# Patient Record
Sex: Female | Born: 1990 | Race: Black or African American | Hispanic: No | Marital: Single | Smoking: Former smoker
Health system: Southern US, Community
[De-identification: ages and names within clinical notes are randomized; demographics above are authoritative.]

## PROBLEM LIST (undated history)

## (undated) ENCOUNTER — Inpatient Hospital Stay (HOSPITAL_COMMUNITY): Payer: Self-pay

## (undated) DIAGNOSIS — E669 Obesity, unspecified: Secondary | ICD-10-CM

## (undated) DIAGNOSIS — F32A Depression, unspecified: Secondary | ICD-10-CM

## (undated) DIAGNOSIS — D649 Anemia, unspecified: Secondary | ICD-10-CM

## (undated) DIAGNOSIS — G43909 Migraine, unspecified, not intractable, without status migrainosus: Secondary | ICD-10-CM

## (undated) DIAGNOSIS — K5909 Other constipation: Secondary | ICD-10-CM

## (undated) DIAGNOSIS — F329 Major depressive disorder, single episode, unspecified: Secondary | ICD-10-CM

## (undated) DIAGNOSIS — G8929 Other chronic pain: Secondary | ICD-10-CM

## (undated) DIAGNOSIS — A549 Gonococcal infection, unspecified: Secondary | ICD-10-CM

## (undated) DIAGNOSIS — Z8619 Personal history of other infectious and parasitic diseases: Secondary | ICD-10-CM

## (undated) DIAGNOSIS — Z87440 Personal history of urinary (tract) infections: Secondary | ICD-10-CM

## (undated) DIAGNOSIS — A599 Trichomoniasis, unspecified: Secondary | ICD-10-CM

## (undated) DIAGNOSIS — I10 Essential (primary) hypertension: Secondary | ICD-10-CM

## (undated) DIAGNOSIS — A749 Chlamydial infection, unspecified: Secondary | ICD-10-CM

## (undated) HISTORY — DX: Other constipation: K59.09

## (undated) HISTORY — PX: WISDOM TOOTH EXTRACTION: SHX21

## (undated) HISTORY — DX: Personal history of urinary (tract) infections: Z87.440

## (undated) HISTORY — DX: Obesity, unspecified: E66.9

## (undated) HISTORY — DX: Major depressive disorder, single episode, unspecified: F32.9

## (undated) HISTORY — DX: Other chronic pain: G89.29

## (undated) HISTORY — DX: Depression, unspecified: F32.A

## (undated) HISTORY — DX: Personal history of other infectious and parasitic diseases: Z86.19

## (undated) HISTORY — PX: INDUCED ABORTION: SHX677

## (undated) HISTORY — PX: REMOVAL OF IMPLANON ROD: OBO 1006

## (undated) HISTORY — DX: Trichomoniasis, unspecified: A59.9

## (undated) HISTORY — DX: Essential (primary) hypertension: I10

---
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
## 2011-01-05 ENCOUNTER — Other Ambulatory Visit: Payer: Self-pay | Admitting: Family Medicine

## 2011-01-05 ENCOUNTER — Inpatient Hospital Stay (INDEPENDENT_AMBULATORY_CARE_PROVIDER_SITE_OTHER)
Admission: RE | Admit: 2011-01-05 | Discharge: 2011-01-05 | Disposition: A | Discharge status: Home / Self Care | Payer: Medicaid Other | Source: Ambulatory Visit | Attending: Family Medicine | Admitting: Family Medicine

## 2011-01-05 DIAGNOSIS — N899 Noninflammatory disorder of vagina, unspecified: Secondary | ICD-10-CM

## 2011-01-05 LAB — POCT URINALYSIS DIP (DEVICE)
Ketones, ur: NEGATIVE mg/dL
Protein, ur: 30 mg/dL — AB
Specific Gravity, Urine: 1.01 (ref 1.005–1.030)

## 2011-01-05 LAB — WET PREP, GENITAL
Trich, Wet Prep: NONE SEEN
Yeast Wet Prep HPF POC: NONE SEEN

## 2011-01-06 LAB — GC/CHLAMYDIA PROBE AMP, GENITAL
Chlamydia, DNA Probe: NEGATIVE
GC Probe Amp, Genital: NEGATIVE

## 2011-01-17 ENCOUNTER — Emergency Department (HOSPITAL_COMMUNITY)
Admission: EM | Admit: 2011-01-17 | Discharge: 2011-01-17 | Disposition: A | Discharge status: Home / Self Care | Payer: Medicaid Other | Attending: Emergency Medicine | Admitting: Emergency Medicine

## 2011-01-17 DIAGNOSIS — J029 Acute pharyngitis, unspecified: Secondary | ICD-10-CM | POA: Insufficient documentation

## 2011-01-17 DIAGNOSIS — J45909 Unspecified asthma, uncomplicated: Secondary | ICD-10-CM | POA: Insufficient documentation

## 2011-01-22 ENCOUNTER — Emergency Department (HOSPITAL_COMMUNITY)
Admission: EM | Admit: 2011-01-22 | Discharge: 2011-01-22 | Disposition: A | Discharge status: Home / Self Care | Payer: Medicaid Other | Attending: Emergency Medicine | Admitting: Emergency Medicine

## 2011-01-22 DIAGNOSIS — R059 Cough, unspecified: Secondary | ICD-10-CM | POA: Insufficient documentation

## 2011-01-22 DIAGNOSIS — R0609 Other forms of dyspnea: Secondary | ICD-10-CM | POA: Insufficient documentation

## 2011-01-22 DIAGNOSIS — R05 Cough: Secondary | ICD-10-CM | POA: Insufficient documentation

## 2011-01-22 DIAGNOSIS — B9789 Other viral agents as the cause of diseases classified elsewhere: Secondary | ICD-10-CM | POA: Insufficient documentation

## 2011-01-22 DIAGNOSIS — R5381 Other malaise: Secondary | ICD-10-CM | POA: Insufficient documentation

## 2011-01-22 DIAGNOSIS — N390 Urinary tract infection, site not specified: Secondary | ICD-10-CM | POA: Insufficient documentation

## 2011-01-22 DIAGNOSIS — J45909 Unspecified asthma, uncomplicated: Secondary | ICD-10-CM | POA: Insufficient documentation

## 2011-01-22 DIAGNOSIS — R112 Nausea with vomiting, unspecified: Secondary | ICD-10-CM | POA: Insufficient documentation

## 2011-01-22 DIAGNOSIS — R6883 Chills (without fever): Secondary | ICD-10-CM | POA: Insufficient documentation

## 2011-01-22 DIAGNOSIS — M549 Dorsalgia, unspecified: Secondary | ICD-10-CM | POA: Insufficient documentation

## 2011-01-22 DIAGNOSIS — R0982 Postnasal drip: Secondary | ICD-10-CM | POA: Insufficient documentation

## 2011-01-22 DIAGNOSIS — J3489 Other specified disorders of nose and nasal sinuses: Secondary | ICD-10-CM | POA: Insufficient documentation

## 2011-01-22 DIAGNOSIS — R000 Tachycardia, unspecified: Secondary | ICD-10-CM | POA: Insufficient documentation

## 2011-01-22 DIAGNOSIS — R5383 Other fatigue: Secondary | ICD-10-CM | POA: Insufficient documentation

## 2011-01-22 DIAGNOSIS — R0989 Other specified symptoms and signs involving the circulatory and respiratory systems: Secondary | ICD-10-CM | POA: Insufficient documentation

## 2011-01-22 DIAGNOSIS — R070 Pain in throat: Secondary | ICD-10-CM | POA: Insufficient documentation

## 2011-01-22 LAB — URINALYSIS, ROUTINE W REFLEX MICROSCOPIC
Glucose, UA: NEGATIVE mg/dL
Specific Gravity, Urine: 1.008 (ref 1.005–1.030)

## 2011-01-22 LAB — URINE MICROSCOPIC-ADD ON

## 2011-01-22 LAB — POCT PREGNANCY, URINE: Preg Test, Ur: NEGATIVE

## 2011-02-27 NOTE — H&P (Signed)
Margaret Cline, Margaret Cline                ACCOUNT NO.:  [id number]   MEDICAL RECORD NO.:  [id number]          PATIENT TYPE:  INP   LOCATION:  9165                          FACILITY:  [facility]   PHYSICIAN:  Janine Limbo, M.D.DATE OF BIRTH:  Apr 04, 1991   DATE OF ADMISSION:  05/26/2007  DATE OF DISCHARGE:                              HISTORY & PHYSICAL   Margaret Cline is a 20 year old gravida 1 para 0 at 17 and 3/7 weeks who  presented complaining of spontaneous ruptured membranes at approximately  9:50 p.m. tonight, with questionable light yellow tinge to fluid.  She  denies uterine contractions and reports positive fetal movement.  Pregnancy is remarkable for:  1. Age 69.  2. First trimester chlamydia with a negative test of cure.  3. Group B strep negative.   PRENATAL LABS:  Blood type is O positive, RH antibody negative, VDRL  nonreactive, Rubella titer positive, hepatitis B surface antigen  negative, HIV was nonreactive, varicella titer was negative, cystic  fibrosis testing was declined, sickle cell test was negative.  GC and  chlamydia cultures in the first trimester: GC was negative, chlamydia  was positive.  Followup test of cure at 19 weeks was negative and  negative GC  and chlamydia cultures were noted at 36 weeks.  She also  had a negative group B strep culture.  She was too late for care for  first trimester screening, quadruple screen was not done.  GC and  chlamydia cultures were negative again at 25 weeks.  She had a normal 1-  hour Glucola, hemoglobin upon entering to practice was 11.7.   HISTORY OF PRESENT PREGNANCY:  The patient entered care at approximately  13 weeks.  She had had an early ultrasound in December for dating  purposes and gave an EDC of 05/23/2007.  Varicella titer was done at her  first visit secondary to question of history.  This was noted to be  negative so she is therefore non-immune to varicella.  She had chlamydia  diagnosed at  her new OB visit.  She  was treated but threw it up.  She  then repeated it and was able to take it without vomiting.  Test of cure  was done and was negative.  She had an ultrasound at 19 weeks for normal  growth and development with an anterior placenta.  She was treated for  BV at 19 weeks.  She was having some significant stress in her home life  at 23 weeks.  Evidently her uncle has formal custody but the patient was  not having any access to any funds for herself.  The patient generally  stays with her mother, although her mother does not have formal custody.  She had some itching at 25 weeks.  GC and chlamydia cultures were  negative at that time.  She was having some back spasms at 27 weeks.  She had normal Glucola.  She had some irregular contractions at 33  weeks.  She had negative group B strep and negative cultures at 36 to 37  weeks.  She was seen in  the office this week and cervix was noted to be  a loose 1 cm.   OBSTETRICAL HISTORY:  The patient is a primigravida.   MEDICAL HISTORY:  She is a previous condom user.  She was diagnosed with  an abnormal Pap in December of 2007.  This was evidently attributed to  being treated for chlamydia and Trichomonas.  Both of these were noted  to be cleared.  She reports usual childhood illnesses.  She did not have  a history of chicken pox.  Varicella titer was done which was negative.  She did have cyst under her right arm that had to be removed in the  past.   ALLERGIES:  No known drug allergies.   FAMILY HISTORY:  There is a strong family history of hypertension on her  mother's side.  The patient's mother has had a history of stroke.  Her  sister has a history of depression.  The patient's uncle, mother and  sister are smokers.  Genetic history is unremarkable.   SOCIAL HISTORY:  The patient is single.  The father of the baby has been  sporadically involved.  The patient is African-American and of Christian  faith.  She is a 9th grade student.  She  has been living with her mother  although her uncle has custody.  There have been some issues with  financial support for this patient.  Her partner has an 8th grade  education.  The patient denies any alcohol, drug or tobacco use during  this pregnancy.  She has been followed by the Certified Nurse Midwife  service and [facility].   PHYSICAL EXAMINATION:  Vital signs are stable.  The patient is afebrile.  HEENT:  Within normal limits.  LUNGS:  Breath sounds are clear.  HEART:  Regular rate and rhythm, without murmur.  BREASTS:  Soft and nontender  ABDOMEN:  Fundal height is approximately 38 cm.  Estimated fetal weight  is 7-8 pounds.  Uterine contractions are very occasional and mild.  The  patient is noted to be leaking a small amount of yellowish amniotic  fluid.  CERVICAL EXAM:  1 cm 62% vertex and a -2 station.  Fetal heart rate is  reassuring with 10 beat accelerations noted.  No decelerations are  noted.  There are very occasional uterine contractions noted that are  very mild.  EXTREMITIES:  Deep tendon reflexes are 2+ without clonus.  There is a  trace edema noted.   IMPRESSION:  1. Intrauterine pregnancy at 40 and 3/7 weeks.  2. Spontaneous ruptured membranes prior to the onset of labor.  3. Light meconium stained fluid.  4. Group B strep negative.   PLAN:  1. Admit birthing suite for consult with Dr. Stefano Gaul as attending      physician.  2. Routine Certified Nurse Midwife orders.  3. Plan to observe for labor onset through the night, but if no change      by the morning we will plan Pitocin augmentation.  4. The patient prefers to use IV pain medication versus an epidural as      labor progresses.  5. She does have a Development worker, international aid.  This doula will come with the      onset of more active labor.      Renaldo Reel Emilee Hero, C.N.M.      Janine Limbo, M.D.  Electronically Signed    VLL/MEDQ  D:  05/26/2007  T:  05/27/2007  Job:  [id number]

## 2011-07-18 ENCOUNTER — Emergency Department (HOSPITAL_COMMUNITY)
Admission: EM | Admit: 2011-07-18 | Discharge: 2011-07-19 | Discharge status: Against Medical Advice | Payer: Medicaid Other | Source: Home / Self Care | Attending: Emergency Medicine | Admitting: Emergency Medicine

## 2011-07-18 DIAGNOSIS — IMO0001 Reserved for inherently not codable concepts without codable children: Secondary | ICD-10-CM | POA: Insufficient documentation

## 2011-07-18 DIAGNOSIS — R0989 Other specified symptoms and signs involving the circulatory and respiratory systems: Secondary | ICD-10-CM | POA: Insufficient documentation

## 2011-07-18 DIAGNOSIS — R51 Headache: Secondary | ICD-10-CM | POA: Insufficient documentation

## 2011-07-19 ENCOUNTER — Ambulatory Visit (HOSPITAL_COMMUNITY)
Admission: EM | Admit: 2011-07-19 | Discharge: 2011-07-19 | Discharge status: Against Medical Advice | Payer: Medicaid Other | Attending: Emergency Medicine | Admitting: Emergency Medicine

## 2011-07-19 DIAGNOSIS — IMO0001 Reserved for inherently not codable concepts without codable children: Secondary | ICD-10-CM | POA: Insufficient documentation

## 2011-07-19 DIAGNOSIS — R51 Headache: Secondary | ICD-10-CM | POA: Insufficient documentation

## 2011-07-30 LAB — CBC
HCT: 28.8 — ABNORMAL LOW
Hemoglobin: 10.6 — ABNORMAL LOW
MCHC: 32.9
MCV: 82.9
MCV: 85
Platelets: 227
RBC: 3.87
RDW: 16.3 — ABNORMAL HIGH
RDW: 16.4 — ABNORMAL HIGH

## 2011-08-02 LAB — URINALYSIS, ROUTINE W REFLEX MICROSCOPIC
Bilirubin Urine: NEGATIVE
Ketones, ur: NEGATIVE
Nitrite: NEGATIVE
Protein, ur: NEGATIVE
Urobilinogen, UA: 1

## 2011-08-15 ENCOUNTER — Inpatient Hospital Stay (HOSPITAL_COMMUNITY)
Admission: AD | Admit: 2011-08-15 | Discharge: 2011-08-15 | Disposition: A | Discharge status: Home / Self Care | Payer: Medicaid Other | Source: Ambulatory Visit | Attending: Obstetrics and Gynecology | Admitting: Obstetrics and Gynecology

## 2011-08-15 ENCOUNTER — Encounter (HOSPITAL_COMMUNITY): Payer: Self-pay | Admitting: *Deleted

## 2011-08-15 DIAGNOSIS — N912 Amenorrhea, unspecified: Secondary | ICD-10-CM | POA: Insufficient documentation

## 2011-08-15 NOTE — ED Provider Notes (Signed)
History     CSN: [id number] Arrival date & time: 08/15/2011  9:23 PM   None     Chief Complaint  Patient presents with  . Possible Pregnancy    HPI Margaret Cline is a 20 y.o. female who presents to [facility] for pregnancy test. She took a HPT and it was negative. Her PCP has referred her to a GYN but the appointment is a few weeks away and she wanted to come and have the test repeated. She denies pain or any problems. LMP was 06/21/11.   No past medical history on file.  No past surgical history on file.  No family history on file.  History  Substance Use Topics  . Smoking status: Not on file  . Smokeless tobacco: Not on file  . Alcohol Use: Not on file    OB History    Grav Para Term Preterm Abortions TAB SAB Ect Mult Living   1               Review of Systems  Allergies  Review of patient's allergies indicates not on file.  Home Medications  No current outpatient prescriptions on file.  BP 114/61  Pulse 87  Temp(Src) 98.2 F (36.8 C) (Oral)  Resp 20  Ht 5\' 8"  (1.727 m)  Wt 242 lb (109.77 kg)  BMI 36.80 kg/m2  LMP 06/21/2011  Physical Exam  Nursing note and vitals reviewed. Constitutional: She is oriented to person, place, and time. She appears well-developed and well-nourished. No distress.  HENT:  Head: Normocephalic.  Neck: Neck supple.  Cardiovascular: Normal rate.   Pulmonary/Chest: Effort normal.  Neurological: She is alert and oriented to person, place, and time.  Psychiatric: She has a normal mood and affect. Her behavior is normal.   Assessment: Amenorrhea  Plan:  Follow up with GYN as scheduled   Discharge instructions on amenorrhea ED Course  Procedures    MDM          Kerrie Buffalo, NP 08/15/11 2235

## 2011-08-15 NOTE — Progress Notes (Signed)
Pt refused any other treatment-states she just wanted to know if she was pregnant-and wants to go home

## 2011-08-16 NOTE — ED Provider Notes (Signed)
Attestation of Attending Supervision of Advanced Practitioner: Evaluation and management procedures were performed by the PA/NP/CNM/OB Fellow under my supervision/collaboration. Chart reviewed and agree with management and plan.  Tilda Burrow 08/16/2011 12:38 PM

## 2011-09-05 ENCOUNTER — Encounter (HOSPITAL_COMMUNITY): Payer: Self-pay | Admitting: *Deleted

## 2011-09-05 ENCOUNTER — Emergency Department (HOSPITAL_COMMUNITY): Payer: No Typology Code available for payment source

## 2011-09-05 ENCOUNTER — Emergency Department (HOSPITAL_COMMUNITY)
Admission: EM | Admit: 2011-09-05 | Discharge: 2011-09-05 | Disposition: A | Discharge status: Home / Self Care | Payer: No Typology Code available for payment source | Attending: Emergency Medicine | Admitting: Emergency Medicine

## 2011-09-05 DIAGNOSIS — J45909 Unspecified asthma, uncomplicated: Secondary | ICD-10-CM | POA: Insufficient documentation

## 2011-09-05 DIAGNOSIS — S139XXA Sprain of joints and ligaments of unspecified parts of neck, initial encounter: Secondary | ICD-10-CM | POA: Insufficient documentation

## 2011-09-05 DIAGNOSIS — S161XXA Strain of muscle, fascia and tendon at neck level, initial encounter: Secondary | ICD-10-CM

## 2011-09-05 DIAGNOSIS — S93401A Sprain of unspecified ligament of right ankle, initial encounter: Secondary | ICD-10-CM

## 2011-09-05 DIAGNOSIS — S8000XA Contusion of unspecified knee, initial encounter: Secondary | ICD-10-CM | POA: Insufficient documentation

## 2011-09-05 DIAGNOSIS — R209 Unspecified disturbances of skin sensation: Secondary | ICD-10-CM | POA: Insufficient documentation

## 2011-09-05 DIAGNOSIS — Y92410 Unspecified street and highway as the place of occurrence of the external cause: Secondary | ICD-10-CM | POA: Insufficient documentation

## 2011-09-05 DIAGNOSIS — S20219A Contusion of unspecified front wall of thorax, initial encounter: Secondary | ICD-10-CM | POA: Insufficient documentation

## 2011-09-05 DIAGNOSIS — Y998 Other external cause status: Secondary | ICD-10-CM | POA: Insufficient documentation

## 2011-09-05 DIAGNOSIS — M549 Dorsalgia, unspecified: Secondary | ICD-10-CM | POA: Insufficient documentation

## 2011-09-05 DIAGNOSIS — S93409A Sprain of unspecified ligament of unspecified ankle, initial encounter: Secondary | ICD-10-CM | POA: Insufficient documentation

## 2011-09-05 DIAGNOSIS — S8001XA Contusion of right knee, initial encounter: Secondary | ICD-10-CM

## 2011-09-05 MED ORDER — METOCLOPRAMIDE HCL 10 MG PO TABS: 10.0000 mg | ORAL_TABLET | Freq: Four times a day (QID) | ORAL | Status: DC | PRN

## 2011-09-05 MED ORDER — HYDROMORPHONE HCL PF 2 MG/ML IJ SOLN
2.0000 mg | Freq: Once | INTRAMUSCULAR | Status: AC
  Administered 2011-09-05: 2 mg via INTRAMUSCULAR
  Filled 2011-09-05: qty 1

## 2011-09-05 MED ORDER — OXYCODONE-ACETAMINOPHEN 5-325 MG PO TABS: 2.0000 | ORAL_TABLET | ORAL | Status: AC | PRN

## 2011-09-05 MED ORDER — ONDANSETRON 8 MG PO TBDP
8.0000 mg | ORAL_TABLET | Freq: Once | ORAL | Status: AC
  Administered 2011-09-05: 8 mg via ORAL
  Filled 2011-09-05: qty 1

## 2011-09-05 MED ORDER — ONDANSETRON 8 MG PO TBDP: ORAL_TABLET | ORAL | Status: AC
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
## 2011-09-05 NOTE — ED Notes (Signed)
ASO and crutches applied by ortho tech
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
## 2011-09-05 NOTE — ED Notes (Signed)
Patient transported to X-ray 

## 2011-09-05 NOTE — ED Notes (Signed)
Collar removed by md

## 2011-10-14 ENCOUNTER — Emergency Department (HOSPITAL_COMMUNITY)
Admission: EM | Admit: 2011-10-14 | Discharge: 2011-10-14 | Disposition: A | Discharge status: Home / Self Care | Payer: Medicaid Other | Attending: Emergency Medicine | Admitting: Emergency Medicine

## 2011-10-14 ENCOUNTER — Encounter (HOSPITAL_COMMUNITY): Payer: Self-pay | Admitting: *Deleted

## 2011-10-14 DIAGNOSIS — Z79899 Other long term (current) drug therapy: Secondary | ICD-10-CM | POA: Insufficient documentation

## 2011-10-14 DIAGNOSIS — R51 Headache: Secondary | ICD-10-CM | POA: Insufficient documentation

## 2011-10-14 DIAGNOSIS — R112 Nausea with vomiting, unspecified: Secondary | ICD-10-CM | POA: Insufficient documentation

## 2011-10-14 HISTORY — DX: Migraine, unspecified, not intractable, without status migrainosus: G43.909

## 2011-10-14 LAB — URINALYSIS, ROUTINE W REFLEX MICROSCOPIC
Bilirubin Urine: NEGATIVE
Glucose, UA: NEGATIVE mg/dL
Hgb urine dipstick: NEGATIVE
Ketones, ur: NEGATIVE mg/dL
Protein, ur: NEGATIVE mg/dL

## 2011-10-14 LAB — PREGNANCY, URINE: Preg Test, Ur: NEGATIVE

## 2011-10-14 MED ORDER — PROMETHAZINE HCL 25 MG PO TABS: 25.0000 mg | ORAL_TABLET | Freq: Four times a day (QID) | ORAL | Status: DC | PRN

## 2011-10-14 MED ORDER — ACETAMINOPHEN 500 MG PO TABS
1000.0000 mg | ORAL_TABLET | Freq: Once | ORAL | Status: AC
  Administered 2011-10-14: 1000 mg via ORAL
  Filled 2011-10-14: qty 2

## 2011-10-14 MED ORDER — NAPROXEN 500 MG PO TABS: 500.0000 mg | ORAL_TABLET | Freq: Two times a day (BID) | ORAL | Status: DC

## 2011-10-14 MED ORDER — METOCLOPRAMIDE HCL 5 MG/ML IJ SOLN
10.0000 mg | Freq: Once | INTRAMUSCULAR | Status: AC
  Administered 2011-10-14: 10 mg via INTRAMUSCULAR
  Filled 2011-10-14: qty 2

## 2011-10-14 MED ORDER — ONDANSETRON 8 MG PO TBDP
8.0000 mg | ORAL_TABLET | Freq: Once | ORAL | Status: AC
  Administered 2011-10-14: 8 mg via ORAL
  Filled 2011-10-14: qty 1

## 2011-10-14 MED ORDER — KETOROLAC TROMETHAMINE 60 MG/2ML IM SOLN
60.0000 mg | Freq: Once | INTRAMUSCULAR | Status: AC
  Administered 2011-10-14: 60 mg via INTRAMUSCULAR
  Filled 2011-10-14: qty 2

## 2011-10-14 NOTE — ED Provider Notes (Signed)
History     CSN: [id number]  Arrival date & time 10/14/11  0205   First MD Initiated Contact with Patient 10/14/11 [phone]      Chief Complaint  Patient presents with  . Nausea  . Emesis  . Headache    (Consider location/radiation/quality/duration/timing/severity/associated sxs/prior treatment) HPI Comments: Patient with history of migraine headaches for which she takes Topamax and has been referred to a neurologist.  She states that she has had a headache for several days, associated with nausea and vomiting but no other symptoms. The nausea and vomiting has been persistent, mild, not associated with abdominal pain, vaginal discharge or bleeding. She states that her last menstrual period was 6 weeks ago. She has not been using protection during sexual intercourse. She denies swelling, sore throat, cough, visual changes, back pain, diarrhea, rash  Patient is a 20 y.o. female presenting with vomiting and headaches. The history is provided by the patient.  Emesis  Associated symptoms include headaches.  Headache  Associated symptoms include vomiting.    Past Medical History  Diagnosis Date  . Asthma   . Migraines     No past surgical history on file.  No family history on file.  History  Substance Use Topics  . Smoking status: Never Smoker   . Smokeless tobacco: Not on file  . Alcohol Use: No    OB History    Grav Para Term Preterm Abortions TAB SAB Ect Mult Living   1               Review of Systems  Gastrointestinal: Positive for vomiting.  Neurological: Positive for headaches.  All other systems reviewed and are negative.    Allergies  Review of patient's allergies indicates no known allergies.  Home Medications   Current Outpatient Rx  Name Route Sig Dispense Refill  . ALBUTEROL SULFATE HFA 108 (90 BASE) MCG/ACT IN AERS Inhalation Inhale 2 puffs into the lungs every 6 (six) hours as needed.      . TOPIRAMATE 25 MG PO TABS Oral Take 25 mg by mouth 2  (two) times daily.      Marland Kitchen NAPROXEN 500 MG PO TABS Oral Take 1 tablet (500 mg total) by mouth 2 (two) times daily with a meal. 30 tablet 0  . PROMETHAZINE HCL 25 MG PO TABS Oral Take 1 tablet (25 mg total) by mouth every 6 (six) hours as needed for nausea. 12 tablet 0    BP 124/81  Pulse 78  Temp(Src) 98 F (36.7 C) (Oral)  Resp 18  SpO2 100%  LMP 08/07/2011  Breastfeeding? No  Physical Exam  Nursing note and vitals reviewed. Constitutional: She appears well-developed and well-nourished. No distress.  HENT:  Head: Normocephalic and atraumatic.  Mouth/Throat: Oropharynx is clear and moist. No oropharyngeal exudate.  Eyes: Conjunctivae and EOM are normal. Pupils are equal, round, and reactive to light. Right eye exhibits no discharge. Left eye exhibits no discharge. No scleral icterus.  Neck: Normal range of motion. Neck supple. No JVD present. No thyromegaly present.  Cardiovascular: Normal rate, regular rhythm, normal heart sounds and intact distal pulses.  Exam reveals no gallop and no friction rub.   No murmur heard. Pulmonary/Chest: Effort normal and breath sounds normal. No respiratory distress. She has no wheezes. She has no rales.  Abdominal: Soft. Bowel sounds are normal. She exhibits no distension and no mass. There is no tenderness.  Musculoskeletal: Normal range of motion. She exhibits no edema and no tenderness.  Lymphadenopathy:  She has no cervical adenopathy.  Neurological: She is alert. Coordination normal.  Skin: Skin is warm and dry. No rash noted. No erythema.  Psychiatric: She has a normal mood and affect. Her behavior is normal.    ED Course  Procedures (including critical care time)  Labs Reviewed  URINALYSIS, ROUTINE W REFLEX MICROSCOPIC - Abnormal; Notable for the following:    APPearance CLOUDY (*)    Specific Gravity, Urine 1.031 (*)    All other components within normal limits  PREGNANCY, URINE   No results found.   1. Headache   2. Nausea  and vomiting       MDM  Patient appears well other than mild nausea, has vital signs which are normal, exam which is unremarkable. Will treat with acetaminophen until pregnancy test results are back. Also check urinalysis for UTI.  No fever, stiff neck or meningismus.  Vital signs are normal with no fever, tachycardia or respiratory distress  Urinalysis normal, no signs of pregnancy. Patient has improved significantly after medications including  Zofran Toradol Reglan  Patient requesting by mouth fluids, tolerated without difficulty, will discharge home.  Discharge prescriptions  #1 Phenergan #2 Naprosyn  [place] encouraged to return immediately for severe or worsening symptoms       Vida Roller, MD 10/14/11 [phone]

## 2011-10-14 NOTE — ED Notes (Signed)
Pt has hx of headaches for which she takes Topamax. PT states headache started yesterday morning. Pt vomiting x 4 prior to arrival and x 1 in triage.

## 2011-10-16 NOTE — L&D Delivery Note (Signed)
Delivery Note At 1:50 PM a viable female (Margaret Cline) was delivered via NSVD (Presentation:vertex ;loa  ).  APGAR: 9, 9; weight 8lbs 1oz .   Placenta status:delivered spont intact.  One and a half minute shoulder dystocia was noted with McRoberts, Suprapubic and Wood's Corkscrew and delivery of anterior shoulder by placing index finger beneath armpit.  Pt at this point ws having trouble pushing secondary to being tired.  A pop was heard but unclear where it originated.  The right clavicle (ant shoulder was rt shoulder) felt intact by Melissa, RN and infant was moving both arms vigorously.  Cord pH: n/a.  Cord blood donor and cord bloods to be sent to lab by cord blood bank team.  Anesthesia: Epidural  Episiotomy: none Lacerations: none Suture Repair: n/a Est. Blood Loss (mL): 300cc  Mom to postpartum.  Baby to nursery-stable.  Purcell Nails 07/09/2012, 1:53 PM

## 2011-10-30 ENCOUNTER — Inpatient Hospital Stay (HOSPITAL_COMMUNITY)
Admission: AD | Admit: 2011-10-30 | Discharge: 2011-10-30 | Disposition: A | Discharge status: Home / Self Care | Payer: Medicaid Other | Source: Ambulatory Visit | Attending: Obstetrics and Gynecology | Admitting: Obstetrics and Gynecology

## 2011-10-30 ENCOUNTER — Inpatient Hospital Stay (HOSPITAL_COMMUNITY): Payer: Medicaid Other

## 2011-10-30 ENCOUNTER — Encounter (HOSPITAL_COMMUNITY): Payer: Self-pay | Admitting: *Deleted

## 2011-10-30 DIAGNOSIS — R109 Unspecified abdominal pain: Secondary | ICD-10-CM | POA: Insufficient documentation

## 2011-10-30 DIAGNOSIS — O99891 Other specified diseases and conditions complicating pregnancy: Secondary | ICD-10-CM | POA: Insufficient documentation

## 2011-10-30 HISTORY — DX: Gonococcal infection, unspecified: A54.9

## 2011-10-30 HISTORY — DX: Chlamydial infection, unspecified: A74.9

## 2011-10-30 LAB — DIFFERENTIAL
Eosinophils Absolute: 0.1 10*3/uL (ref 0.0–0.7)
Eosinophils Relative: 1 % (ref 0–5)
Lymphocytes Relative: 27 % (ref 12–46)
Lymphs Abs: 2.7 10*3/uL (ref 0.7–4.0)
Monocytes Relative: 4 % (ref 3–12)

## 2011-10-30 LAB — CBC
HCT: 35.9 % — ABNORMAL LOW (ref 36.0–46.0)
Hemoglobin: 11.6 g/dL — ABNORMAL LOW (ref 12.0–15.0)
MCH: 27.5 pg (ref 26.0–34.0)
MCV: 85.1 fL (ref 78.0–100.0)
Platelets: 326 10*3/uL (ref 150–400)
RBC: 4.22 MIL/uL (ref 3.87–5.11)
WBC: 10.1 10*3/uL (ref 4.0–10.5)

## 2011-10-30 LAB — HCG, QUANTITATIVE, PREGNANCY: hCG, Beta Chain, Quant, S: 1285 m[IU]/mL — ABNORMAL HIGH (ref <5)

## 2011-10-30 LAB — URINALYSIS, ROUTINE W REFLEX MICROSCOPIC
Bilirubin Urine: NEGATIVE
Glucose, UA: NEGATIVE mg/dL
Hgb urine dipstick: NEGATIVE
Specific Gravity, Urine: 1.025 (ref 1.005–1.030)
Urobilinogen, UA: 0.2 mg/dL (ref 0.0–1.0)
pH: 6 (ref 5.0–8.0)

## 2011-10-30 LAB — URINE MICROSCOPIC-ADD ON

## 2011-10-30 LAB — ABO/RH: ABO/RH(D): O POS

## 2011-10-30 LAB — POCT PREGNANCY, URINE: Preg Test, Ur: POSITIVE

## 2011-10-30 MED ORDER — PRENATAL MULTIVITAMIN CH: 1.0000 | ORAL_TABLET | Freq: Every day | ORAL | Status: DC

## 2011-10-30 NOTE — ED Notes (Signed)
Manfred Arch informed that pt. did not sign in under [facility] and was in process of interview that patient admitted that she is a current pt. with [facility].

## 2011-10-30 NOTE — ED Provider Notes (Signed)
History     CSN: [id number]  Arrival date & time 10/30/11  1332   None     Chief Complaint  Patient presents with  . Abdominal Pain   HPI Margaret Cline is a 21 y.o. who presents to MAU for abdominal pain in early pregnancy. The cramping pain started a few days ago.   Past Medical History  Diagnosis Date  . Asthma   . Migraines     No past surgical history on file.  No family history on file.  History  Substance Use Topics  . Smoking status: Never Smoker   . Smokeless tobacco: Not on file  . Alcohol Use: No    OB History    Grav Para Term Preterm Abortions TAB SAB Ect Mult Living   2               Review of Systems  Allergies  Review of patient's allergies indicates no known allergies.  Home Medications  No current outpatient prescriptions on file.  BP 113/61  Pulse 92  Temp(Src) 98.4 F (36.9 C) (Oral)  Resp 16  Ht 5\' 7"  (1.702 m)  Wt 240 lb 12.8 oz (109.226 kg)  BMI 37.71 kg/m2  SpO2 99%  LMP 09/14/2011  Physical Exam  Nursing note and vitals reviewed. Constitutional: She is oriented to person, place, and time. She appears well-developed and well-nourished. No distress.  HENT:  Head: Normocephalic.  Eyes: EOM are normal.  Neck: Neck supple.  Cardiovascular: Normal rate.   Pulmonary/Chest: Effort normal.  Musculoskeletal: Normal range of motion.  Neurological: She is alert and oriented to person, place, and time. No cranial nerve deficit.  Skin: Skin is warm and dry.  Psychiatric: She has a normal mood and affect. Her behavior is normal. Judgment and thought content normal.   Results for orders placed during the hospital encounter of 10/30/11 (from the past 24 hour(s))  URINALYSIS, ROUTINE W REFLEX MICROSCOPIC     Status: Abnormal   Collection Time   10/30/11  2:15 PM      Component Value Range   Color, Urine YELLOW  YELLOW    APPearance HAZY (*) CLEAR    Specific Gravity, Urine 1.025  1.005 - 1.030    pH 6.0  5.0 - 8.0    Glucose, UA  NEGATIVE  NEGATIVE (mg/dL)   Hgb urine dipstick NEGATIVE  NEGATIVE    Bilirubin Urine NEGATIVE  NEGATIVE    Ketones, ur NEGATIVE  NEGATIVE (mg/dL)   Protein, ur NEGATIVE  NEGATIVE (mg/dL)   Urobilinogen, UA 0.2  0.0 - 1.0 (mg/dL)   Nitrite NEGATIVE  NEGATIVE    Leukocytes, UA TRACE (*) NEGATIVE   URINE MICROSCOPIC-ADD ON     Status: Abnormal   Collection Time   10/30/11  2:15 PM      Component Value Range   Squamous Epithelial / LPF MANY (*) RARE    WBC, UA 0-2  <3 (WBC/hpf)   RBC / HPF 0-2  <3 (RBC/hpf)   Bacteria, UA RARE  RARE   POCT PREGNANCY, URINE     Status: Normal   Collection Time   10/30/11  2:29 PM      Component Value Range   Preg Test, Ur POSITIVE     CBC, Bhcg, and ultrasound pending  ED Course: Medical screening exam completed and patient is stable to await private provider.   Procedures  MDM          Kerrie Buffalo, NP 10/30/11 [phone]

## 2011-10-30 NOTE — ED Provider Notes (Signed)
See Mayer Camel, FNP, note for general discussion of patient's complaints and general history. Patient has an appointment for NOB work-up at [facility] in early February, with last gyn appointment in December. See 12/30 at ER for headache--had negative pregnancy test at that time.  Currently in [place] from order of MAU provider  Results for orders placed during the hospital encounter of 10/30/11 (from the past 24 hour(s))  URINALYSIS, ROUTINE W REFLEX MICROSCOPIC     Status: Abnormal   Collection Time   10/30/11  2:15 PM      Component Value Range   Color, Urine YELLOW  YELLOW    APPearance HAZY (*) CLEAR    Specific Gravity, Urine 1.025  1.005 - 1.030    pH 6.0  5.0 - 8.0    Glucose, UA NEGATIVE  NEGATIVE (mg/dL)   Hgb urine dipstick NEGATIVE  NEGATIVE    Bilirubin Urine NEGATIVE  NEGATIVE    Ketones, ur NEGATIVE  NEGATIVE (mg/dL)   Protein, ur NEGATIVE  NEGATIVE (mg/dL)   Urobilinogen, UA 0.2  0.0 - 1.0 (mg/dL)   Nitrite NEGATIVE  NEGATIVE    Leukocytes, UA TRACE (*) NEGATIVE   URINE MICROSCOPIC-ADD ON     Status: Abnormal   Collection Time   10/30/11  2:15 PM      Component Value Range   Squamous Epithelial / LPF MANY (*) RARE    WBC, UA 0-2  <3 (WBC/hpf)   RBC / HPF 0-2  <3 (RBC/hpf)   Bacteria, UA RARE  RARE   POCT PREGNANCY, URINE     Status: Normal   Collection Time   10/30/11  2:29 PM      Component Value Range   Preg Test, Ur POSITIVE    HCG, QUANTITATIVE, PREGNANCY     Status: Abnormal   Collection Time   10/30/11  3:25 PM      Component Value Range   hCG, Beta Chain, Quant, S 1285 (*) <5 (mIU/mL)  CBC     Status: Abnormal   Collection Time   10/30/11  3:26 PM      Component Value Range   WBC 10.1  4.0 - 10.5 (K/uL)   RBC 4.22  3.87 - 5.11 (MIL/uL)   Hemoglobin 11.6 (*) 12.0 - 15.0 (g/dL)   HCT 47.8 (*) 29.5 - 46.0 (%)   MCV 85.1  78.0 - 100.0 (fL)   MCH 27.5  26.0 - 34.0 (pg)   MCHC 32.3  30.0 - 36.0 (g/dL)   RDW 62.1  30.8 - 65.7 (%)   Platelets 326  150 - 400 (K/uL)    DIFFERENTIAL     Status: Normal   Collection Time   10/30/11  3:26 PM      Component Value Range   Neutrophils Relative 68  43 - 77 (%)   Neutro Abs 6.9  1.7 - 7.7 (K/uL)   Lymphocytes Relative 27  12 - 46 (%)   Lymphs Abs 2.7  0.7 - 4.0 (K/uL)   Monocytes Relative 4  3 - 12 (%)   Monocytes Absolute 0.4  0.1 - 1.0 (K/uL)   Eosinophils Relative 1  0 - 5 (%)   Eosinophils Absolute 0.1  0.0 - 0.7 (K/uL)   Basophils Relative 0  0 - 1 (%)   Basophils Absolute 0.0  0.0 - 0.1 (K/uL)  ABO/RH     Status: Normal   Collection Time   10/30/11  3:29 PM      Component Value Range  ABO/RH(D) O POS      [place]:  IUGS at [redacted]w[redacted]d.  Pelvic:  No blood in vault.  Cervix closed, long.  Small amount white discharge.  GC, chlamydia, wet prep done (wet prep negative)  D/C'd home. Plan f/u US with patient's visit already scheduled on 2/7.  Office will call patient to coordinate. Rx PNV.  Nigel Bridgeman, CNM, [place] 10/30/11 4:16pm

## 2011-10-30 NOTE — Progress Notes (Signed)
Patient states she has been having lower abdominal pain for about one week. Has not had a period since 11-30.

## 2011-10-31 LAB — GC/CHLAMYDIA PROBE AMP, GENITAL: GC Probe Amp, Genital: NEGATIVE

## 2011-11-12 ENCOUNTER — Inpatient Hospital Stay (HOSPITAL_COMMUNITY)
Admission: AD | Admit: 2011-11-12 | Discharge: 2011-11-13 | Disposition: A | Discharge status: Home / Self Care | Payer: Medicaid Other | Source: Ambulatory Visit | Attending: Obstetrics and Gynecology | Admitting: Obstetrics and Gynecology

## 2011-11-12 ENCOUNTER — Encounter (HOSPITAL_COMMUNITY): Payer: Self-pay | Admitting: *Deleted

## 2011-11-12 DIAGNOSIS — K5900 Constipation, unspecified: Secondary | ICD-10-CM | POA: Insufficient documentation

## 2011-11-12 DIAGNOSIS — O210 Mild hyperemesis gravidarum: Secondary | ICD-10-CM | POA: Insufficient documentation

## 2011-11-12 DIAGNOSIS — O219 Vomiting of pregnancy, unspecified: Secondary | ICD-10-CM

## 2011-11-12 LAB — URINALYSIS, ROUTINE W REFLEX MICROSCOPIC
Bilirubin Urine: NEGATIVE
Hgb urine dipstick: NEGATIVE
Nitrite: NEGATIVE
Specific Gravity, Urine: >1.03 — ABNORMAL HIGH (ref 1.005–1.030)
Urobilinogen, UA: 1 mg/dL (ref 0.0–1.0)
pH: 6 (ref 5.0–8.0)

## 2011-11-12 MED ORDER — PANTOPRAZOLE SODIUM 40 MG IV SOLR
40.0000 mg | Freq: Once | INTRAVENOUS | Status: AC
Start: 2011-11-12 — End: 2011-11-13
  Administered 2011-11-13: 40 mg via INTRAVENOUS
  Filled 2011-11-12: qty 40

## 2011-11-12 MED ORDER — DEXTROSE 5 % IN LACTATED RINGERS IV BOLUS
1000.0000 mL | Freq: Once | INTRAVENOUS | Status: AC
  Administered 2011-11-12: 1000 mL via INTRAVENOUS

## 2011-11-12 MED ORDER — ONDANSETRON HCL 4 MG/2ML IJ SOLN
4.0000 mg | Freq: Once | INTRAMUSCULAR | Status: AC
  Administered 2011-11-13: 4 mg via INTRAVENOUS
  Filled 2011-11-12: qty 2

## 2011-11-12 NOTE — Progress Notes (Signed)
PT SAYS STARTED VOMITED   X4  DAYS . CALLED OFFICE  TODAY-  THEY   CALLED IN MED- PT  SAYS   THE PHARMACY  SAYS DID NOT HAVE MED.   FEELS CRAMPING.

## 2011-11-13 DIAGNOSIS — K5909 Other constipation: Secondary | ICD-10-CM

## 2011-11-13 HISTORY — DX: Other constipation: K59.09

## 2011-11-13 NOTE — ED Provider Notes (Signed)
History   Margaret Cline is a 21y.o. Single black female who presents unannounced with CC of n/v x4 days and unable to keep anything down.  Called the office and someone was suppose to call her in Phenergan (reports taking Zofran in December and "didn't work"), but when she called and went to the pharmacy, no Rx called in.  Last ate some "chicken and broccoli" around 2130.  Reports trying Brat diet and "clear liquids" and doesn't help.  Several questions r/e her "date of conception," and reports a "break-up" around the time she thinks she conceived, but she has a history of irregular/long cycles.  Report LMP 09/14/11, and prior to that period, LMP 08/07/11, but in past sometimes skips some months.  Pt reports being on birth control missed at time of conception, and reports compliance except missed one pill--she did double up the next day, but still conceived.   NOTE:  (A female visitor is sitting in chair away from bed w/ hoodie over head and head lying on counter beside sink and not interacting w/ conversation or her.) Pt has an almost 5y.o. Son, and has had one other pregnancy which she elected to terminate.  Pt denies any fever, UTI s/s, VB, abnl d/c.  She does report episode of diarrhea around 0300 and also throwing up at same time.  Prior to that BM, she doesn't recall last BM.  Does not have current inhaler, but reports emesis has caused her to feel more SOB, and tonight when she threw up, she noted blood.  She hasn't thrown up in MAU, but has spit/dry heave w/ just saliva as output.  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 She has an appt scheduled 11/21/11 and 11/22/11 per recall at [facility].  She was last seen in MAU 10/30/11 for lower abd cramping; had neg wet prep and gc/ct cx's, also had normal cbc and blood type Opos. Pt [facility]; has  history of migraines and followed in past by neurologist.    Ob u/s 10/30/11 showed: Intrauterine GS=3.68mm (4.5 weeks) giving EDC of 07/03/12, which would be variation from [facility] by LMP 06/20/12.  No embryo or YS seen and did have retroverted uterus w/ Moderate amt FF. Discussed w/ pt EDC cannot be determined until viability u/s done, and this is scheduled for 11/21/11.  Pregnancy r/f: 1.  Teen 2.  Asthma 3.  Migraines 4.  freq use of ED (3 visits in less than 1 month) 5.  Irregular/long cycles 6.  Chronic constipation 7.  Unplanned pregnancy w/ question of paternity 8.  H/o stds  No chief complaint on file.  HPI  OB History    Grav Para Term Preterm Abortions TAB SAB Ect Mult Living   3 1 1  0 1 1 0 0 0 1      Past Medical History  Diagnosis Date  . Asthma   . Migraines   . Chlamydia   . Gonorrhea     Past Surgical History  Procedure Date  . Wisdom tooth extraction     Family History  Problem Relation Age of Onset  . Anesthesia problems Neg Hx   . Hypotension Neg Hx   . Malignant hyperthermia Neg Hx   . Pseudochol deficiency Neg Hx     History  Substance Use Topics  .  Smoking status: Never Smoker   . Smokeless tobacco: Never Used  . Alcohol Use: No    Allergies: No Known Allergies  Prescriptions prior to admission  Medication Sig Dispense Refill  . albuterol (PROVENTIL HFA;VENTOLIN HFA) 108 (90 BASE) MCG/ACT inhaler Inhale 2 puffs into the lungs every 6 (six) hours as needed. For wheezing or shortness of breath      . Prenatal Vit-Fe Fumarate-FA (PRENATAL MULTIVITAMIN) TABS Take 1 tablet by mouth daily.  30 tablet  12    ROS--see history above Physical Exam   Blood pressure 115/73, pulse 92, temperature 98.8 F (37.1 C), temperature source Oral, resp. rate 18, height 5\' 6"  (1.676 m), weight 106.765 kg (235 lb 6 oz), last menstrual period 09/14/2011.  Physical Exam  Constitutional: She is oriented to person, place, and time. She appears well-developed and  well-nourished. No distress.  HENT:       glasses  Cardiovascular: Normal rate.   Respiratory: Effort normal.  GI: Soft. Bowel sounds are normal. She exhibits no distension and no mass. There is no tenderness. There is no rebound and no guarding.  Neurological: She is alert and oriented to person, place, and time.  Skin: Skin is warm and dry.    MAU Course  Procedures 1.  D5LR 2.  40mg  IV Protonix 3.  4mg  IV Zofran  Assessment and Plan  1.  IUP at 6.5 weeks 2.  N/V of pregnancy 3.  Teen/student 4.  Chronic constipation 5.  Recent asthma exacerbation w/ N/V  1.  D/c home after 1 Liter D5LR, Protonix and Zofran 2.  BRAT diet; numerous Rx given:  Zofran, Phenergan x 2 (po & supp), Florajen 3 Probiotic, Albuterol, Ensure clear, Protonix 3.  Sea bands also disc'd; sm freq meals; 4.  F/u 2/6 at [facility] or prn 5.  Note given to remain OOS until Wed 1/30  Lounette Sloan H 11/13/2011, 12:02 AM

## 2011-11-22 LAB — OB RESULTS CONSOLE ANTIBODY SCREEN: Antibody Screen: NEGATIVE

## 2011-12-12 ENCOUNTER — Other Ambulatory Visit (INDEPENDENT_AMBULATORY_CARE_PROVIDER_SITE_OTHER): Payer: Medicaid Other

## 2011-12-12 DIAGNOSIS — E669 Obesity, unspecified: Secondary | ICD-10-CM

## 2011-12-12 DIAGNOSIS — O99210 Obesity complicating pregnancy, unspecified trimester: Secondary | ICD-10-CM

## 2011-12-12 DIAGNOSIS — O208 Other hemorrhage in early pregnancy: Secondary | ICD-10-CM

## 2011-12-21 ENCOUNTER — Other Ambulatory Visit (INDEPENDENT_AMBULATORY_CARE_PROVIDER_SITE_OTHER): Payer: Medicaid Other

## 2011-12-21 DIAGNOSIS — Z36 Encounter for antenatal screening of mother: Secondary | ICD-10-CM

## 2011-12-23 ENCOUNTER — Inpatient Hospital Stay (HOSPITAL_COMMUNITY)
Admission: AD | Admit: 2011-12-23 | Discharge: 2011-12-23 | Disposition: A | Discharge status: Home Health | Payer: Medicaid Other | Source: Ambulatory Visit | Attending: Obstetrics and Gynecology | Admitting: Obstetrics and Gynecology

## 2011-12-23 ENCOUNTER — Encounter (HOSPITAL_COMMUNITY): Payer: Self-pay | Admitting: *Deleted

## 2011-12-23 DIAGNOSIS — O210 Mild hyperemesis gravidarum: Secondary | ICD-10-CM | POA: Insufficient documentation

## 2011-12-23 DIAGNOSIS — R197 Diarrhea, unspecified: Secondary | ICD-10-CM

## 2011-12-23 DIAGNOSIS — O219 Vomiting of pregnancy, unspecified: Secondary | ICD-10-CM

## 2011-12-23 LAB — COMPREHENSIVE METABOLIC PANEL
AST: 14 U/L (ref 0–37)
Albumin: 2.9 g/dL — ABNORMAL LOW (ref 3.5–5.2)
Alkaline Phosphatase: 44 U/L (ref 39–117)
BUN: 8 mg/dL (ref 6–23)
CO2: 26 mEq/L (ref 19–32)
Chloride: 99 mEq/L (ref 96–112)
Creatinine, Ser: 0.65 mg/dL (ref 0.50–1.10)
GFR calc non Af Amer: >90 mL/min (ref >90)
Potassium: 3.9 mEq/L (ref 3.5–5.1)
Total Bilirubin: 0.2 mg/dL — ABNORMAL LOW (ref 0.3–1.2)

## 2011-12-23 LAB — URINALYSIS, ROUTINE W REFLEX MICROSCOPIC
Leukocytes, UA: NEGATIVE
Protein, ur: NEGATIVE mg/dL
Specific Gravity, Urine: 1.01 (ref 1.005–1.030)
Urobilinogen, UA: 0.2 mg/dL (ref 0.0–1.0)

## 2011-12-23 LAB — DIFFERENTIAL
Basophils Relative: 0 % (ref 0–1)
Lymphocytes Relative: 36 % (ref 12–46)
Monocytes Absolute: 0.5 10*3/uL (ref 0.1–1.0)
Monocytes Relative: 6 % (ref 3–12)
Neutro Abs: 5 10*3/uL (ref 1.7–7.7)
Neutrophils Relative %: 57 % (ref 43–77)

## 2011-12-23 LAB — CBC
HCT: 35.9 % — ABNORMAL LOW (ref 36.0–46.0)
Hemoglobin: 11.8 g/dL — ABNORMAL LOW (ref 12.0–15.0)
RBC: 4.29 MIL/uL (ref 3.87–5.11)
WBC: 8.8 10*3/uL (ref 4.0–10.5)

## 2011-12-23 MED ORDER — PANTOPRAZOLE SODIUM 40 MG IV SOLR
40.0000 mg | Freq: Once | INTRAVENOUS | Status: AC
  Administered 2011-12-23: 40 mg via INTRAVENOUS
  Filled 2011-12-23: qty 40

## 2011-12-23 MED ORDER — ONDANSETRON HCL 4 MG/2ML IJ SOLN
4.0000 mg | Freq: Once | INTRAMUSCULAR | Status: AC
  Administered 2011-12-23: 4 mg via INTRAVENOUS
  Filled 2011-12-23: qty 2

## 2011-12-23 MED ORDER — LACTATED RINGERS IV BOLUS (SEPSIS)
1000.0000 mL | Freq: Once | INTRAVENOUS | Status: AC
  Administered 2011-12-23: 1000 mL via INTRAVENOUS

## 2011-12-23 NOTE — ED Provider Notes (Signed)
History   Margaret Cline is a 20y.o. Single black female who presents at 12.2 weeks with CC of n/v and loose stools today.  Reports 7 emesis episodes today.  Last ate and drank around 1900, and tried plain cooked spaghetti noodles.  Reports also trying crackers and yogurt.  Intake only stays down to max of 5 minutes before throws up.  Reports nauseated all day, despite premedicating w/ Zofran and Phenergan.  Reports n/v causing "headaches" to be more frequent.  Reports work-up prior to pregnancy r/e frequent headaches, and that spinal tap cancelled once she found out she was pregnant, but MRI was normal.  She hasn't followed up with her neurologist since.    History of irregular/long cycles. Report LMP 09/14/11, and prior to that period, LMP 08/07/11, but in past sometimes skips some months. Pt reports being on birth control missed at time of conception, and reports compliance except missed one pill--she did double up the next day, but still conceived.   Pt accompanied by her 5y.o. Son and her sister. Reports history of one other pregnancy which she elected to terminate. Pt denies any fever, UTI s/s, VB, abnl d/c. Pt reports voiding "less often," and also reports urinary IC w/ emesis episodes.  Denies any ptyalism.   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   She was last seen in MAU 10/30/11 for lower abd cramping; had neg wet prep and gc/ct cx's, also had normal cbc and blood type Opos. Seen for n/v on 11/12/11, and that was last MAU encounter until tonight. Pt seen [facility]; has history of migraines and followed in past by neurologist.   Pregnancy r/f: 1.  Obese 2.  Varicella nonimmune 3.  H/o CT and trich 4.  Asthma 5.  H/o PPD 6.  Paternity issues 7.  Persisting n/v of pregnancy  Chief Complaint  Patient presents with  . Emesis  During Pregnancy   HPI  OB History    Grav Para Term Preterm Abortions TAB SAB Ect Mult Living   3 1 1  0 1 1 0 0 0 1      Past Medical History  Diagnosis Date  . Asthma   . Migraines   . Chlamydia   . Gonorrhea   . No pertinent past medical history     Past Surgical History  Procedure Date  . Wisdom tooth extraction     Family History  Problem Relation Age of Onset  . Anesthesia problems Neg Hx   . Hypotension Neg Hx   . Malignant hyperthermia Neg Hx   . Pseudochol deficiency Neg Hx   . Hypertension Mother   . Asthma Mother   . Heart disease Mother   . Diabetes Mother     History  Substance Use Topics  . Smoking status: Never Smoker   . Smokeless tobacco: Never Used  . Alcohol Use: No    Allergies: No Known Allergies  Prescriptions prior to admission  Medication Sig Dispense Refill  . acetaminophen (TYLENOL) 500 MG tablet Take 1,000 mg by mouth every 6 (six) hours as needed. For pain      . albuterol (PROVENTIL HFA;VENTOLIN HFA) 108 (90 BASE) MCG/ACT inhaler Inhale 2 puffs into the lungs every 6 (six) hours as needed. For wheezing or shortness of breath      .  Ondansetron (ZOFRAN ODT PO) Take 1 tablet by mouth. For nausea      . Promethazine HCl (PHENERGAN PO) Take 1 tablet by mouth.        ROS--see history above Physical Exam  FHT's per triage RN WNL  Blood pressure 104/70, pulse 87, temperature 97.4 F (36.3 C), temperature source Oral, resp. rate 16, height 5\' 7"  (1.702 m), weight 105.325 kg (232 lb 3.2 oz), last menstrual period 09/14/2011.  Physical Exam  Constitutional: She is oriented to person, place, and time. She appears well-developed and well-nourished. No distress.  Eyes:       Glasses on  Cardiovascular: Normal rate.   Respiratory: Effort normal.  GI: Soft. Bowel sounds are normal. She exhibits no distension and no mass. There is no tenderness. There is no rebound and no guarding.       Gravid w/ FHT's per triage RN and WNL    Genitourinary:       deferred  Neurological: She is alert and oriented to person, place, and time.  Skin: Skin is warm and dry.       mucuous membranes moist  .. Results for orders placed during the hospital encounter of 12/23/11 (from the past 24 hour(s))  URINALYSIS, ROUTINE W REFLEX MICROSCOPIC     Status: Normal   Collection Time   12/23/11  8:54 PM      Component Value Range   Color, Urine YELLOW  YELLOW    APPearance CLEAR  CLEAR    Specific Gravity, Urine 1.010  1.005 - 1.030    pH 8.0  5.0 - 8.0    Glucose, UA NEGATIVE  NEGATIVE (mg/dL)   Hgb urine dipstick NEGATIVE  NEGATIVE    Bilirubin Urine NEGATIVE  NEGATIVE    Ketones, ur NEGATIVE  NEGATIVE (mg/dL)   Protein, ur NEGATIVE  NEGATIVE (mg/dL)   Urobilinogen, UA 0.2  0.0 - 1.0 (mg/dL)   Nitrite NEGATIVE  NEGATIVE    Leukocytes, UA NEGATIVE  NEGATIVE   CBC     Status: Abnormal   Collection Time   12/23/11  9:47 PM      Component Value Range   WBC 8.8  4.0 - 10.5 (K/uL)   RBC 4.29  3.87 - 5.11 (MIL/uL)   Hemoglobin 11.8 (*) 12.0 - 15.0 (g/dL)   HCT 16.1 (*) 09.6 - 46.0 (%)   MCV 83.7  78.0 - 100.0 (fL)   MCH 27.5  26.0 - 34.0 (pg)   MCHC 32.9  30.0 - 36.0 (g/dL)   RDW 04.5  40.9 - 81.1 (%)   Platelets 259  150 - 400 (K/uL)  DIFFERENTIAL     Status: Normal   Collection Time   12/23/11  9:47 PM      Component Value Range   Neutrophils Relative 57  43 - 77 (%)   Neutro Abs 5.0  1.7 - 7.7 (K/uL)   Lymphocytes Relative 36  12 - 46 (%)   Lymphs Abs 3.2  0.7 - 4.0 (K/uL)   Monocytes Relative 6  3 - 12 (%)   Monocytes Absolute 0.5  0.1 - 1.0 (K/uL)   Eosinophils Relative 1  0 - 5 (%)   Eosinophils Absolute 0.1  0.0 - 0.7 (K/uL)   Basophils Relative 0  0 - 1 (%)   Basophils Absolute 0.0  0.0 - 0.1 (K/uL)  COMPREHENSIVE METABOLIC PANEL     Status: Abnormal   Collection Time   12/23/11  9:47 PM  Component Value Range   Sodium 133 (*) 135 - 145 (mEq/L)   Potassium 3.9  3.5 - 5.1 (mEq/L)   Chloride 99  96 - 112  (mEq/L)   CO2 26  19 - 32 (mEq/L)   Glucose, Bld 79  70 - 99 (mg/dL)   BUN 8  6 - 23 (mg/dL)   Creatinine, Ser 1.61  0.50 - 1.10 (mg/dL)   Calcium 9.1  8.4 - 09.6 (mg/dL)   Total Protein 6.6  6.0 - 8.3 (g/dL)   Albumin 2.9 (*) 3.5 - 5.2 (g/dL)   AST 14  0 - 37 (U/L)   ALT 14  0 - 35 (U/L)   Alkaline Phosphatase 44  39 - 117 (U/L)   Total Bilirubin 0.2 (*) 0.3 - 1.2 (mg/dL)   GFR calc non Af Amer >90  >90 (mL/min)   GFR calc Af Amer >90  >90 (mL/min)  TSH=0.256 (L) Prealbumin=18.3 (WNL)  MAU Course  Procedures 1.  U/a 2.  Labs:  Cbc with diff, cmet, TSH, prealbumin 3.  One liter LR 4.  Zofran 4mg  IV x1 5. Protonix 40mg  IV x1  Assessment and Plan  1.  IUP at 12.2 weeks 2.  N/v of pregnancy 3.  Frequent headaches w/ h/o prior to pregnancy 4.  TSH low  1.  D/c'd home after IVF; pt w/o any emesis episodes in MAU 2.  Rev'd diet and disc'd possible ensure clears shakes qd 3.  Will leave VM at office r/e TSH and pt is to f/u w/ neurologist she has seen before r/e cont'd headaches, and if he/she doesn't feel comfortable treating her during the pregnancy, will [facility] to [facility] 4.  F/u as scheduled at [facility] or prn.  Balen Woolum H 12/23/2011, 9:45 PM

## 2011-12-23 NOTE — Discharge Instructions (Signed)
Hyperemesis Gravidarum Diet Hyperemesis gravidarum is a severe form of morning sickness. It is characterized by frequent and severe vomiting. It happens during the first trimester of pregnancy. It may be caused by the rapid hormone changes that happen during pregnancy. It is associated with a 5% weight loss of pre-pregnancy weight. The hyperemesis diet may be used to lessen symptoms of nausea and vomiting. EATING GUIDELINES  Eat 5 to 6 small meals daily instead of 3 large meals.   Avoid foods with strong smells.   Avoid drinking 30 minutes before and after meals.   Avoid fried or high-fat foods, such as butter and cream sauces.   Starchy foods are usually well-tolerated, such as cereal, toast, bread, potatoes, pasta, rice, and pretzels.   Eat crackers before you get out of bed in the morning.   Avoid spicy foods.   Ginger may help with nausea. Add  tsp ginger to hot tea or choose ginger tea.   Continue to take your prenatal vitamins as directed by your caregiver.  SAMPLE MEAL PLAN Breakfast    cup oatmeal   1 slice toast   1 tsp heart-healthy margarine   1 tsp jelly   1 scrambled egg  Midmorning Snack   1 cup low-fat yogurt  Lunch   Plain ham sandwich   Carrot or celery sticks   1 small apple   3 graham crackers  Midafternoon Snack   Cheese and crackers  Dinner  4 oz pork tenderloin   1 small baked potato   1 tsp margarine    cup broccoli    cup grapes  Evening Snack  1 cup pudding  Document Released: 07/29/2007 Document Revised: 09/20/2011 Document Reviewed: 01/26/2011 [employer] Patient Information 2012 [employer]. 

## 2011-12-23 NOTE — Progress Notes (Signed)
Pt G3 P1 at 12.2wks, unable to keep anything down, vomiting x 7 today.  Pt was admitted earlier in pregnancy for dehydration.

## 2011-12-24 LAB — TSH: TSH: 0.256 u[IU]/mL — ABNORMAL LOW (ref 0.350–4.500)

## 2011-12-24 LAB — PREALBUMIN: Prealbumin: 18.3 mg/dL (ref 17.0–34.0)

## 2011-12-29 ENCOUNTER — Telehealth: Payer: Self-pay | Admitting: Obstetrics and Gynecology

## 2012-01-22 ENCOUNTER — Telehealth: Payer: Self-pay | Admitting: Obstetrics and Gynecology

## 2012-01-22 NOTE — Telephone Encounter (Signed)
TC from patient: 16 weeks, G3P1, with sporadic sharp lower abdominal pain. Denies leaking, bleeding, dysuria, nausea, vomiting, etc. Pain has been occurring last 1-2 weeks, no worse, just persistent. Has appointment on Friday at [facility].  Recommendations: Ibuprophen 600 mg po q 6 hours x 24 hours, then prn. Push po fluids, rest. Call with any worsening of symptoms or any other issues. Follow-up at visit or prn.  Nigel Bridgeman, CNM, [place] 01/22/12 5:15pm

## 2012-01-23 ENCOUNTER — Emergency Department (HOSPITAL_COMMUNITY)
Admission: EM | Admit: 2012-01-23 | Discharge: 2012-01-24 | Disposition: A | Discharge status: Home / Self Care | Payer: Medicaid Other | Attending: Emergency Medicine | Admitting: Emergency Medicine

## 2012-01-23 ENCOUNTER — Encounter (HOSPITAL_COMMUNITY): Payer: Self-pay | Admitting: *Deleted

## 2012-01-23 DIAGNOSIS — R1110 Vomiting, unspecified: Secondary | ICD-10-CM

## 2012-01-23 DIAGNOSIS — O9989 Other specified diseases and conditions complicating pregnancy, childbirth and the puerperium: Secondary | ICD-10-CM | POA: Insufficient documentation

## 2012-01-23 DIAGNOSIS — R82998 Other abnormal findings in urine: Secondary | ICD-10-CM | POA: Insufficient documentation

## 2012-01-23 DIAGNOSIS — R8271 Bacteriuria: Secondary | ICD-10-CM

## 2012-01-23 DIAGNOSIS — R109 Unspecified abdominal pain: Secondary | ICD-10-CM | POA: Insufficient documentation

## 2012-01-23 DIAGNOSIS — R112 Nausea with vomiting, unspecified: Secondary | ICD-10-CM | POA: Insufficient documentation

## 2012-01-23 DIAGNOSIS — R197 Diarrhea, unspecified: Secondary | ICD-10-CM | POA: Insufficient documentation

## 2012-01-23 DIAGNOSIS — J45909 Unspecified asthma, uncomplicated: Secondary | ICD-10-CM | POA: Insufficient documentation

## 2012-01-23 DIAGNOSIS — E860 Dehydration: Secondary | ICD-10-CM | POA: Insufficient documentation

## 2012-01-23 LAB — URINALYSIS, ROUTINE W REFLEX MICROSCOPIC
Bilirubin Urine: NEGATIVE
Hgb urine dipstick: NEGATIVE
Ketones, ur: NEGATIVE mg/dL
Specific Gravity, Urine: 1.026 (ref 1.005–1.030)
pH: 8.5 — ABNORMAL HIGH (ref 5.0–8.0)

## 2012-01-23 LAB — URINE MICROSCOPIC-ADD ON

## 2012-01-23 MED ORDER — SODIUM CHLORIDE 0.9 % IV BOLUS (SEPSIS)
1000.0000 mL | INTRAVENOUS | Status: AC
  Administered 2012-01-24: 1000 mL via INTRAVENOUS

## 2012-01-23 MED ORDER — ONDANSETRON HCL 4 MG/2ML IJ SOLN
4.0000 mg | Freq: Once | INTRAMUSCULAR | Status: AC
  Administered 2012-01-24: 4 mg via INTRAVENOUS
  Filled 2012-01-23: qty 2

## 2012-01-23 NOTE — ED Notes (Signed)
Pt is 18 pregnant, states that she has been vomiting x 3 days.  States that it started with diarrhea.  Pt reports (L) flank pain also.

## 2012-01-23 NOTE — ED Provider Notes (Signed)
History     CSN: [id number]  Arrival date & time 01/23/12  2035   First MD Initiated Contact with Patient 01/23/12 2337      Chief Complaint  Patient presents with  . Emesis During Pregnancy    (Consider location/radiation/quality/duration/timing/severity/associated sxs/prior treatment) HPI Comments: 21 year old female with a history of mild asthma who presents as a G3 P 1 at [redacted] weeks gestation who follows with [facility]. She states over the last 3 days she has had persistent watery diarrhea and vomiting. The diarrhea has finally resolve but the vomiting has persisted throughout the day. She admits to having decreased urinary output and mild bilateral abdominal cramping which has been ongoing for several days. She has been in contact with her OB/GYN office who recommended that she use ibuprofen yesterday with minimal relief the patient has been using Tylenol 3 with increased relief.  The history is provided by the patient and a relative.    Past Medical History  Diagnosis Date  . Asthma   . Migraines   . Chlamydia   . Gonorrhea   . No pertinent past medical history     Past Surgical History  Procedure Date  . Wisdom tooth extraction     Family History  Problem Relation Age of Onset  . Anesthesia problems Neg Hx   . Hypotension Neg Hx   . Malignant hyperthermia Neg Hx   . Pseudochol deficiency Neg Hx   . Hypertension Mother   . Asthma Mother   . Heart disease Mother   . Diabetes Mother     History  Substance Use Topics  . Smoking status: Never Smoker   . Smokeless tobacco: Never Used  . Alcohol Use: No    OB History    Grav Para Term Preterm Abortions TAB SAB Ect Mult Living   3 1 1  0 1 1 0 0 0 1      Review of Systems  All other systems reviewed and are negative.    Allergies  Review of patient's allergies indicates no known allergies.  Home Medications   Current Outpatient Rx  Name Route Sig Dispense Refill  . ACETAMINOPHEN-CODEINE  #3 300-30 MG PO TABS Oral Take 1 tablet by mouth every 4 (four) hours as needed. For pain    . ALBUTEROL SULFATE HFA 108 (90 BASE) MCG/ACT IN AERS Inhalation Inhale 2 puffs into the lungs every 6 (six) hours as needed. For wheezing or shortness of breath    . ALBUTEROL SULFATE (2.5 MG/3ML) 0.083% IN NEBU Nebulization Take 2.5 mg by nebulization every 4 (four) hours as needed. For shortness of breath    . IBUPROFEN 800 MG PO TABS Oral Take 800 mg by mouth every 8 (eight) hours as needed. For pain    . NITROFURANTOIN MONOHYD MACRO 100 MG PO CAPS Oral Take 1 capsule (100 mg total) by mouth 2 (two) times daily. 10 capsule 0  . ONDANSETRON 4 MG PO TBDP Oral Take 1 tablet (4 mg total) by mouth every 8 (eight) hours as needed for nausea. 10 tablet 0    BP 109/58  Pulse 92  Temp(Src) 98.7 F (37.1 C) (Oral)  Resp 18  SpO2 100%  LMP 09/14/2011  Physical Exam  Nursing note and vitals reviewed. Constitutional: She appears well-developed and well-nourished. No distress.  HENT:  Head: Normocephalic and atraumatic.  Mouth/Throat: No oropharyngeal exudate.       MM mildly dehydrated  Eyes: Conjunctivae and EOM are normal. Pupils are equal, round,  and reactive to light. Right eye exhibits no discharge. Left eye exhibits no discharge. No scleral icterus.  Neck: Normal range of motion. Neck supple. No JVD present. No thyromegaly present.  Cardiovascular: Normal rate, regular rhythm, normal heart sounds and intact distal pulses.  Exam reveals no gallop and no friction rub.   No murmur heard. Pulmonary/Chest: Effort normal and breath sounds normal. No respiratory distress. She has no wheezes. She has no rales.  Abdominal: Soft. Bowel sounds are normal. She exhibits no distension and no mass. There is tenderness ( Mild bilateral sided tenderness, mild suprapubic tenderness, no pain at McBurney's point, no upper abdominal pain, non-peritoneal).  Musculoskeletal: Normal range of motion. She exhibits no edema  and no tenderness.  Lymphadenopathy:    She has no cervical adenopathy.  Neurological: She is alert. Coordination normal.  Skin: Skin is warm and dry. No rash noted. No erythema.  Psychiatric: She has a normal mood and affect. Her behavior is normal.    ED Course  Procedures (including critical care time)  Labs Reviewed  URINALYSIS, ROUTINE W REFLEX MICROSCOPIC - Abnormal; Notable for the following:    Color, Urine AMBER (*) BIOCHEMICALS MAY BE AFFECTED BY COLOR   APPearance CLOUDY (*)    pH 8.5 (*)    Protein, ur 30 (*)    Leukocytes, UA SMALL (*)    All other components within normal limits  URINE MICROSCOPIC-ADD ON - Abnormal; Notable for the following:    Bacteria, UA FEW (*)    All other components within normal limits  BASIC METABOLIC PANEL - Abnormal; Notable for the following:    Sodium 134 (*)    All other components within normal limits  URINE CULTURE   No results found.   1. Dehydration   2. Bacteriuria   3. Vomiting       MDM  Well appearing though she does appear mildly dehydrated, IV fluids, Zofran, check electrolytes and urinalysis, reevaluate.    Labs show no ketones in the urine, bacteriuria present, normal basic metabolic panel, IV fluids given, improvement with Zofran and morphine, patient amenable to discharge. Doubt other significant abdominal process, no significant pain in the right lower quadrant, more generalized mild pain. Patient states that she is much better. She will return should her symptoms worsen, she has accepted and agreed to this plan.  Discharge Prescriptions include:  #1 Zofran #2 Macrobid  Vida Roller, MD 01/24/12 [id number]

## 2012-01-23 NOTE — ED Notes (Signed)
Pt stated that she is [redacted] weeks pregnant. She has been having diarrhea, n/v x few days. She stated that she was having cramping in her lower abdomen and it now is cramping throughout her stomach. She is also having right lower flank pain that started today. No burning, itching, frequency, or urgency with urination. Pt is sexually active. No vaginal bleeding. Will continue to monitor.

## 2012-01-24 LAB — BASIC METABOLIC PANEL
Calcium: 9.2 mg/dL (ref 8.4–10.5)
GFR calc Af Amer: >90 mL/min (ref >90)
GFR calc non Af Amer: >90 mL/min (ref >90)
Potassium: 3.8 mEq/L (ref 3.5–5.1)
Sodium: 134 mEq/L — ABNORMAL LOW (ref 135–145)

## 2012-01-24 MED ORDER — MORPHINE SULFATE 4 MG/ML IJ SOLN: 2.0000 mg | Freq: Once | INTRAMUSCULAR | Status: DC

## 2012-01-24 MED ORDER — NITROFURANTOIN MONOHYD MACRO 100 MG PO CAPS: 100.0000 mg | ORAL_CAPSULE | Freq: Two times a day (BID) | ORAL | Status: AC

## 2012-01-24 MED ORDER — ONDANSETRON HCL 4 MG/2ML IJ SOLN: 4.0000 mg | Freq: Once | INTRAMUSCULAR | Status: DC

## 2012-01-24 MED ORDER — ONDANSETRON 4 MG PO TBDP: 4.0000 mg | ORAL_TABLET | Freq: Three times a day (TID) | ORAL | Status: AC | PRN

## 2012-01-24 NOTE — ED Notes (Signed)
EDP made aware of fetal heart tones.

## 2012-01-25 ENCOUNTER — Ambulatory Visit (INDEPENDENT_AMBULATORY_CARE_PROVIDER_SITE_OTHER): Payer: Medicaid Other | Admitting: Registered Nurse

## 2012-01-25 ENCOUNTER — Other Ambulatory Visit: Payer: Self-pay | Admitting: Obstetrics and Gynecology

## 2012-01-25 ENCOUNTER — Encounter: Payer: Self-pay | Admitting: Registered Nurse

## 2012-01-25 VITALS — BP 110/62 | Wt 232.0 lb

## 2012-01-25 DIAGNOSIS — N63 Unspecified lump in unspecified breast: Secondary | ICD-10-CM

## 2012-01-25 DIAGNOSIS — Z331 Pregnant state, incidental: Secondary | ICD-10-CM

## 2012-01-25 NOTE — Progress Notes (Signed)
Addended by: Kizzie Fantasia C on: 01/25/2012 11:01 AM   Modules accepted: SmartSet

## 2012-01-25 NOTE — Progress Notes (Signed)
Pt is having severe back pain, and swelling in both feet Pt states she feel a hard painful knot in her Rt breast Pt states she needs PNV's

## 2012-01-25 NOTE — Progress Notes (Signed)
Assessment:  1) Back pain 2) increased lower abdominal pressure 3) breast mass noted 5-6 o'clock right breast approximately 2cm x 3cm. Pain upon palpation, without red streaks, nipple discharge, or skin changes. CX: LTC  Plan: Advised maternity belt if desires re: back pain. Back pain in pregnancy pamphlet given and discuss. Refer to breast center re: right breast mass. Rev'd signs and symptoms to report. RTO 2-3 weeks for anatomy scan.

## 2012-01-28 NOTE — Telephone Encounter (Signed)
See prev note

## 2012-01-31 ENCOUNTER — Other Ambulatory Visit: Payer: No Typology Code available for payment source

## 2012-01-31 ENCOUNTER — Ambulatory Visit
Admission: RE | Admit: 2012-01-31 | Discharge: 2012-01-31 | Disposition: A | Discharge status: Home / Self Care | Payer: Medicaid Other | Source: Ambulatory Visit | Attending: Obstetrics and Gynecology | Admitting: Obstetrics and Gynecology

## 2012-01-31 DIAGNOSIS — N63 Unspecified lump in unspecified breast: Secondary | ICD-10-CM

## 2012-02-12 ENCOUNTER — Ambulatory Visit (INDEPENDENT_AMBULATORY_CARE_PROVIDER_SITE_OTHER): Payer: Medicaid Other | Admitting: Obstetrics and Gynecology

## 2012-02-12 ENCOUNTER — Ambulatory Visit (INDEPENDENT_AMBULATORY_CARE_PROVIDER_SITE_OTHER): Payer: Medicaid Other

## 2012-02-12 DIAGNOSIS — Z331 Pregnant state, incidental: Secondary | ICD-10-CM

## 2012-02-12 DIAGNOSIS — Z1389 Encounter for screening for other disorder: Secondary | ICD-10-CM

## 2012-02-12 LAB — US OB COMP + 14 WK

## 2012-02-13 ENCOUNTER — Ambulatory Visit (INDEPENDENT_AMBULATORY_CARE_PROVIDER_SITE_OTHER): Payer: Medicaid Other | Admitting: Obstetrics and Gynecology

## 2012-02-13 VITALS — BP 106/62 | Wt 232.0 lb

## 2012-02-13 DIAGNOSIS — R1115 Cyclical vomiting syndrome unrelated to migraine: Secondary | ICD-10-CM

## 2012-02-13 DIAGNOSIS — R1110 Vomiting, unspecified: Secondary | ICD-10-CM | POA: Insufficient documentation

## 2012-02-13 DIAGNOSIS — Z331 Pregnant state, incidental: Secondary | ICD-10-CM | POA: Insufficient documentation

## 2012-02-13 MED ORDER — PRENATAL VIT-DOCUSATE-IRON-FA 65-1 MG PO TABS: 1.0000 mg | ORAL_TABLET | Freq: Every day | ORAL | Status: DC

## 2012-02-13 NOTE — Progress Notes (Signed)
Pt c/o: cramps in legs. Per pt she wants to be checked for any bacteria b/c she did not finish an antibiotic Rx that was given to her from the hospital. Pt needs Rx form filled out for [facility],  Pt had U/S done yesterday that needs to be reviewed w/ her. [facility] CMA

## 2012-02-13 NOTE — Progress Notes (Signed)
[place] f/o for cardiac views, pt left without office visit and told by staff to reschedule Margaret Cline, CNM

## 2012-02-13 NOTE — Progress Notes (Signed)
S: the patient reports having leg cramps and tingling in her feet at night.  She did not complete her metronidazole and she wants [place] to repeat her wet prep for vaginosis.  She continues to have some nausea and vomiting.  She tolerates Ensure well.  She needs a refill of her prenatal vitamins.  O: extremity exam is within normal limits.  No masses or cords present.  Wet prep: Negative. Ultrasound from February 12, 2012: Single intrauterine gestation.  Normal fluid.  19-1/[redacted] weeks gestation.  Cervix:5.1 cm.  Cardiac anatomy not completely seen.  All else normal.  P: return to office in 4 weeks.  Repeat ultrasound in 6 weeks. WIC forms completed to continue ensure.  Prenatal vitamins to the pharmacy.  Dr. Stefano Gaul

## 2012-02-15 ENCOUNTER — Encounter: Payer: Medicaid Other | Admitting: Obstetrics and Gynecology

## 2012-02-19 ENCOUNTER — Telehealth: Payer: Self-pay

## 2012-02-19 NOTE — Telephone Encounter (Signed)
Triage received 

## 2012-02-22 NOTE — Telephone Encounter (Signed)
Lm on vm tcb rgd msg 

## 2012-02-26 ENCOUNTER — Telehealth: Payer: Self-pay | Admitting: Obstetrics and Gynecology

## 2012-02-26 NOTE — Telephone Encounter (Signed)
TC TO PT REGARDING MSG, LM ON VM TO CALL BACK. 

## 2012-02-26 NOTE — Telephone Encounter (Signed)
Margaret Cline/ob pt

## 2012-03-07 ENCOUNTER — Other Ambulatory Visit: Payer: Medicaid Other

## 2012-03-07 ENCOUNTER — Ambulatory Visit (INDEPENDENT_AMBULATORY_CARE_PROVIDER_SITE_OTHER): Payer: Medicaid Other | Admitting: Obstetrics and Gynecology

## 2012-03-07 VITALS — BP 110/70 | Wt 236.0 lb

## 2012-03-07 DIAGNOSIS — Z8619 Personal history of other infectious and parasitic diseases: Secondary | ICD-10-CM

## 2012-03-07 DIAGNOSIS — D649 Anemia, unspecified: Secondary | ICD-10-CM

## 2012-03-07 DIAGNOSIS — H538 Other visual disturbances: Secondary | ICD-10-CM

## 2012-03-07 DIAGNOSIS — R42 Dizziness and giddiness: Secondary | ICD-10-CM

## 2012-03-07 DIAGNOSIS — Z331 Pregnant state, incidental: Secondary | ICD-10-CM

## 2012-03-07 DIAGNOSIS — E669 Obesity, unspecified: Secondary | ICD-10-CM

## 2012-03-07 DIAGNOSIS — G43909 Migraine, unspecified, not intractable, without status migrainosus: Secondary | ICD-10-CM | POA: Insufficient documentation

## 2012-03-07 HISTORY — DX: Personal history of other infectious and parasitic diseases: Z86.19

## 2012-03-07 LAB — POCT URINALYSIS DIPSTICK
Glucose, UA: NEGATIVE
Ketones, UA: NEGATIVE
Protein, UA: NEGATIVE
Urobilinogen, UA: 1

## 2012-03-07 LAB — CBC
HCT: 31.5 % — ABNORMAL LOW (ref 36.0–46.0)
Hemoglobin: 10.6 g/dL — ABNORMAL LOW (ref 12.0–15.0)
MCH: 27.2 pg (ref 26.0–34.0)
MCHC: 33.7 g/dL (ref 30.0–36.0)
MCV: 80.8 fL (ref 78.0–100.0)

## 2012-03-07 LAB — COMPREHENSIVE METABOLIC PANEL
Albumin: 3.4 g/dL — ABNORMAL LOW (ref 3.5–5.2)
Alkaline Phosphatase: 55 U/L (ref 39–117)
BUN: 8 mg/dL (ref 6–23)
Creat: 0.58 mg/dL (ref 0.50–1.10)
Glucose, Bld: 99 mg/dL (ref 70–99)
Potassium: 4.4 mEq/L (ref 3.5–5.3)

## 2012-03-07 MED ORDER — NIFEREX-150 150-50-50 MG PO CAPS: 1.0000 | ORAL_CAPSULE | Freq: Every day | ORAL | Status: DC

## 2012-03-07 NOTE — Progress Notes (Signed)
C/o of syncope and blurred vision, also states that when sitting she still feels dizzy.

## 2012-03-07 NOTE — Patient Instructions (Signed)
Preventing Preterm Labor Preterm labor is when a pregnant woman has contractions that cause the cervix to open, shorten, and thin before 37 weeks of pregnancy. You will have regular contractions (tightening) 2 to 3 minutes apart. This usually causes discomfort or pain. HOME CARE  Eat a healthy diet.   Take your vitamins as told by your doctor.   Drink enough fluids to keep your pee (urine) clear or pale yellow every day.   Get rest and sleep.   Do not have sex if you are at high risk for preterm labor.   Follow your doctor's advice about activity, medicines, and tests.   Avoid stress.   Avoid hard labor or exercise that lasts for a long time.   Do not smoke.  GET HELP RIGHT AWAY IF:   You are having contractions.   You have belly (abdominal) pain.   You have bleeding from your vagina.   You have pain when you pee (urinate).   You have abnormal discharge from your vagina.   You have a temperature by mouth above 102 F (38.9 C).  MAKE SURE YOU:  Understand these instructions.   Will watch your condition.   Will get help if you are not doing well or get worse.  Document Released: 12/28/2008 Document Revised: 09/20/2011 Document Reviewed: 12/28/2008 Exit[employer] Patient Information 2012 [employer]. 

## 2012-03-07 NOTE — Progress Notes (Signed)
bp 104/52

## 2012-03-07 NOTE — Progress Notes (Signed)
[redacted]w[redacted]d C/o dizziness and blurry vision x1 wk, no HA States is eating regularly, tho still vomiting 3x/day  C/o ctx? and pelvic pressure, no VB, no d/c, no LOF C/o r foot swelling  A: nl pelvic, wet prep neg, cx cl/th/high - GC/CT sent Labs - pending - cbc, cmet tsh  CBG nl UA sent for cx Ext: WNL, no edema   Enc push fluids, eating sm snacks every 2hours Change positions slowly rv'd PTL sx's [place] f/u and ROB in 2wks  D/W Dr Normand Sloop  hgb low - RX Fe supplement

## 2012-03-08 LAB — GC/CHLAMYDIA PROBE AMP, GENITAL
Chlamydia, DNA Probe: NEGATIVE
GC Probe Amp, Genital: NEGATIVE

## 2012-03-09 LAB — URINE CULTURE

## 2012-03-12 ENCOUNTER — Encounter: Payer: Medicaid Other | Admitting: Obstetrics and Gynecology

## 2012-03-20 ENCOUNTER — Ambulatory Visit (INDEPENDENT_AMBULATORY_CARE_PROVIDER_SITE_OTHER): Payer: Medicaid Other

## 2012-03-20 ENCOUNTER — Encounter: Payer: Self-pay | Admitting: Obstetrics and Gynecology

## 2012-03-20 ENCOUNTER — Other Ambulatory Visit: Payer: Self-pay | Admitting: Obstetrics and Gynecology

## 2012-03-20 ENCOUNTER — Ambulatory Visit (INDEPENDENT_AMBULATORY_CARE_PROVIDER_SITE_OTHER): Payer: Medicaid Other | Admitting: Obstetrics and Gynecology

## 2012-03-20 ENCOUNTER — Telehealth: Payer: Self-pay

## 2012-03-20 VITALS — BP 110/62 | Wt 237.0 lb

## 2012-03-20 DIAGNOSIS — Z331 Pregnant state, incidental: Secondary | ICD-10-CM

## 2012-03-20 DIAGNOSIS — O4700 False labor before 37 completed weeks of gestation, unspecified trimester: Secondary | ICD-10-CM

## 2012-03-20 DIAGNOSIS — J45909 Unspecified asthma, uncomplicated: Secondary | ICD-10-CM | POA: Insufficient documentation

## 2012-03-20 DIAGNOSIS — J309 Allergic rhinitis, unspecified: Secondary | ICD-10-CM

## 2012-03-20 LAB — US OB FOLLOW UP

## 2012-03-20 LAB — POCT URINALYSIS DIPSTICK
Spec Grav, UA: 1.015
Urobilinogen, UA: NEGATIVE

## 2012-03-20 MED ORDER — FERROUS SULFATE 325 (65 FE) MG PO TABS: 325.0000 mg | ORAL_TABLET | Freq: Every day | ORAL | Status: DC

## 2012-03-20 MED ORDER — CETIRIZINE HCL 10 MG PO CAPS: 1.0000 | ORAL_CAPSULE | Freq: Every day | ORAL | Status: DC

## 2012-03-20 NOTE — Telephone Encounter (Signed)
Lm on pt's vm to cb per allergist referral. Appt sched 03/28/12@12 :30 with Dr. Willa Rough.

## 2012-03-20 NOTE — Telephone Encounter (Signed)
Tc from pt. Pt informed of referral sched with Dr. Willa Rough on 03/28/12@12 :30. Pt agrees.

## 2012-03-20 NOTE — Progress Notes (Signed)
C/o irregular contractions. Not daily, but intermittent Results for orders placed in visit on 03/20/12  POCT URINALYSIS DIPSTICK      Component Value Range   Color, UA       Clarity, UA       Glucose, UA neg     Bilirubin, UA neg     Ketones, UA small     Spec Grav, UA 1.015     Blood, UA neg     pH, UA 6.0     Protein, UA trace     Urobilinogen, UA negative     Nitrite, UA neg     Leukocytes, UA Trace    Had neg urine culture on5/24/13  Ultrasound shows:  SIUP  S=D     [place] EDD:07/07/12 EFW=1LB 8OZ 20TH PERCENTILE           AFI: nl           Cervical length:3.99 cm           Placenta localization: posterior           Fetal presentation: cephalic                   Anatomy survey is normal and completed from last visit           Gender : female  1 hr glucola NV  C/O sinus pressure.  Has untreated asthma and sinusitis without fever.   Rec:  Referral  Dr. Willa Rough

## 2012-03-28 ENCOUNTER — Telehealth: Payer: Self-pay | Admitting: Obstetrics and Gynecology

## 2012-03-28 NOTE — Telephone Encounter (Signed)
Pc to Dr.Hicks office per telephone call. Spoke with Dr.Hicks. Wants [facility] to know this pt was started on Pulmicort and also wants to know if ok to pres Singulair. Consulted with SL, ok to pres Singulair. Dr. Willa Rough agrees.

## 2012-03-28 NOTE — Telephone Encounter (Signed)
Chandra/cht received 

## 2012-03-30 ENCOUNTER — Inpatient Hospital Stay (HOSPITAL_COMMUNITY)
Admission: AD | Admit: 2012-03-30 | Discharge: 2012-03-31 | Disposition: A | Discharge status: Home / Self Care | Payer: Medicaid Other | Source: Ambulatory Visit | Attending: Obstetrics and Gynecology | Admitting: Obstetrics and Gynecology

## 2012-03-30 ENCOUNTER — Telehealth: Payer: Self-pay | Admitting: Obstetrics and Gynecology

## 2012-03-30 ENCOUNTER — Encounter (HOSPITAL_COMMUNITY): Payer: Self-pay | Admitting: *Deleted

## 2012-03-30 DIAGNOSIS — N898 Other specified noninflammatory disorders of vagina: Secondary | ICD-10-CM

## 2012-03-30 DIAGNOSIS — Z8619 Personal history of other infectious and parasitic diseases: Secondary | ICD-10-CM

## 2012-03-30 DIAGNOSIS — E669 Obesity, unspecified: Secondary | ICD-10-CM

## 2012-03-30 DIAGNOSIS — Z331 Pregnant state, incidental: Secondary | ICD-10-CM

## 2012-03-30 DIAGNOSIS — O99891 Other specified diseases and conditions complicating pregnancy: Secondary | ICD-10-CM | POA: Insufficient documentation

## 2012-03-30 LAB — WET PREP, GENITAL: Trich, Wet Prep: NONE SEEN

## 2012-03-30 NOTE — MAU Provider Note (Signed)
History   Margaret Cline is a 20y.o. Obese, SBF at [redacted]w[redacted]d who presents w/ CC of mucousy d/c x2d (reported to RN before I entered room, had been "2 weeks).  No VB, no odor, no pain; doesn't discern any ctxs or tightening.  GFM.  Last SVD 5 yrs ago.  No recent IC.  No recent illness or fever; no GI or resp c/o's.  Next appt 04/18/12 for 1hr gtt.  CSN: [id number]  Arrival date and time: 03/30/12 2225   First Provider Initiated Contact with Patient 03/30/12 2329      Chief Complaint  Patient presents with  . Vaginal Discharge   HPI  OB History    Grav Para Term Preterm Abortions TAB SAB Ect Mult Living   3 1 1  0 1 1 0 0 1 1      Past Medical History  Diagnosis Date  . Asthma   . Chlamydia 2008  . Gonorrhea   . Irregular periods/menstrual cycles   . Constipation, chronic 11/13/11  . Migraines     Migraines  . Depression     postpartum  . Trichomonas 2008  . Hx: UTI (urinary tract infection)     Frequently  . H/O varicella     Past Surgical History  Procedure Date  . Wisdom tooth extraction     Family History  Problem Relation Age of Onset  . Anesthesia problems Neg Hx   . Hypotension Neg Hx   . Malignant hyperthermia Neg Hx   . Pseudochol deficiency Neg Hx   . Hypertension Mother   . Asthma Mother   . Heart disease Mother   . Diabetes Mother   . Stroke Mother   . Depression Sister   . Hypertension Sister   . Kidney disease Maternal Aunt     Kidney failure  . Heart disease Maternal Grandfather     History  Substance Use Topics  . Smoking status: Never Smoker   . Smokeless tobacco: Never Used  . Alcohol Use: No    Allergies: No Known Allergies  Prescriptions prior to admission  Medication Sig Dispense Refill  . acetaminophen-codeine (TYLENOL #3) 300-30 MG per tablet Take 1 tablet by mouth every 4 (four) hours as needed. For pain      . albuterol (PROVENTIL HFA;VENTOLIN HFA) 108 (90 BASE) MCG/ACT inhaler Inhale 2 puffs into the lungs every 6 (six) hours as  needed. For wheezing or shortness of breath      . albuterol (PROVENTIL) (2.5 MG/3ML) 0.083% nebulizer solution Take 2.5 mg by nebulization every 4 (four) hours as needed. For shortness of breath      . Cetirizine HCl 10 MG CAPS Take 1 capsule (10 mg total) by mouth daily.  30 capsule  12  . Fe-Succ Ac-C-Thre Ac (NIFEREX-150) 150-50-50 MG CAPS Take 1 capsule by mouth daily.  30 each  0  . ferrous sulfate 325 (65 FE) MG tablet Take 1 tablet (325 mg total) by mouth daily with breakfast.  30 tablet  11  . ibuprofen (ADVIL,MOTRIN) 800 MG tablet Take 800 mg by mouth every 8 (eight) hours as needed. For pain      . Norgestimate-Eth Estradiol (SPRINTEC 28 PO) Take by mouth.      . Prenatal Vit-Docusate-Iron-FA 65-1 MG TABS Take 1 mg by mouth daily.  100 each  6    ROS--see history above Physical Exam   Blood pressure 110/60, pulse 99, temperature 98.1 F (36.7 C), temperature source Oral, resp. rate 18, height 5'  7" (1.702 m), weight 237 lb 3.2 oz (107.593 kg), last menstrual period 09/14/2011.  Physical Exam  Constitutional: She is oriented to person, place, and time. She appears well-developed and well-nourished. No distress.  HENT:  Head: Normocephalic and atraumatic.  Eyes: Pupils are equal, round, and reactive to light.       glasses  Cardiovascular: Normal rate.   Respiratory: Effort normal.  GI: Soft.       gravid  Genitourinary:       Ext os 1cm/ int os closed/long/high; around 0100 on ant portion of cx, almost appears to be a fissure of sorts; scant white d/c in vault; no pooling.   Neurological: She is alert and oriented to person, place, and time.  Skin: Skin is warm and dry.  .. Results for orders placed during the hospital encounter of 03/30/12 (from the past 24 hour(s))  WET PREP, GENITAL     Status: Abnormal   Collection Time   03/30/12 11:35 PM      Component Value Range   Yeast Wet Prep HPF POC NONE SEEN  NONE SEEN   Trich, Wet Prep NONE SEEN  NONE SEEN   Clue Cells Wet  Prep HPF POC FEW (*) NONE SEEN   WBC, Wet Prep HPF POC FEW (*) NONE SEEN    MAU Course  Procedures 1.  Wet prep 2.  Gc/ct cx 3.  U/a 4.  NST--appropriate for GA; 150 baseline; while RN out of room, and pt in recumbent position, 3-4 mild variables noted, but when changed position last 15 min on monitor, no variables, and 10x10 accels; no discernable ctxs noted or palpated  Assessment and Plan  1.  [redacted]w[redacted]d 2.  Leukorrhea 3.  No s/s of PTL or ROM 4.  Mild variables on back, resolved w/ position change and active fetal movement heard & palpated  1.  D/c home w/ PTL precautions 2.  F/u 04/18/12 or prn 3.  Gc/ct pending at time of d/c  Kenzlie Disch H 03/30/2012, 11:37 PM

## 2012-03-30 NOTE — MAU Note (Signed)
Pt G3 P1 at 26.2wks having clear, mucous discharge x a few weeks and became heavier today.  Pt having right and left side pain that has just started.

## 2012-03-30 NOTE — MAU Note (Signed)
Pt. States that she started having some clear and yellow discharge for the last 2 weeks.  Pt. Stated that she was seeing it more often when she wiped today so she came into get it assessed.

## 2012-03-30 NOTE — Telephone Encounter (Signed)
TC from patient--26 weeks, G3P1011, with heavy mucusy d/c today. Unsure about contractions.  Positive FM No hx PTL.  Recommended eval of cervix in MAU to be sure no unexpected cervical changes. Will see in MAU.

## 2012-03-31 LAB — URINALYSIS, ROUTINE W REFLEX MICROSCOPIC
Bilirubin Urine: NEGATIVE
Glucose, UA: NEGATIVE mg/dL
Hgb urine dipstick: NEGATIVE
Nitrite: NEGATIVE
Specific Gravity, Urine: >1.03 — ABNORMAL HIGH (ref 1.005–1.030)
pH: 6 (ref 5.0–8.0)

## 2012-03-31 NOTE — Discharge Instructions (Signed)
Preventing Preterm Labor Preterm labor is when a pregnant woman has contractions that cause the cervix to open, shorten, and thin before 37 weeks of pregnancy. You will have regular contractions (tightening) 2 to 3 minutes apart. This usually causes discomfort or pain. HOME CARE  Eat a healthy diet.   Take your vitamins as told by your doctor.   Drink enough fluids to keep your pee (urine) clear or pale yellow every day.   Get rest and sleep.   Do not have sex if you are at high risk for preterm labor.   Follow your doctor's advice about activity, medicines, and tests.   Avoid stress.   Avoid hard labor or exercise that lasts for a long time.   Do not smoke.  GET HELP RIGHT AWAY IF:   You are having contractions.   You have belly (abdominal) pain.   You have bleeding from your vagina.   You have pain when you pee (urinate).   You have abnormal discharge from your vagina.   You have a temperature by mouth above 102 F (38.9 C).  MAKE SURE YOU:  Understand these instructions.   Will watch your condition.   Will get help if you are not doing well or get worse.  Document Released: 12/28/2008 Document Revised: 09/20/2011 Document Reviewed: 12/28/2008 Exit[employer] Patient Information 2012 [employer]. 

## 2012-04-01 LAB — GC/CHLAMYDIA PROBE AMP, GENITAL: Chlamydia, DNA Probe: NEGATIVE

## 2012-04-13 ENCOUNTER — Inpatient Hospital Stay (HOSPITAL_COMMUNITY)
Admission: AD | Admit: 2012-04-13 | Discharge: 2012-04-13 | Disposition: A | Discharge status: Home / Self Care | Payer: Medicaid Other | Source: Ambulatory Visit | Attending: Obstetrics and Gynecology | Admitting: Obstetrics and Gynecology

## 2012-04-13 ENCOUNTER — Telehealth: Payer: Self-pay | Admitting: Obstetrics and Gynecology

## 2012-04-13 ENCOUNTER — Inpatient Hospital Stay (HOSPITAL_COMMUNITY): Payer: Medicaid Other

## 2012-04-13 ENCOUNTER — Encounter (HOSPITAL_COMMUNITY): Payer: Self-pay | Admitting: Obstetrics and Gynecology

## 2012-04-13 DIAGNOSIS — O26899 Other specified pregnancy related conditions, unspecified trimester: Secondary | ICD-10-CM | POA: Diagnosis present

## 2012-04-13 DIAGNOSIS — O99891 Other specified diseases and conditions complicating pregnancy: Secondary | ICD-10-CM | POA: Insufficient documentation

## 2012-04-13 DIAGNOSIS — E669 Obesity, unspecified: Secondary | ICD-10-CM

## 2012-04-13 DIAGNOSIS — N949 Unspecified condition associated with female genital organs and menstrual cycle: Secondary | ICD-10-CM | POA: Insufficient documentation

## 2012-04-13 DIAGNOSIS — Z8619 Personal history of other infectious and parasitic diseases: Secondary | ICD-10-CM

## 2012-04-13 DIAGNOSIS — R102 Pelvic and perineal pain: Secondary | ICD-10-CM | POA: Diagnosis present

## 2012-04-13 LAB — URINALYSIS, ROUTINE W REFLEX MICROSCOPIC
Glucose, UA: NEGATIVE mg/dL
Hgb urine dipstick: NEGATIVE
Specific Gravity, Urine: 1.01 (ref 1.005–1.030)
Urobilinogen, UA: 0.2 mg/dL (ref 0.0–1.0)
pH: 7.5 (ref 5.0–8.0)

## 2012-04-13 LAB — URINE MICROSCOPIC-ADD ON

## 2012-04-13 LAB — FETAL FIBRONECTIN: Fetal Fibronectin: NEGATIVE

## 2012-04-13 MED ORDER — CYCLOBENZAPRINE HCL 10 MG PO TABS: 10.0000 mg | ORAL_TABLET | Freq: Three times a day (TID) | ORAL | Status: AC | PRN

## 2012-04-13 NOTE — MAU Provider Note (Signed)
History   21 yo G3P1011 at 28 2/7 weeks presented c/o sporadic contractions and pelvic pressure last few days, didn't sleep last night.  Denies leaking or bleeding, reports +FM.  No IC in last 24 hours.  Patient Active Problem List  Diagnosis  . Pregnant state, incidental  . Hyperemesis  . Obesity  . Migraine  . History of chlamydia  . History of trichomoniasis  . Asthma     Chief Complaint  Patient presents with  . Back Pain  . Contractions  . Vaginal Discharge     OB History    Grav Para Term Preterm Abortions TAB SAB Ect Mult Living   3 1 1  0 1 1 0 0 0 1      Past Medical History  Diagnosis Date  . Asthma   . Chlamydia 2008  . Gonorrhea   . Irregular periods/menstrual cycles   . Constipation, chronic 11/13/11  . Migraines     Migraines  . Depression     postpartum  . Trichomonas 2008  . Hx: UTI (urinary tract infection)     Frequently  . H/O varicella     Past Surgical History  Procedure Date  . Wisdom tooth extraction     Family History  Problem Relation Age of Onset  . Anesthesia problems Neg Hx   . Hypotension Neg Hx   . Malignant hyperthermia Neg Hx   . Pseudochol deficiency Neg Hx   . Hypertension Mother   . Asthma Mother   . Heart disease Mother   . Diabetes Mother   . Stroke Mother   . Depression Sister   . Hypertension Sister   . Kidney disease Maternal Aunt     Kidney failure  . Heart disease Maternal Grandfather     History  Substance Use Topics  . Smoking status: Never Smoker   . Smokeless tobacco: Never Used  . Alcohol Use: No    Allergies: No Known Allergies  Prescriptions prior to admission  Medication Sig Dispense Refill  . albuterol (PROAIR HFA) 108 (90 BASE) MCG/ACT inhaler Inhale 2 puffs into the lungs every 4 (four) hours as needed. For shortness of breath/wheezing      . calcium carbonate (TUMS - DOSED IN MG ELEMENTAL CALCIUM) 500 MG chewable tablet Chew 2 tablets by mouth daily as needed. For heartburn      .  cetirizine (ZYRTEC) 10 MG tablet Take 10 mg by mouth daily.      Marland Kitchen ibuprofen (ADVIL,MOTRIN) 800 MG tablet Take 800 mg by mouth every 6 (six) hours as needed. For pain      . Prenatal Vit-Fe Fumarate-FA (PRENATAL MULTIVITAMIN) TABS Take 1 tablet by mouth daily.         Physical Exam   Blood pressure 116/69, pulse 104, temperature 99.1 F (37.3 C), temperature source Oral, resp. rate 18, last menstrual period 09/14/2011.  Chest clear Heart RRR without murmur Abd gravid, NT Pelvic--cervix external os 1 cm, internal os closed, long, firm, pp oop Ext WNL  FHR reassuring on initial tracing. UC none  ED Course  IUP at 28 2/7 weeks Pelvic pain  Plan: FFN  UA  Nigel Bridgeman, CNM, [place] 04/13/12 1p  Addendum: FFN negative, no contractions. FHR remains non-reactive.  Will get BPP and AFI.  Nigel Bridgeman, CNM, [place] 04/13/12 1:30p  Addendum: Just returned from [place]:  BPP 8/8, AFI 17.03, cx 3.89, vtx Will d/c home with recommendations for fluids, Flexeril 10 mg po TID prn pelvic pain. Keep  scheduled appointment 04/16/12 or call prn.  Nigel Bridgeman, CNM, [place] 04/13/12 3:50p

## 2012-04-13 NOTE — Discharge Instructions (Signed)

## 2012-04-13 NOTE — Telephone Encounter (Signed)
TC from patient--28 weeks, contractions and pelvic pressure this am. No leaking or bleeding, +FM.  Come to MAU.

## 2012-04-18 ENCOUNTER — Other Ambulatory Visit: Payer: Medicaid Other

## 2012-04-18 ENCOUNTER — Ambulatory Visit (INDEPENDENT_AMBULATORY_CARE_PROVIDER_SITE_OTHER): Payer: Medicaid Other

## 2012-04-18 VITALS — BP 100/60 | Wt 242.0 lb

## 2012-04-18 DIAGNOSIS — Z331 Pregnant state, incidental: Secondary | ICD-10-CM

## 2012-04-18 LAB — HEMOGLOBIN: Hemoglobin: 10.3 g/dL — ABNORMAL LOW (ref 12.0–15.0)

## 2012-04-18 NOTE — Progress Notes (Signed)
O pos; 1hr gtt today.  Had pancakes for breakfast. Rev'd PTL s/s.  Disc'd FH WNL.  GFM.  No other c/o's.

## 2012-04-18 NOTE — Progress Notes (Signed)
Pt concerned with the baby's growth

## 2012-04-28 ENCOUNTER — Encounter: Payer: Self-pay | Admitting: Obstetrics and Gynecology

## 2012-04-28 ENCOUNTER — Telehealth: Payer: Self-pay

## 2012-04-28 ENCOUNTER — Telehealth: Payer: Self-pay | Admitting: Obstetrics and Gynecology

## 2012-04-28 ENCOUNTER — Ambulatory Visit (INDEPENDENT_AMBULATORY_CARE_PROVIDER_SITE_OTHER): Payer: Medicaid Other | Admitting: Obstetrics and Gynecology

## 2012-04-28 VITALS — BP 100/78 | Wt 240.0 lb

## 2012-04-28 DIAGNOSIS — Z331 Pregnant state, incidental: Secondary | ICD-10-CM

## 2012-04-28 NOTE — Telephone Encounter (Signed)
Per note below, pt worked in for appt today @ 1200 w/ [facility] for evaluation.

## 2012-04-28 NOTE — Progress Notes (Signed)
Patient ID: Charmane Protzman, female   DOB: 1991-07-13, 21 y.o.   MRN: [id number] [redacted]w[redacted]d C/o of backache off and on since last visit. Denies srom or vag bleeding, +FM. Fedra Marion is here for a return obstetrical visit.   [redacted]w[redacted]d Normal hair distrubition mons pubis,  EGBUS and perineum WNL, vagina pink, moist normal rugae Cervix LTC Wet prep  GC/CHL, FFN pending Discussed if + FFN would consider BMZ, limited activity. Reviewed s/s preterm labor, srom, vag bleeding, kick counts to report, encouraged 8 water daily and frequent voids.daily fetal kick counts to report, Keep f/o office appt. [name], CNM

## 2012-04-28 NOTE — Progress Notes (Signed)
C/o low back pain entire back not just one side since Friday  C/o both leg cramps prevents sleeping since Friday no relief with Epsom salt nor soaking in warm water 1 gtt 84 Hemoglobin 10.3 RPR NR

## 2012-04-28 NOTE — Telephone Encounter (Signed)
Message copied by Delon Sacramento on Mon Apr 28, 2012  9:36 AM ------      Message from: Cornelius Moras      Created: Sun Apr 27, 2012 10:10 PM      Regarding: Needs appointment for Monday       Called me tonight with cramping in both lower legs, pain in both calves.  I instructed her to use Ibuprophen 600 mg po q 6 hours x 2 doses, and that she needed to be seen in the office on Monday.            Please call her at [phone] in the am to arrange an appointment.            Thanks!      VL

## 2012-04-28 NOTE — Telephone Encounter (Signed)
Spoke with pt informing her FFN test neg reviewed by [facility]. No further testing is needed. Pt voices understanding.

## 2012-04-29 LAB — GC/CHLAMYDIA PROBE AMP, GENITAL: GC Probe Amp, Genital: NEGATIVE

## 2012-04-30 LAB — STREP B DNA PROBE: GBSP: NEGATIVE

## 2012-05-02 ENCOUNTER — Encounter: Payer: Medicaid Other | Admitting: Obstetrics and Gynecology

## 2012-05-07 ENCOUNTER — Encounter (HOSPITAL_COMMUNITY): Payer: Self-pay | Admitting: *Deleted

## 2012-05-07 ENCOUNTER — Inpatient Hospital Stay (HOSPITAL_COMMUNITY)
Admission: AD | Admit: 2012-05-07 | Discharge: 2012-05-07 | Disposition: A | Discharge status: Home / Self Care | Payer: Medicaid Other | Source: Ambulatory Visit | Attending: Obstetrics and Gynecology | Admitting: Obstetrics and Gynecology

## 2012-05-07 DIAGNOSIS — O99891 Other specified diseases and conditions complicating pregnancy: Secondary | ICD-10-CM | POA: Insufficient documentation

## 2012-05-07 DIAGNOSIS — R109 Unspecified abdominal pain: Secondary | ICD-10-CM | POA: Insufficient documentation

## 2012-05-07 DIAGNOSIS — B9689 Other specified bacterial agents as the cause of diseases classified elsewhere: Secondary | ICD-10-CM

## 2012-05-07 DIAGNOSIS — N760 Acute vaginitis: Secondary | ICD-10-CM

## 2012-05-07 DIAGNOSIS — O4700 False labor before 37 completed weeks of gestation, unspecified trimester: Secondary | ICD-10-CM

## 2012-05-07 LAB — AMNISURE RUPTURE OF MEMBRANE (ROM) NOT AT ARMC: Amnisure ROM: NEGATIVE

## 2012-05-07 LAB — URINALYSIS, ROUTINE W REFLEX MICROSCOPIC
Bilirubin Urine: NEGATIVE
Ketones, ur: NEGATIVE mg/dL
Nitrite: NEGATIVE
Specific Gravity, Urine: >1.03 — ABNORMAL HIGH (ref 1.005–1.030)
Urobilinogen, UA: 0.2 mg/dL (ref 0.0–1.0)

## 2012-05-07 LAB — WET PREP, GENITAL: Yeast Wet Prep HPF POC: NONE SEEN

## 2012-05-07 MED ORDER — ACETAMINOPHEN 500 MG PO TABS
1000.0000 mg | ORAL_TABLET | Freq: Once | ORAL | Status: AC
  Administered 2012-05-07: 1000 mg via ORAL
  Filled 2012-05-07: qty 2

## 2012-05-07 MED ORDER — METRONIDAZOLE 500 MG PO TABS: 500.0000 mg | ORAL_TABLET | Freq: Two times a day (BID) | ORAL | Status: AC

## 2012-05-07 NOTE — MAU Note (Signed)
Pt states she lost a lot of mucous.Pt states since she has seen the mucous she has been hurting really bad

## 2012-05-07 NOTE — Progress Notes (Signed)
Pt states she has pressure in her hips,and pain all around her lower abdomen

## 2012-05-07 NOTE — MAU Note (Signed)
Pt G3 P1 at 31.5wks, passed mucous plug earlier today and spotting, felt a small amt of fluid leak out and having abd pain and pressure.

## 2012-05-08 DIAGNOSIS — N760 Acute vaginitis: Secondary | ICD-10-CM

## 2012-05-08 LAB — GC/CHLAMYDIA PROBE AMP, GENITAL
Chlamydia, DNA Probe: NEGATIVE
GC Probe Amp, Genital: NEGATIVE

## 2012-05-08 NOTE — MAU Provider Note (Signed)
History     CSN: [id number]  Arrival date and time: 05/07/12 2108   None     Chief Complaint  Patient presents with  . Abdominal Pain  . Vaginal Discharge   HPI Comments: Pt is a G3P1 at [redacted]w[redacted]d that arrives unannounced w c/o pelvic pressure ?ctx, mucous d/c, ?LOF, spotting. She reports +FM. SHe denies recent IC. Pt has had multiple visits for cramping/dc, hx BV tx'd several times. Recent workup on 7-15 GBS/GC/CT/FFN/wetpre/UA all neg/ Pt states she drinks water "all day long"   Abdominal Pain Pertinent negatives include no dysuria, nausea or vomiting.  Vaginal Discharge The patient's primary symptoms include a vaginal discharge. Associated symptoms include abdominal pain and back pain. Pertinent negatives include no dysuria, nausea or vomiting.     Past Medical History  Diagnosis Date  . Asthma   . Chlamydia 2008  . Gonorrhea   . Irregular periods/menstrual cycles   . Constipation, chronic 11/13/11  . Migraines     Migraines  . Depression     postpartum  . Trichomonas 2008  . Hx: UTI (urinary tract infection)     Frequently  . H/O varicella     Past Surgical History  Procedure Date  . Wisdom tooth extraction     Family History  Problem Relation Age of Onset  . Anesthesia problems Neg Hx   . Hypotension Neg Hx   . Malignant hyperthermia Neg Hx   . Pseudochol deficiency Neg Hx   . Hypertension Mother   . Asthma Mother   . Heart disease Mother   . Diabetes Mother   . Stroke Mother   . Depression Sister   . Hypertension Sister   . Kidney disease Maternal Aunt     Kidney failure  . Heart disease Maternal Grandfather     History  Substance Use Topics  . Smoking status: Never Smoker   . Smokeless tobacco: Never Used  . Alcohol Use: No    Allergies: No Known Allergies  No prescriptions prior to admission    Review of Systems  Gastrointestinal: Positive for abdominal pain. Negative for nausea and vomiting.  Genitourinary: Positive for vaginal  discharge. Negative for dysuria.       Mucous d/c, spotting  Musculoskeletal: Positive for back pain.       Pelvic pain   Physical Exam   Blood pressure 107/70, pulse 101, temperature 98.5 F (36.9 C), temperature source Oral, resp. rate 18, height 5\' 7"  (1.702 m), weight 240 lb 6.4 oz (109.045 kg), last menstrual period 09/14/2011.  Physical Exam  Nursing note and vitals reviewed. Constitutional: She is oriented to person, place, and time. She appears well-developed and well-nourished. No distress.  HENT:  Head: Normocephalic.  Eyes: Pupils are equal, round, and reactive to light.  Neck: Normal range of motion.  Cardiovascular: Normal rate, regular rhythm and normal heart sounds.   Respiratory: Effort normal and breath sounds normal.  GI: Soft. Bowel sounds are normal. She exhibits no distension. There is no tenderness.  Genitourinary: Vaginal discharge found.       lg amt th yellowish d/c, +whiff No blood in vault Cervix friable, CTH   Musculoskeletal: Normal range of motion. She exhibits no edema.  Neurological: She is alert and oriented to person, place, and time. She has normal reflexes.  Skin: Skin is warm and dry.  Psychiatric: She has a normal mood and affect. Her behavior is normal.   FHR 140 reactive cat 1 toco - none  MAU Course  Procedures    Assessment and Plan  IUP at [redacted]w[redacted]d R/o PTL FFN neg Wet prep +clue FHR reassuring No signs PTL UA neg, tho spec grav 1.030  D/c home rx flagyl PO Recommend daily probiotic  rv'd PO hydration, PTL sx's, FKC RTO as scheduled next week.    Jarely Juncaj M 05/08/2012, 12:32 AM

## 2012-05-13 ENCOUNTER — Telehealth: Payer: Self-pay | Admitting: Obstetrics and Gynecology

## 2012-05-13 ENCOUNTER — Ambulatory Visit (INDEPENDENT_AMBULATORY_CARE_PROVIDER_SITE_OTHER): Payer: Medicaid Other | Admitting: Obstetrics and Gynecology

## 2012-05-13 ENCOUNTER — Encounter: Payer: Self-pay | Admitting: Obstetrics and Gynecology

## 2012-05-13 VITALS — BP 110/64 | Wt 241.0 lb

## 2012-05-13 DIAGNOSIS — N760 Acute vaginitis: Secondary | ICD-10-CM

## 2012-05-13 DIAGNOSIS — O26849 Uterine size-date discrepancy, unspecified trimester: Secondary | ICD-10-CM

## 2012-05-13 DIAGNOSIS — M549 Dorsalgia, unspecified: Secondary | ICD-10-CM

## 2012-05-13 DIAGNOSIS — A499 Bacterial infection, unspecified: Secondary | ICD-10-CM

## 2012-05-13 LAB — POCT URINALYSIS DIPSTICK
Leukocytes, UA: NEGATIVE
Nitrite, UA: NEGATIVE
Protein, UA: NEGATIVE
Urobilinogen, UA: NEGATIVE

## 2012-05-13 MED ORDER — HYDROCODONE-ACETAMINOPHEN 5-500 MG PO TABS: ORAL_TABLET | ORAL | Status: DC

## 2012-05-13 MED ORDER — SALONPAS PAIN RELIEF PATCH EX PADS: 1.0000 | MEDICATED_PAD | Freq: Two times a day (BID) | CUTANEOUS | Status: DC

## 2012-05-13 NOTE — Telephone Encounter (Signed)
Tc to pt per telephone call. Pt aware rx for Vicodin on file called to pharmacy on file. Spoke with Anthony(pharm). Pt agrees

## 2012-05-13 NOTE — Progress Notes (Signed)
Pt c/o sharp pains in stomach feeling like contractions. Also c/o vomiting.  Continues to c/o back pain like back labor.  Cx long and closed.  Rec: Salon Pas Patches           Vicodin prn           If no relief pt may be willing to see physical therapist.  She will call. [place] next visit.

## 2012-05-25 ENCOUNTER — Inpatient Hospital Stay (HOSPITAL_COMMUNITY)
Admission: AD | Admit: 2012-05-25 | Discharge: 2012-05-25 | Disposition: A | Discharge status: Home / Self Care | Payer: Medicaid Other | Source: Ambulatory Visit | Attending: Obstetrics and Gynecology | Admitting: Obstetrics and Gynecology

## 2012-05-25 ENCOUNTER — Telehealth: Payer: Self-pay | Admitting: Obstetrics and Gynecology

## 2012-05-25 ENCOUNTER — Encounter (HOSPITAL_COMMUNITY): Payer: Self-pay | Admitting: *Deleted

## 2012-05-25 DIAGNOSIS — O99891 Other specified diseases and conditions complicating pregnancy: Secondary | ICD-10-CM | POA: Insufficient documentation

## 2012-05-25 DIAGNOSIS — O4700 False labor before 37 completed weeks of gestation, unspecified trimester: Secondary | ICD-10-CM

## 2012-05-25 LAB — URINALYSIS, ROUTINE W REFLEX MICROSCOPIC
Glucose, UA: NEGATIVE mg/dL
Ketones, ur: NEGATIVE mg/dL
Leukocytes, UA: NEGATIVE
pH: 6 (ref 5.0–8.0)

## 2012-05-25 LAB — WET PREP, GENITAL: Yeast Wet Prep HPF POC: NONE SEEN

## 2012-05-25 NOTE — MAU Note (Signed)
I've been leaking fld off and on since 0030. My panties are staying wet with clear fld. Smells like ammonia

## 2012-05-25 NOTE — MAU Provider Note (Signed)
History   21 yo G3P1011 at 4 2/7 weeks presented c/o leaking since 12:30am.  Reports back pain, but denies definitive contractions.  Reports FM last night, not aware of any yet this am.  Reports measuring S>D at last visit, with [place] scheduled at [facility] visit on Tuesday.  Patient Active Problem List  Diagnosis  . Pregnant state, incidental  . Hyperemesis  . Obesity  . Migraine  . History of chlamydia  . History of trichomoniasis  . Asthma  . Pelvic pain complicating pregnancy  . Bacterial vaginosis     Chief Complaint  Patient presents with  . Rupture of Membranes  . Back Pain     OB History    Grav Para Term Preterm Abortions TAB SAB Ect Mult Living   3 1 1  0 1 1 0 0 0 1      Past Medical History  Diagnosis Date  . Asthma   . Chlamydia 2008  . Gonorrhea   . Irregular periods/menstrual cycles   . Constipation, chronic 11/13/11  . Migraines     Migraines  . Depression     postpartum  . Trichomonas 2008  . Hx: UTI (urinary tract infection)     Frequently  . H/O varicella     Past Surgical History  Procedure Date  . Wisdom tooth extraction     Family History  Problem Relation Age of Onset  . Anesthesia problems Neg Hx   . Hypotension Neg Hx   . Malignant hyperthermia Neg Hx   . Pseudochol deficiency Neg Hx   . Hypertension Mother   . Asthma Mother   . Heart disease Mother   . Diabetes Mother   . Stroke Mother   . Depression Sister   . Hypertension Sister   . Kidney disease Maternal Aunt     Kidney failure  . Heart disease Maternal Grandfather     History  Substance Use Topics  . Smoking status: Never Smoker   . Smokeless tobacco: Never Used  . Alcohol Use: No    Allergies: No Known Allergies  Prescriptions prior to admission  Medication Sig Dispense Refill  . acetaminophen (TYLENOL) 500 MG tablet Take 500 mg by mouth every 6 (six) hours as needed. Legg cramps      . albuterol (PROAIR HFA) 108 (90 BASE) MCG/ACT inhaler Inhale 2 puffs into the  lungs every 4 (four) hours as needed. For shortness of breath/wheezing      . calcium carbonate (TUMS - DOSED IN MG ELEMENTAL CALCIUM) 500 MG chewable tablet Chew 2 tablets by mouth daily as needed. For heartburn      . cetirizine (ZYRTEC) 10 MG tablet Take 10 mg by mouth daily.      Marland Kitchen HYDROcodone-acetaminophen (VICODIN) 5-500 MG per tablet ONE TABLET BY MOUTH EVERY FOUR HOURS AS NEEDED FOR PAIN.  30 tablet  0  . Liniments (SALONPAS PAIN RELIEF PATCH) PADS Apply 1 each topically 2 (two) times daily.  30 each  0  . Prenatal Vit-Fe Fumarate-FA (PRENATAL MULTIVITAMIN) TABS Take 1 tablet by mouth daily.         Physical Exam   Blood pressure 111/72, pulse 108, temperature 97.7 F (36.5 C), temperature source Oral, resp. rate 22, height 5\' 7"  (1.702 m), weight 241 lb 6.4 oz (109.498 kg), last menstrual period 09/14/2011.  Chest clear Heart RRR without murmur Abd gravid, NT Pelvic--no pooling noted.  Vagina damp, with small amount mucusy d/c in vault.  Cervix ext os 1 cm, internal  os closed, long, vtx -2/-3. Ext WNL  FHR reactive, no d/c UCs none.   ED Course  IUP t 34 2/7 weeks ? Leaking   Plan: Amnisure GC, chlamydia, GBS, wet prep. UA.    Nigel Bridgeman CNM, [place] 05/25/2012 6:40 AM    Addendum:  Lab results: Results for orders placed during the hospital encounter of 05/25/12 (from the past 24 hour(s))  URINALYSIS, ROUTINE W REFLEX MICROSCOPIC     Status: Abnormal   Collection Time   05/25/12  6:40 AM      Component Value Range   Color, Urine YELLOW  YELLOW   APPearance CLEAR  CLEAR   Specific Gravity, Urine >1.030 (*) 1.005 - 1.030   pH 6.0  5.0 - 8.0   Glucose, UA NEGATIVE  NEGATIVE mg/dL   Hgb urine dipstick NEGATIVE  NEGATIVE   Bilirubin Urine NEGATIVE  NEGATIVE   Ketones, ur NEGATIVE  NEGATIVE mg/dL   Protein, ur NEGATIVE  NEGATIVE mg/dL   Urobilinogen, UA 0.2  0.0 - 1.0 mg/dL   Nitrite NEGATIVE  NEGATIVE   Leukocytes, UA NEGATIVE  NEGATIVE  AMNISURE RUPTURE OF  MEMBRANE (ROM)     Status: Normal   Collection Time   05/25/12  6:55 AM      Component Value Range   Amnisure ROM NEGATIVE    WET PREP, GENITAL     Status: Abnormal   Collection Time   05/25/12  6:55 AM      Component Value Range   Yeast Wet Prep HPF POC NONE SEEN  NONE SEEN   Trich, Wet Prep NONE SEEN  NONE SEEN   Clue Cells Wet Prep HPF POC NONE SEEN  NONE SEEN   WBC, Wet Prep HPF POC FEW (*) NONE SEEN   Amnisure negative. D/C'd home with review of s/s labor, SROM, to monitor FKC, and increase fluid intake due to concentrated urine. Keep scheduled appointment on Tuesday or call prn.  Nigel Bridgeman, CNM 05/25/12 7:45a

## 2012-05-25 NOTE — Telephone Encounter (Signed)
TC from patient--34 weeks, G3P1, with episode of leaking around midnight, then again just now.  Pajama bottoms wet.  No contractions, other than back pain.  +FM last night, not aware of any yet since just awakening.  Advised patient to come to MAU.

## 2012-05-27 ENCOUNTER — Other Ambulatory Visit: Payer: Self-pay | Admitting: Obstetrics and Gynecology

## 2012-05-27 ENCOUNTER — Ambulatory Visit (INDEPENDENT_AMBULATORY_CARE_PROVIDER_SITE_OTHER): Payer: Medicaid Other

## 2012-05-27 ENCOUNTER — Ambulatory Visit (INDEPENDENT_AMBULATORY_CARE_PROVIDER_SITE_OTHER): Payer: Medicaid Other | Admitting: Obstetrics and Gynecology

## 2012-05-27 VITALS — BP 110/60 | Wt 242.0 lb

## 2012-05-27 DIAGNOSIS — O26849 Uterine size-date discrepancy, unspecified trimester: Secondary | ICD-10-CM

## 2012-05-27 DIAGNOSIS — Z331 Pregnant state, incidental: Secondary | ICD-10-CM

## 2012-05-27 LAB — CULTURE, BETA STREP (GROUP B ONLY)

## 2012-05-27 NOTE — Progress Notes (Signed)
Pt states she went to the hospital 05/25/12 b/c of contractions.

## 2012-05-27 NOTE — Progress Notes (Signed)
[redacted]w[redacted]d Doing well. Ultrasound: Single gestation, vertex, normal fluid, growth 47th percentile, cervix 3.79 cm. Return office in 2 weeks. Dr. Stefano Gaul

## 2012-05-28 ENCOUNTER — Other Ambulatory Visit: Payer: Self-pay | Admitting: Obstetrics and Gynecology

## 2012-05-28 LAB — US OB FOLLOW UP

## 2012-05-28 NOTE — Telephone Encounter (Signed)
Triage pool.

## 2012-05-28 NOTE — Telephone Encounter (Signed)
Spoke with pt rgd msg pt wants refill on rx Vicodin advised pt did she ask for refill at appt yesterday pt states AVS told her to complete rx and call back if refill is needed pt states only had to pills left yesterday and took both pills now wants refill om rx advised pt will consult with provider and call her back pt states [facility] wrote her for original rx wants me to consult with [facility] rgd rx refill pt voice voice understanding

## 2012-05-28 NOTE — Telephone Encounter (Signed)
TRIAGE/RX REQ °

## 2012-05-28 NOTE — Telephone Encounter (Signed)
[facility] pt 

## 2012-05-29 ENCOUNTER — Other Ambulatory Visit: Payer: Self-pay | Admitting: Obstetrics and Gynecology

## 2012-05-29 NOTE — Telephone Encounter (Signed)
Pt called requesting RF of Vicodin.  Pt states continues w/ back pain, is 34 wks.  Pain make it hard for her to sleep @ night, has Flexeril but says it does not work.  Per [facility] pt needs appt to be re-evaluated if more Vicodin is needed.  Pt scheduled appt w/ ND for re-eval.

## 2012-05-30 ENCOUNTER — Ambulatory Visit (INDEPENDENT_AMBULATORY_CARE_PROVIDER_SITE_OTHER): Payer: Medicaid Other | Admitting: Obstetrics and Gynecology

## 2012-05-30 ENCOUNTER — Encounter: Payer: Self-pay | Admitting: Obstetrics and Gynecology

## 2012-05-30 VITALS — BP 100/56 | Temp 98.0°F | Wt 243.5 lb

## 2012-05-30 DIAGNOSIS — O26899 Other specified pregnancy related conditions, unspecified trimester: Secondary | ICD-10-CM

## 2012-05-30 DIAGNOSIS — M549 Dorsalgia, unspecified: Secondary | ICD-10-CM

## 2012-05-30 LAB — POCT URINALYSIS DIPSTICK
Blood, UA: NEGATIVE
Glucose, UA: NEGATIVE
Spec Grav, UA: 1.02

## 2012-05-30 MED ORDER — HYDROCODONE-ACETAMINOPHEN 5-500 MG PO TABS: 1.0000 | ORAL_TABLET | Freq: Four times a day (QID) | ORAL | Status: AC | PRN

## 2012-05-30 NOTE — Patient Instructions (Signed)
Fetal Movement Counts Patient Name: __________________________________________________ Patient Due Date: ____________________ Kick counts is highly recommended in high risk pregnancies, but it is a good idea for every pregnant woman to do. Start counting fetal movements at 28 weeks of the pregnancy. Fetal movements increase after eating a full meal or eating or drinking something sweet (the blood sugar is higher). It is also important to drink plenty of fluids (well hydrated) before doing the count. Lie on your left side because it helps with the circulation or you can sit in a comfortable chair with your arms over your belly (abdomen) with no distractions around you. DOING THE COUNT  Try to do the count the same time of day each time you do it.   Mark the day and time, then see how long it takes for you to feel 10 movements (kicks, flutters, swishes, rolls). You should have at least 10 movements within 2 hours. You will most likely feel 10 movements in much less than 2 hours. If you do not, wait an hour and count again. After a couple of days you will see a pattern.   What you are looking for is a change in the pattern or not enough counts in 2 hours. Is it taking longer in time to reach 10 movements?  SEEK MEDICAL CARE IF:  You feel less than 10 counts in 2 hours. Tried twice.   No movement in one hour.   The pattern is changing or taking longer each day to reach 10 counts in 2 hours.   You feel the baby is not moving as it usually does.  Date: ____________ Movements: ____________ Start time: ____________ Finish time: ____________  Date: ____________ Movements: ____________ Start time: ____________ Finish time: ____________ Date: ____________ Movements: ____________ Start time: ____________ Finish time: ____________ Date: ____________ Movements: ____________ Start time: ____________ Finish time: ____________ Date: ____________ Movements: ____________ Start time: ____________ Finish time:  ____________ Date: ____________ Movements: ____________ Start time: ____________ Finish time: ____________ Date: ____________ Movements: ____________ Start time: ____________ Finish time: ____________ Date: ____________ Movements: ____________ Start time: ____________ Finish time: ____________  Date: ____________ Movements: ____________ Start time: ____________ Finish time: ____________ Date: ____________ Movements: ____________ Start time: ____________ Finish time: ____________ Date: ____________ Movements: ____________ Start time: ____________ Finish time: ____________ Date: ____________ Movements: ____________ Start time: ____________ Finish time: ____________ Date: ____________ Movements: ____________ Start time: ____________ Finish time: ____________ Date: ____________ Movements: ____________ Start time: ____________ Finish time: ____________ Date: ____________ Movements: ____________ Start time: ____________ Finish time: ____________  Date: ____________ Movements: ____________ Start time: ____________ Finish time: ____________ Date: ____________ Movements: ____________ Start time: ____________ Finish time: ____________ Date: ____________ Movements: ____________ Start time: ____________ Finish time: ____________ Date: ____________ Movements: ____________ Start time: ____________ Finish time: ____________ Date: ____________ Movements: ____________ Start time: ____________ Finish time: ____________ Date: ____________ Movements: ____________ Start time: ____________ Finish time: ____________ Date: ____________ Movements: ____________ Start time: ____________ Finish time: ____________  Date: ____________ Movements: ____________ Start time: ____________ Finish time: ____________ Date: ____________ Movements: ____________ Start time: ____________ Finish time: ____________ Date: ____________ Movements: ____________ Start time: ____________ Finish time: ____________ Date: ____________ Movements:  ____________ Start time: ____________ Finish time: ____________ Date: ____________ Movements: ____________ Start time: ____________ Finish time: ____________ Date: ____________ Movements: ____________ Start time: ____________ Finish time: ____________ Date: ____________ Movements: ____________ Start time: ____________ Finish time: ____________  Date: ____________ Movements: ____________ Start time: ____________ Finish time: ____________ Date: ____________ Movements: ____________ Start time: ____________ Finish time: ____________ Date: ____________ Movements: ____________ Start time:   ____________ Finish time: ____________ Date: ____________ Movements: ____________ Start time: ____________ Finish time: ____________ Date: ____________ Movements: ____________ Start time: ____________ Finish time: ____________ Date: ____________ Movements: ____________ Start time: ____________ Finish time: ____________ Date: ____________ Movements: ____________ Start time: ____________ Finish time: ____________  Date: ____________ Movements: ____________ Start time: ____________ Finish time: ____________ Date: ____________ Movements: ____________ Start time: ____________ Finish time: ____________ Date: ____________ Movements: ____________ Start time: ____________ Finish time: ____________ Date: ____________ Movements: ____________ Start time: ____________ Finish time: ____________ Date: ____________ Movements: ____________ Start time: ____________ Finish time: ____________ Date: ____________ Movements: ____________ Start time: ____________ Finish time: ____________ Date: ____________ Movements: ____________ Start time: ____________ Finish time: ____________  Date: ____________ Movements: ____________ Start time: ____________ Finish time: ____________ Date: ____________ Movements: ____________ Start time: ____________ Finish time: ____________ Date: ____________ Movements: ____________ Start time: ____________ Finish  time: ____________ Date: ____________ Movements: ____________ Start time: ____________ Finish time: ____________ Date: ____________ Movements: ____________ Start time: ____________ Finish time: ____________ Date: ____________ Movements: ____________ Start time: ____________ Finish time: ____________ Date: ____________ Movements: ____________ Start time: ____________ Finish time: ____________  Date: ____________ Movements: ____________ Start time: ____________ Finish time: ____________ Date: ____________ Movements: ____________ Start time: ____________ Finish time: ____________ Date: ____________ Movements: ____________ Start time: ____________ Finish time: ____________ Date: ____________ Movements: ____________ Start time: ____________ Finish time: ____________ Date: ____________ Movements: ____________ Start time: ____________ Finish time: ____________ Date: ____________ Movements: ____________ Start time: ____________ Finish time: ____________ Document Released: 10/31/2006 Document Revised: 09/20/2011 Document Reviewed: 05/03/2009 [employer] Patient Information 2012 [employer]. 

## 2012-05-30 NOTE — Progress Notes (Signed)
C/o mid-lower back pain. No UTI sx's. Pt c/o having loose bowels 3x/daily. Decreased appetite. Occ nausea. No vomiting. Pt wants a RF for Vicodin.

## 2012-05-30 NOTE — Addendum Note (Signed)
Addended by: Rolla Plate on: 05/30/2012 02:43 PM   Modules accepted: Orders

## 2012-05-30 NOTE — Progress Notes (Signed)
Pt wants a refill on the vicodin.  She states it helps with the pain.   GBS done today RT in 2 weeks vicodin refilled last rx three weeks ago.  Pt does not have transportation for PT Cervical cultures @[place] 

## 2012-06-02 LAB — CULTURE, BETA STREP (GROUP B ONLY)

## 2012-06-02 NOTE — Telephone Encounter (Signed)
Refills for narcotic medication will need to be given during OB visits.

## 2012-06-03 NOTE — Telephone Encounter (Signed)
Tc to pt per telephone call. Pt states,"already received rx for narcotic last week". Pt agrees.

## 2012-06-08 ENCOUNTER — Telehealth: Payer: Self-pay | Admitting: Obstetrics and Gynecology

## 2012-06-08 NOTE — Telephone Encounter (Signed)
TC from patient--36 weeks, G2P1, "bad back pain" 2-3/hour. Took Vicodin last night, didn't help. Unsure if contractions.  No leaking or bleeding, +FM. Offered eval in MAU or continued observation. Patient will come to MAU.

## 2012-06-10 ENCOUNTER — Ambulatory Visit (INDEPENDENT_AMBULATORY_CARE_PROVIDER_SITE_OTHER): Payer: Medicaid Other | Admitting: Obstetrics and Gynecology

## 2012-06-10 ENCOUNTER — Encounter: Payer: Self-pay | Admitting: Obstetrics and Gynecology

## 2012-06-10 VITALS — BP 104/58 | Wt 241.0 lb

## 2012-06-10 DIAGNOSIS — Z331 Pregnant state, incidental: Secondary | ICD-10-CM

## 2012-06-10 NOTE — Progress Notes (Signed)
[redacted]w[redacted]d Gonorrhea and Chlamydia sent Beta strep negative Return office in one week Dr. Stefano Gaul

## 2012-06-10 NOTE — Progress Notes (Signed)
Pt c/o numbness and tingling in her hands. Pt also states that she feels like her vaginal area is swollen.

## 2012-06-11 LAB — GC/CHLAMYDIA PROBE AMP, GENITAL: Chlamydia, DNA Probe: NEGATIVE

## 2012-06-16 ENCOUNTER — Encounter (HOSPITAL_COMMUNITY): Payer: Self-pay | Admitting: *Deleted

## 2012-06-16 ENCOUNTER — Inpatient Hospital Stay (HOSPITAL_COMMUNITY): Payer: Medicaid Other

## 2012-06-16 ENCOUNTER — Inpatient Hospital Stay (HOSPITAL_COMMUNITY)
Admission: AD | Admit: 2012-06-16 | Discharge: 2012-06-16 | Disposition: A | Discharge status: Home / Self Care | Payer: Medicaid Other | Source: Ambulatory Visit | Attending: Obstetrics and Gynecology | Admitting: Obstetrics and Gynecology

## 2012-06-16 DIAGNOSIS — M259 Joint disorder, unspecified: Secondary | ICD-10-CM

## 2012-06-16 DIAGNOSIS — O4700 False labor before 37 completed weeks of gestation, unspecified trimester: Secondary | ICD-10-CM | POA: Insufficient documentation

## 2012-06-16 DIAGNOSIS — E669 Obesity, unspecified: Secondary | ICD-10-CM

## 2012-06-16 DIAGNOSIS — W19XXXA Unspecified fall, initial encounter: Secondary | ICD-10-CM

## 2012-06-16 DIAGNOSIS — B9689 Other specified bacterial agents as the cause of diseases classified elsewhere: Secondary | ICD-10-CM

## 2012-06-16 DIAGNOSIS — Z8619 Personal history of other infectious and parasitic diseases: Secondary | ICD-10-CM

## 2012-06-16 DIAGNOSIS — O9989 Other specified diseases and conditions complicating pregnancy, childbirth and the puerperium: Secondary | ICD-10-CM

## 2012-06-16 DIAGNOSIS — O26859 Spotting complicating pregnancy, unspecified trimester: Secondary | ICD-10-CM | POA: Insufficient documentation

## 2012-06-16 DIAGNOSIS — O26899 Other specified pregnancy related conditions, unspecified trimester: Secondary | ICD-10-CM

## 2012-06-16 DIAGNOSIS — O368190 Decreased fetal movements, unspecified trimester, not applicable or unspecified: Secondary | ICD-10-CM | POA: Insufficient documentation

## 2012-06-16 DIAGNOSIS — W010XXA Fall on same level from slipping, tripping and stumbling without subsequent striking against object, initial encounter: Secondary | ICD-10-CM | POA: Insufficient documentation

## 2012-06-16 DIAGNOSIS — M899 Disorder of bone, unspecified: Secondary | ICD-10-CM

## 2012-06-16 MED ORDER — ACETAMINOPHEN 325 MG PO TABS
650.0000 mg | ORAL_TABLET | Freq: Once | ORAL | Status: AC
  Administered 2012-06-16: 650 mg via ORAL
  Filled 2012-06-16: qty 2

## 2012-06-16 MED ORDER — ACETAMINOPHEN-CODEINE #3 300-30 MG PO TABS: 1.0000 | ORAL_TABLET | ORAL | Status: AC | PRN

## 2012-06-16 MED ORDER — ACETAMINOPHEN-CODEINE #3 300-30 MG PO TABS
2.0000 | ORAL_TABLET | Freq: Once | ORAL | Status: AC
  Administered 2012-06-16: 2 via ORAL
  Filled 2012-06-16: qty 2

## 2012-06-16 NOTE — Progress Notes (Addendum)
History  Margaret Cline is a 21 y.o. G3P1011 at [redacted]w[redacted]d   Subjective: at 1200 slipped on wet floor while doing dishes, legs went right out and fell on back and slipped to side, with brown to red mucus, no srom, with +FM, with contractions, lower backache.    Chief Complaint  Patient presents with  . Fall   @SFHPI @  Vitals:  Blood pressure 103/66, pulse 103, temperature 98.3 F (36.8 C), temperature source Oral, resp. rate 18, height 5\' 7"  (1.702 m), weight 241 lb (109.317 kg), last menstrual period 09/14/2011, SpO2 100.00%. OB History    Grav Para Term Preterm Abortions TAB SAB Ect Mult Living   3 1 1  0 1 1 0 0 0 1      Past Medical History  Diagnosis Date  . Asthma   . Chlamydia 2008  . Gonorrhea   . Irregular periods/menstrual cycles   . Constipation, chronic 11/13/11  . Migraines     Migraines  . Depression     postpartum  . Trichomonas 2008  . Hx: UTI (urinary tract infection)     Frequently  . H/O varicella     Past Surgical History  Procedure Date  . Wisdom tooth extraction     Family History  Problem Relation Age of Onset  . Anesthesia problems Neg Hx   . Hypotension Neg Hx   . Malignant hyperthermia Neg Hx   . Pseudochol deficiency Neg Hx   . Hypertension Mother   . Asthma Mother   . Heart disease Mother   . Diabetes Mother   . Stroke Mother   . Depression Sister   . Hypertension Sister   . Kidney disease Maternal Aunt     Kidney failure  . Heart disease Maternal Grandfather     History  Substance Use Topics  . Smoking status: Never Smoker   . Smokeless tobacco: Never Used  . Alcohol Use: No    Allergies: No Known Allergies  Prescriptions prior to admission  Medication Sig Dispense Refill  . albuterol (PROAIR HFA) 108 (90 BASE) MCG/ACT inhaler Inhale 2 puffs into the lungs every 4 (four) hours as needed. For shortness of breath/wheezing      . calcium carbonate (TUMS - DOSED IN MG ELEMENTAL CALCIUM) 500 MG chewable tablet Chew 2 tablets by  mouth daily as needed. For heartburn      . cetirizine (ZYRTEC) 10 MG tablet Take 10 mg by mouth daily.      Marland Kitchen HYDROcodone-acetaminophen (VICODIN) 5-500 MG per tablet ONE TABLET BY MOUTH EVERY FOUR HOURS AS NEEDED FOR PAIN.  30 tablet  0  . Prenatal Vit-Fe Fumarate-FA (PRENATAL MULTIVITAMIN) TABS Take 1 tablet by mouth daily.        @ROS @ Physical Exam   Blood pressure 103/66, pulse 103, temperature 98.3 F (36.8 C), temperature source Oral, resp. rate 18, height 5\' 7"  (1.702 m), weight 241 lb (109.317 kg), last menstrual period 09/14/2011, SpO2 100.00%.  @PHYSEXAMBYAGE2 @ Labs:  No results found for this or any previous visit (from the past 24 hour(s)). Medication List  As of 06/16/2012  4:53 PM   ASK your doctor about these medications         calcium carbonate 500 MG chewable tablet   Commonly known as: TUMS - dosed in mg elemental calcium   Chew 2 tablets by mouth daily as needed. For heartburn      cetirizine 10 MG tablet   Commonly known as: ZYRTEC   Take 10 mg by  mouth daily.      HYDROcodone-acetaminophen 5-500 MG per tablet   Commonly known as: VICODIN   ONE TABLET BY MOUTH EVERY FOUR HOURS AS NEEDED FOR PAIN.      prenatal multivitamin Tabs   Take 1 tablet by mouth daily.      PROAIR HFA 108 (90 BASE) MCG/ACT inhaler   Generic drug: albuterol   Inhale 2 puffs into the lungs every 4 (four) hours as needed. For shortness of breath/wheezing          ASSESSMENT: Patient Active Problem List  Diagnosis  . Pregnant state, incidental  . Hyperemesis  . Obesity  . Migraine  . History of chlamydia  . History of trichomoniasis  . Asthma  . Pelvic pain complicating pregnancy  . Bacterial vaginosis   Physical Examination:  General appearance - alert, well appearing, and in no distress Physical exam: Calm, no distress, abd soft, gravid, nt,  FHTS cateory 1 UC few SSE white discharge no blood Vag 1+ thick high intact ED Course  Assessment/Plan [redacted]w[redacted]d Post fall 4  hours observation, tylenol, [place], clear liquid [name], CNM   S:  Pt feels fetus is active.  No VB or LOF.  Rare ctx.  Pelvic pressure, but unchanged.  Rt side/hip hurting more as time has passed since incident.   O:  .Marland Kitchen Filed Vitals:   06/16/12 1542  BP: 103/66  Pulse: 103  Temp: 98.3 F (36.8 C)  TempSrc: Oral  Resp: 18  Height: 5\' 7"  (1.702 m)  Weight: 241 lb (109.317 kg)  SpO2: 100%   EFM:  140, reactive, moderate variability, no decels TOCO:  Rare ctx  U/s: Vtx, post placenta; afi=56%; EFW=6+12 (59%); no BPP done.   A:  1. [redacted]w[redacted]d       2. S/p fall      3.  Normal u/s and reactive and reassuring fht      4.  Rt Hip/side pain s/p fall; no s/s of labor P:  Pt requests to leave after 3 hrs of ext monitoring b/c she has to pick her older child up from her mother      D/c home w/ [facility] and labor precautions      Tylenol #3 2 tabs given po before d/c and Rx given to pick up prn     Keep appt Friday 06/20/12, or f/u prn any questions or concerns  C. Denny Levy, [place] 06/16/12, 2025

## 2012-06-16 NOTE — MAU Note (Signed)
Patient states she slipped at fell at about 1240. Fell on her back then tried to get up and fell on her right side. States she has started having contractions, some spotting and reports less fetal movement.

## 2012-06-16 NOTE — MAU Note (Signed)
Pt states she has a small sink and water gets splashed a lot. Feel came out from under her, hit her bottom, then tried to get back up and fell again landing on her right side. Rates pain 7/10 on r lower side and into back.

## 2012-06-20 ENCOUNTER — Ambulatory Visit (INDEPENDENT_AMBULATORY_CARE_PROVIDER_SITE_OTHER): Payer: Medicaid Other | Admitting: Obstetrics and Gynecology

## 2012-06-20 VITALS — BP 114/72 | Wt 242.0 lb

## 2012-06-20 DIAGNOSIS — Z331 Pregnant state, incidental: Secondary | ICD-10-CM

## 2012-06-20 NOTE — Progress Notes (Signed)
Pt states had some spotting after leaving urine sample

## 2012-06-20 NOTE — Progress Notes (Signed)
[redacted]w[redacted]d  GFM GC/Chlamydia negative No regular contractions. Mild spotting just now.

## 2012-06-24 ENCOUNTER — Ambulatory Visit (INDEPENDENT_AMBULATORY_CARE_PROVIDER_SITE_OTHER): Payer: Medicaid Other | Admitting: Obstetrics and Gynecology

## 2012-06-24 ENCOUNTER — Encounter: Payer: Self-pay | Admitting: Obstetrics and Gynecology

## 2012-06-24 VITALS — BP 118/78 | Wt 248.0 lb

## 2012-06-24 DIAGNOSIS — Z3490 Encounter for supervision of normal pregnancy, unspecified, unspecified trimester: Secondary | ICD-10-CM

## 2012-06-24 DIAGNOSIS — Z331 Pregnant state, incidental: Secondary | ICD-10-CM

## 2012-06-24 NOTE — Progress Notes (Signed)
cx check no concerns per pt

## 2012-06-24 NOTE — Progress Notes (Signed)
Doing well--some diarrhea today, no N/V. Cervix 1-2, 70%, vtx, -1. Labor s/s reviewed.

## 2012-06-28 ENCOUNTER — Telehealth: Payer: Self-pay | Admitting: Obstetrics and Gynecology

## 2012-06-28 NOTE — Telephone Encounter (Signed)
TC from pt at 39wks c/o ?discharge, leaking. States it is yellowish and not a lg amt, not saturating a pad or clothes, denies any ctx, VB. I recommended pt lay down for an hour and stand up and check pad, I also recommend she drink plenty of water, pt then was very defensive and states "I been drinking water all day" "when my water broke w my son and didn't gush out" advised pt she could try the pad test or come to MAU for eval, she then asked to talk to someone else and asked if there was someone else on call, she stated "I'm trying to ask you questions and you're being rude" I apologized and was in the process of explaining that there was a back-up provider on call and pt hung up, unable to inquire about FM.

## 2012-06-30 ENCOUNTER — Inpatient Hospital Stay (HOSPITAL_COMMUNITY)
Admission: AD | Admit: 2012-06-30 | Discharge: 2012-06-30 | Disposition: A | Discharge status: Home / Self Care | Payer: Medicaid Other | Source: Ambulatory Visit | Attending: Obstetrics and Gynecology | Admitting: Obstetrics and Gynecology

## 2012-06-30 ENCOUNTER — Encounter (HOSPITAL_COMMUNITY): Payer: Self-pay | Admitting: *Deleted

## 2012-06-30 DIAGNOSIS — M545 Low back pain, unspecified: Secondary | ICD-10-CM | POA: Insufficient documentation

## 2012-06-30 DIAGNOSIS — O471 False labor at or after 37 completed weeks of gestation: Secondary | ICD-10-CM

## 2012-06-30 DIAGNOSIS — O479 False labor, unspecified: Secondary | ICD-10-CM | POA: Insufficient documentation

## 2012-06-30 LAB — URINALYSIS, ROUTINE W REFLEX MICROSCOPIC
Leukocytes, UA: NEGATIVE
Nitrite: NEGATIVE
Protein, ur: NEGATIVE mg/dL
Specific Gravity, Urine: >1.03 — ABNORMAL HIGH (ref 1.005–1.030)
Urobilinogen, UA: 0.2 mg/dL (ref 0.0–1.0)

## 2012-06-30 LAB — AMNISURE RUPTURE OF MEMBRANE (ROM) NOT AT ARMC: Amnisure ROM: NEGATIVE

## 2012-06-30 MED ORDER — NALBUPHINE HCL 10 MG/ML IJ SOLN
10.0000 mg | Freq: Once | INTRAMUSCULAR | Status: AC
  Administered 2012-06-30: 10 mg via SUBCUTANEOUS
  Filled 2012-06-30: qty 1

## 2012-06-30 NOTE — MAU Note (Signed)
Pt G3 P1 at 39.3wks having lower back pain and contractions.  Denies bleeding or discharge.  No problems with pregnancy.

## 2012-07-01 ENCOUNTER — Encounter (HOSPITAL_COMMUNITY): Payer: Self-pay

## 2012-07-01 ENCOUNTER — Inpatient Hospital Stay (HOSPITAL_COMMUNITY)
Admission: AD | Admit: 2012-07-01 | Discharge: 2012-07-01 | Disposition: A | Discharge status: Home / Self Care | Payer: Medicaid Other | Source: Ambulatory Visit | Attending: Obstetrics and Gynecology | Admitting: Obstetrics and Gynecology

## 2012-07-01 DIAGNOSIS — M549 Dorsalgia, unspecified: Secondary | ICD-10-CM

## 2012-07-01 DIAGNOSIS — N760 Acute vaginitis: Secondary | ICD-10-CM | POA: Insufficient documentation

## 2012-07-01 DIAGNOSIS — O2390 Unspecified genitourinary tract infection in pregnancy, unspecified trimester: Secondary | ICD-10-CM | POA: Insufficient documentation

## 2012-07-01 DIAGNOSIS — O26899 Other specified pregnancy related conditions, unspecified trimester: Secondary | ICD-10-CM

## 2012-07-01 DIAGNOSIS — E669 Obesity, unspecified: Secondary | ICD-10-CM

## 2012-07-01 DIAGNOSIS — B9689 Other specified bacterial agents as the cause of diseases classified elsewhere: Secondary | ICD-10-CM | POA: Insufficient documentation

## 2012-07-01 DIAGNOSIS — M899 Disorder of bone, unspecified: Secondary | ICD-10-CM

## 2012-07-01 DIAGNOSIS — O9989 Other specified diseases and conditions complicating pregnancy, childbirth and the puerperium: Secondary | ICD-10-CM

## 2012-07-01 DIAGNOSIS — M259 Joint disorder, unspecified: Secondary | ICD-10-CM

## 2012-07-01 DIAGNOSIS — Z8619 Personal history of other infectious and parasitic diseases: Secondary | ICD-10-CM

## 2012-07-01 DIAGNOSIS — A499 Bacterial infection, unspecified: Secondary | ICD-10-CM | POA: Insufficient documentation

## 2012-07-01 DIAGNOSIS — O479 False labor, unspecified: Secondary | ICD-10-CM | POA: Insufficient documentation

## 2012-07-01 MED ORDER — HYDROXYZINE PAMOATE 25 MG PO CAPS: 25.0000 mg | ORAL_CAPSULE | ORAL | Status: DC | PRN

## 2012-07-01 MED ORDER — NALBUPHINE HCL 10 MG/ML IJ SOLN
10.0000 mg | Freq: Once | INTRAMUSCULAR | Status: AC
  Administered 2012-07-01: 10 mg via SUBCUTANEOUS
  Filled 2012-07-01: qty 1

## 2012-07-01 NOTE — MAU Note (Signed)
Pt reports she was seen last pm and was able to rest a little but contractions and pressure is worsening now.

## 2012-07-01 NOTE — Progress Notes (Signed)
History  Margaret Cline is a 21 y.o. G3P1011 at [redacted]w[redacted]d   Subjective: c/o of ongoing back pain can not walk and care for my child, dull ache lower back gets sharper at times, drinking lots of water, no srom or vag bleeding, with +FM. Last took tylenol 3 Monday it does not work, not using flexeril it does not work, took zofran does not work. No relief with heat or ice, will not go to chiropracter or physical therapy.  hx of back pain complaints at 30 weeks Hx of fall at 37 weeks  Chief Complaint  Patient presents with  . Labor Eval   @SFHPI @  Vitals:  Blood pressure 110/61, pulse 92, temperature 97.6 F (36.4 C), temperature source Oral, resp. rate 20, height 5\' 7"  (1.702 m), weight 245 lb (111.131 kg), last menstrual period 09/14/2011, SpO2 100.00%. OB History    Grav Para Term Preterm Abortions TAB SAB Ect Mult Living   3 1 1  0 1 1 0 0 0 1      Past Medical History  Diagnosis Date  . Asthma   . Chlamydia 2008  . Gonorrhea   . Irregular periods/menstrual cycles   . Constipation, chronic 11/13/11  . Migraines     Migraines  . Depression     postpartum  . Trichomonas 2008  . Hx: UTI (urinary tract infection)     Frequently  . H/O varicella     Past Surgical History  Procedure Date  . Wisdom tooth extraction     Family History  Problem Relation Age of Onset  . Anesthesia problems Neg Hx   . Hypotension Neg Hx   . Malignant hyperthermia Neg Hx   . Pseudochol deficiency Neg Hx   . Hypertension Mother   . Asthma Mother   . Heart disease Mother   . Diabetes Mother   . Stroke Mother   . Depression Sister   . Hypertension Sister   . Kidney disease Maternal Aunt     Kidney failure  . Heart disease Maternal Grandfather     History  Substance Use Topics  . Smoking status: Never Smoker   . Smokeless tobacco: Never Used  . Alcohol Use: No    Allergies: No Known Allergies  Prescriptions prior to admission  Medication Sig Dispense Refill  . acetaminophen-codeine  (TYLENOL #3) 300-30 MG per tablet Take 1-2 tablets by mouth every 4 (four) hours as needed. For pain      . albuterol (PROAIR HFA) 108 (90 BASE) MCG/ACT inhaler Inhale 2 puffs into the lungs every 4 (four) hours as needed. For shortness of breath/wheezing      . calcium carbonate (TUMS - DOSED IN MG ELEMENTAL CALCIUM) 500 MG chewable tablet Chew 2 tablets by mouth daily as needed. For heartburn      . cetirizine (ZYRTEC) 10 MG tablet Take 10 mg by mouth daily.      . Prenatal Vit-Fe Fumarate-FA (PRENATAL MULTIVITAMIN) TABS Take 1 tablet by mouth daily.        @ROS @ Physical Exam   Blood pressure 110/61, pulse 92, temperature 97.6 F (36.4 C), temperature source Oral, resp. rate 20, height 5\' 7"  (1.702 m), weight 245 lb (111.131 kg), last menstrual period 09/14/2011, SpO2 100.00%.  @PHYSEXAMBYAGE2 @ Labs:    Medications:  Scheduled    I have reviewed the patient's current medications.  ASSESSMENT: Patient Active Problem List  Diagnosis  . Pregnant state, incidental  . Hyperemesis  . Obesity  . Migraine  . History of  chlamydia  . History of trichomoniasis  . Asthma  . Pelvic pain complicating pregnancy  . Bacterial vaginosis   Physical Examination:  Physical exam: Calm, no distress, bilaterally, AP RRR, abd soft, gravid, nt, Normal hair distrubition mons pubis,  Vag 1-2 70 -3 VTX per leopolds intact No edema to lower extremities  FHTS category 1 UC few irregular ED Course  Assessment/Plan [redacted]w[redacted]d Not in active labor Back pain2 Ua pending, reposition for comfort. Margaret Cline, CNM 2115 discussed with Dr. [name] and plan for discharge. Margaret Cline, CNM  States sleepy ready to go home still with backache again reviewed use of tylenol, RX vistaril discussed, heat or ice, back exercises. Margaret Cline, CNM

## 2012-07-01 NOTE — Progress Notes (Signed)
RX vistaril discussed. Discharge home. [name], CNM

## 2012-07-02 ENCOUNTER — Ambulatory Visit (INDEPENDENT_AMBULATORY_CARE_PROVIDER_SITE_OTHER): Payer: Medicaid Other | Admitting: Obstetrics and Gynecology

## 2012-07-02 ENCOUNTER — Telehealth: Payer: Self-pay | Admitting: Obstetrics and Gynecology

## 2012-07-02 ENCOUNTER — Encounter: Payer: Self-pay | Admitting: Obstetrics and Gynecology

## 2012-07-02 VITALS — BP 116/74 | Wt 244.0 lb

## 2012-07-02 DIAGNOSIS — Z331 Pregnant state, incidental: Secondary | ICD-10-CM

## 2012-07-02 NOTE — Patient Instructions (Signed)
Labor Induction  Most women go into labor on their own between 37 and 42 weeks of the pregnancy. When this does not happen or when there is a medical need, medicine or other methods may be used to induce labor. Labor induction causes a pregnant woman's uterus to contract. It also causes the cervix to soften (ripen), open (dilate), and thin out (efface). Usually, labor is not induced before 39 weeks of the pregnancy unless there is a problem with the baby or mother. Whether your labor will be induced depends on a number of factors, including the following:  The medical condition of you and the baby.   How many weeks along you are.   The status of baby's lung maturity.   The condition of the cervix.   The position of the baby.  REASONS FOR LABOR INDUCTION  The health of the baby or mother is at risk.   The pregnancy is overdue by 1 week or more.   The water breaks but labor does not start on its own.   The mother has a health condition or serious illness such as high blood pressure, infection, placental abruption, or diabetes.   The amniotic fluid amounts are low around the baby.   The baby is distressed.  REASONS TO NOT INDUCE LABOR Labor induction may not be a good idea if:  It is shown that your baby does not tolerate labor.   An induction is just more convenient.   You want the baby to be born on a certain date, like a holiday.   You have had previous surgeries on your uterus, such as a myomectomy or the removal of fibroids.   Your placenta lies very low in the uterus and blocks the opening of the cervix (placenta previa).   Your baby is not in a head down position.   The umbilical cord drops down into the birth canal in front of the baby. This could cut off the baby's blood and oxygen supply.   You have had a previous cesarean delivery.   There areunusual circumstances, such as the baby being extremely premature.  RISKS AND COMPLICATIONS Problems may occur in the  process of induction and plans may need to be modified as a situation unfolds. Some of the risks of induction include:  Change in fetal heart rate, such as too high, too low, or erratic.   Risk of fetal distress.   Risk of infection to mother and baby.   Increased chance of having a cesarean delivery.   The rare, but increased chance that the placenta will separate from the uterus (abruption).   Uterine rupture (very rare).  When induction is needed for medical reasons, the benefits of induction may outweigh the risks. BEFORE THE PROCEDURE Your caregiver will check your cervix and the baby's position. This will help your caregiver decide if you are far enough along for an induction to work. PROCEDURE Several methods of labor induction may be used, such as:   Taking prostaglandin medicine to dilate and ripen the cervix. The medicine will also start contractions. It can be taken by mouth or by inserting a suppository into the vagina.   A thin tube (catheter) with a balloon on the end may be inserted into your vagina to dilate the cervix. Once inserted, the balloon expands with water, which causes the cervix to open.   Striping the membranes. Your caregiver inserts a finger between the cervix and membranes, which causes the cervix to be stretched and   may cause the uterus to contract. This is often done during an office visit. You will be sent home to wait for the contractions to begin. You will then come in for an induction.   Breaking the water. Your caregiver will make a hole in the amniotic sac using a small instrument. Once the amniotic sac breaks, contractions should begin. This may still take hours to see an effect.   Taking medicine to trigger or strengthen contractions. This medicine is given intravenously through a tube in your arm.  All of the methods of induction, besides stripping the membranes, will be done in the hospital. Induction is done in the hospital so that you and the  baby can be carefully monitored. AFTER THE PROCEDURE Some inductions can take up to 2 or 3 days. Depending on the cervix, it usually takes less time. It takes longer when you are induced early in the pregnancy or if this is your first pregnancy. If a mother is still pregnant and the induction has been going on for 2 to 3 days, either the mother will be sent home or a cesarean delivery will be needed. Document Released: 02/20/2007 Document Revised: 09/20/2011 Document Reviewed: 08/06/2011 [employer] Patient Information 2012 [employer]. 

## 2012-07-02 NOTE — Progress Notes (Signed)
A/P GBS negative Fetal kick counts reviewed Labor reviewed with pt All patients  questions answered 

## 2012-07-02 NOTE — Telephone Encounter (Signed)
Induction scheduled for 07/11/12 @ 6:30am with VL/VH. -Adrianne Pridgen

## 2012-07-03 ENCOUNTER — Telehealth (HOSPITAL_COMMUNITY): Payer: Self-pay | Admitting: *Deleted

## 2012-07-03 ENCOUNTER — Encounter (HOSPITAL_COMMUNITY): Payer: Self-pay | Admitting: *Deleted

## 2012-07-03 NOTE — Telephone Encounter (Signed)
Preadmission screen  

## 2012-07-06 ENCOUNTER — Inpatient Hospital Stay (HOSPITAL_COMMUNITY)
Admission: AD | Admit: 2012-07-06 | Discharge: 2012-07-07 | Disposition: A | Discharge status: Home / Self Care | Payer: Medicaid Other | Source: Ambulatory Visit | Attending: Obstetrics and Gynecology | Admitting: Obstetrics and Gynecology

## 2012-07-06 ENCOUNTER — Encounter (HOSPITAL_COMMUNITY): Payer: Self-pay

## 2012-07-06 DIAGNOSIS — O479 False labor, unspecified: Secondary | ICD-10-CM | POA: Insufficient documentation

## 2012-07-06 DIAGNOSIS — R109 Unspecified abdominal pain: Secondary | ICD-10-CM | POA: Insufficient documentation

## 2012-07-06 DIAGNOSIS — Z8619 Personal history of other infectious and parasitic diseases: Secondary | ICD-10-CM

## 2012-07-06 DIAGNOSIS — N760 Acute vaginitis: Secondary | ICD-10-CM

## 2012-07-06 DIAGNOSIS — O26899 Other specified pregnancy related conditions, unspecified trimester: Secondary | ICD-10-CM

## 2012-07-06 DIAGNOSIS — E669 Obesity, unspecified: Secondary | ICD-10-CM

## 2012-07-06 NOTE — MAU Note (Signed)
Pt reports contractions and pressure.  

## 2012-07-07 ENCOUNTER — Encounter: Payer: Medicaid Other | Admitting: Obstetrics and Gynecology

## 2012-07-07 ENCOUNTER — Telehealth: Payer: Self-pay | Admitting: Obstetrics and Gynecology

## 2012-07-07 LAB — URINALYSIS, ROUTINE W REFLEX MICROSCOPIC
Ketones, ur: NEGATIVE mg/dL
Leukocytes, UA: NEGATIVE
Nitrite: NEGATIVE
Specific Gravity, Urine: >1.03 — ABNORMAL HIGH (ref 1.005–1.030)
pH: 6.5 (ref 5.0–8.0)

## 2012-07-07 MED ORDER — NALBUPHINE SYRINGE 5 MG/0.5 ML
10.0000 mg | INJECTION | INTRAMUSCULAR | Status: AC
  Administered 2012-07-07: 10 mg via SUBCUTANEOUS
  Filled 2012-07-07: qty 1

## 2012-07-07 MED ORDER — HYDROCODONE-ACETAMINOPHEN 5-500 MG PO TABS: 1.0000 | ORAL_TABLET | Freq: Four times a day (QID) | ORAL | Status: DC | PRN

## 2012-07-07 NOTE — Telephone Encounter (Signed)
TC from patient--requesting same medication she received last week for pelvic pain and contractions in MAU. Reports persistence of pain, cramping--+FM, denies leaking or bleeding.  Scheduled for induction 9/27 at 41 weeks, currently 40 2/7 weeks, G3P1.  Will see in MAU for evaluation.

## 2012-07-07 NOTE — MAU Provider Note (Signed)
History   21 yo G3P1011 at 40 2/7 weeks presented c/o cramping and back pain--seen 9/16 and 9/17 in MAU for prodromal labor, with cervix 1-2 then.  Received Nubain 9/17 with benefit.  Called tonight to see if she could get another dose of that medication, due to general persistent discomfort.  Denies leaking or bleeding, reports +FM, denies dysuria.  Is scheduled for induction 9/27 in am at 41 weeks.  Patient Active Problem List  Diagnosis  . Pregnant state, incidental  . Hyperemesis  . Obesity  . Migraine  . History of chlamydia  . History of trichomoniasis  . Asthma  . Pelvic pain complicating pregnancy  . Bacterial vaginosis     Chief Complaint  Patient presents with  . Labor Eval     OB History    Grav Para Term Preterm Abortions TAB SAB Ect Mult Living   3 1 1  0 1 1 0 0 0 1      Past Medical History  Diagnosis Date  . Asthma   . Chlamydia 2008  . Gonorrhea   . Irregular periods/menstrual cycles   . Constipation, chronic 11/13/11  . Migraines     Migraines  . Depression     postpartum  . Trichomonas 2008  . Hx: UTI (urinary tract infection)     Frequently  . Obese     Past Surgical History  Procedure Date  . Wisdom tooth extraction   . Induced abortion     Family History  Problem Relation Age of Onset  . Anesthesia problems Neg Hx   . Hypotension Neg Hx   . Malignant hyperthermia Neg Hx   . Pseudochol deficiency Neg Hx   . Hypertension Mother   . Heart disease Mother   . Diabetes Mother   . Stroke Mother   . Hyperlipidemia Mother   . Hypertension Sister   . Thrombophlebitis Sister     clotting issue  . Depression Sister   . Heart disease Maternal Grandfather   . Drug abuse Father   . Alcohol abuse Maternal Uncle   . Kidney disease Maternal Uncle     failure  . Rheum arthritis Sister     arthritis vs fibromyalgia  . Heart murmur Sister   . Fibromyalgia Sister   . Depression Sister   . Hypertension Sister     History  Substance Use  Topics  . Smoking status: Never Smoker   . Smokeless tobacco: Never Used  . Alcohol Use: No    Allergies: No Known Allergies  Prescriptions prior to admission  Medication Sig Dispense Refill  . acetaminophen-codeine (TYLENOL #3) 300-30 MG per tablet Take 1-2 tablets by mouth every 4 (four) hours as needed. For pain      . albuterol (PROAIR HFA) 108 (90 BASE) MCG/ACT inhaler Inhale 2 puffs into the lungs every 4 (four) hours as needed. For shortness of breath/wheezing      . calcium carbonate (TUMS - DOSED IN MG ELEMENTAL CALCIUM) 500 MG chewable tablet Chew 2 tablets by mouth daily as needed. For heartburn      . cetirizine (ZYRTEC) 10 MG tablet Take 10 mg by mouth daily.      . Prenatal Vit-Fe Fumarate-FA (PRENATAL MULTIVITAMIN) TABS Take 1 tablet by mouth daily.      . hydrOXYzine (VISTARIL) 25 MG capsule Take 1 capsule (25 mg total) by mouth every 4 (four) hours as needed for itching.  30 capsule  0     Physical Exam  Blood pressure 122/70, pulse 85, temperature 97.7 F (36.5 C), temperature source Oral, resp. rate 20, height 5\' 7"  (1.702 m), weight 246 lb (111.585 kg), last menstrual period 09/14/2011, SpO2 100.00%.  Chest clear  Heart RRR without murmur Abd gravid, NT Pelvic--cervix posterior, loose 1 cm, 70%, vtx, -2. Ext WNL  FHR reactive, no decels UCs irregular, mild.  ED Course  IUP at 40 2/7 weeks ? Prodromal labor vs 3rd trimester discomforts Reactive NST  Plan: Will give Nubain for therapeutic rest--will re-check cervix in 1-2 hours.  If no change, will d/c home.   Nigel Bridgeman CNM, [place] 07/07/2012 12:01 AM   Addendum: Feels some better after Nubain SQ.  Still reports pain "10/10", but appears in NAD. Cervix unchanged--loose 1 cm, 70%, vtx, -2, posterior Reviewed status with patient--need to see more cervical change to warrant admission.   Will d/c home to await increased labor or induction plan for Friday, 9/27.  Labor s/s reviewed. Rx Vicodin for  pain.

## 2012-07-09 ENCOUNTER — Inpatient Hospital Stay (HOSPITAL_COMMUNITY): Payer: Medicaid Other | Admitting: Anesthesiology

## 2012-07-09 ENCOUNTER — Encounter (HOSPITAL_COMMUNITY): Payer: Self-pay | Admitting: *Deleted

## 2012-07-09 ENCOUNTER — Encounter (HOSPITAL_COMMUNITY): Payer: Self-pay | Admitting: Anesthesiology

## 2012-07-09 ENCOUNTER — Inpatient Hospital Stay (HOSPITAL_COMMUNITY)
Admission: AD | Admit: 2012-07-09 | Discharge: 2012-07-11 | DRG: 775 | Disposition: A | Discharge status: Home / Self Care | Payer: Medicaid Other | Source: Ambulatory Visit | Attending: Obstetrics and Gynecology | Admitting: Obstetrics and Gynecology

## 2012-07-09 DIAGNOSIS — R102 Pelvic and perineal pain: Secondary | ICD-10-CM

## 2012-07-09 DIAGNOSIS — E669 Obesity, unspecified: Secondary | ICD-10-CM

## 2012-07-09 DIAGNOSIS — N760 Acute vaginitis: Secondary | ICD-10-CM

## 2012-07-09 DIAGNOSIS — IMO0001 Reserved for inherently not codable concepts without codable children: Secondary | ICD-10-CM

## 2012-07-09 DIAGNOSIS — Z8619 Personal history of other infectious and parasitic diseases: Secondary | ICD-10-CM

## 2012-07-09 LAB — TYPE AND SCREEN
ABO/RH(D): O POS
Antibody Screen: NEGATIVE

## 2012-07-09 LAB — CBC
HCT: 33.9 % — ABNORMAL LOW (ref 36.0–46.0)
Hemoglobin: 10.8 g/dL — ABNORMAL LOW (ref 12.0–15.0)
MCHC: 31.9 g/dL (ref 30.0–36.0)
MCV: 80.1 fL (ref 78.0–100.0)

## 2012-07-09 MED ORDER — EPHEDRINE 5 MG/ML INJ: 10.0000 mg | INTRAVENOUS | Status: DC | PRN

## 2012-07-09 MED ORDER — HYDROXYZINE HCL 50 MG/ML IM SOLN: 50.0000 mg | Freq: Four times a day (QID) | INTRAMUSCULAR | Status: DC | PRN

## 2012-07-09 MED ORDER — LACTATED RINGERS IV SOLN
INTRAVENOUS | Status: DC
  Administered 2012-07-09 (×2): via INTRAVENOUS

## 2012-07-09 MED ORDER — ACETAMINOPHEN 325 MG PO TABS: 650.0000 mg | ORAL_TABLET | ORAL | Status: DC | PRN

## 2012-07-09 MED ORDER — PHENYLEPHRINE 40 MCG/ML (10ML) SYRINGE FOR IV PUSH (FOR BLOOD PRESSURE SUPPORT): 80.0000 ug | PREFILLED_SYRINGE | INTRAVENOUS | Status: DC | PRN

## 2012-07-09 MED ORDER — BENZOCAINE-MENTHOL 20-0.5 % EX AERO: 1.0000 "application " | INHALATION_SPRAY | CUTANEOUS | Status: DC | PRN

## 2012-07-09 MED ORDER — DIPHENHYDRAMINE HCL 25 MG PO CAPS: 25.0000 mg | ORAL_CAPSULE | Freq: Four times a day (QID) | ORAL | Status: DC | PRN

## 2012-07-09 MED ORDER — MEDROXYPROGESTERONE ACETATE 150 MG/ML IM SUSP
150.0000 mg | INTRAMUSCULAR | Status: AC | PRN
  Administered 2012-07-11: 150 mg via INTRAMUSCULAR
  Filled 2012-07-09: qty 1

## 2012-07-09 MED ORDER — INFLUENZA VIRUS VACC SPLIT PF IM SUSP
0.5000 mL | INTRAMUSCULAR | Status: AC
Start: 2012-07-10 — End: 2012-07-10
  Administered 2012-07-10: 0.5 mL via INTRAMUSCULAR
  Filled 2012-07-09: qty 0.5

## 2012-07-09 MED ORDER — LANOLIN HYDROUS EX OINT: TOPICAL_OINTMENT | CUTANEOUS | Status: DC | PRN

## 2012-07-09 MED ORDER — ONDANSETRON HCL 4 MG/2ML IJ SOLN: 4.0000 mg | Freq: Four times a day (QID) | INTRAMUSCULAR | Status: DC | PRN

## 2012-07-09 MED ORDER — OXYTOCIN BOLUS FROM INFUSION
500.0000 mL | Freq: Once | INTRAVENOUS | Status: AC
  Administered 2012-07-09: 500 mL via INTRAVENOUS
  Filled 2012-07-09: qty 500

## 2012-07-09 MED ORDER — OXYCODONE-ACETAMINOPHEN 5-325 MG PO TABS: 1.0000 | ORAL_TABLET | ORAL | Status: DC | PRN

## 2012-07-09 MED ORDER — DIPHENHYDRAMINE HCL 50 MG/ML IJ SOLN: 12.5000 mg | INTRAMUSCULAR | Status: DC | PRN

## 2012-07-09 MED ORDER — FENTANYL CITRATE 0.05 MG/ML IJ SOLN: 100.0000 ug | INTRAMUSCULAR | Status: DC | PRN

## 2012-07-09 MED ORDER — PRENATAL MULTIVITAMIN CH
1.0000 | ORAL_TABLET | Freq: Every day | ORAL | Status: DC
  Administered 2012-07-09 – 2012-07-11 (×3): 1 via ORAL
  Filled 2012-07-09 (×3): qty 1

## 2012-07-09 MED ORDER — ONDANSETRON HCL 4 MG PO TABS: 4.0000 mg | ORAL_TABLET | ORAL | Status: DC | PRN

## 2012-07-09 MED ORDER — PNEUMOCOCCAL VAC POLYVALENT 25 MCG/0.5ML IJ INJ
0.5000 mL | INJECTION | INTRAMUSCULAR | Status: AC
  Administered 2012-07-10: 0.5 mL via INTRAMUSCULAR
  Filled 2012-07-09: qty 0.5

## 2012-07-09 MED ORDER — SODIUM CHLORIDE 0.9 % IV SOLN: 250.0000 mL | INTRAVENOUS | Status: DC | PRN

## 2012-07-09 MED ORDER — CITRIC ACID-SODIUM CITRATE 334-500 MG/5ML PO SOLN: 30.0000 mL | ORAL | Status: DC | PRN

## 2012-07-09 MED ORDER — LORATADINE 10 MG PO TABS
10.0000 mg | ORAL_TABLET | Freq: Every day | ORAL | Status: DC
  Administered 2012-07-09 – 2012-07-11 (×3): 10 mg via ORAL
  Filled 2012-07-09 (×4): qty 1

## 2012-07-09 MED ORDER — SENNOSIDES-DOCUSATE SODIUM 8.6-50 MG PO TABS
2.0000 | ORAL_TABLET | Freq: Every day | ORAL | Status: DC
  Administered 2012-07-09 – 2012-07-10 (×2): 2 via ORAL

## 2012-07-09 MED ORDER — ONDANSETRON HCL 4 MG/2ML IJ SOLN: 4.0000 mg | INTRAMUSCULAR | Status: DC | PRN

## 2012-07-09 MED ORDER — OXYTOCIN 40 UNITS IN LACTATED RINGERS INFUSION - SIMPLE MED
62.5000 mL/h | Freq: Once | INTRAVENOUS | Status: DC
  Filled 2012-07-09: qty 1000

## 2012-07-09 MED ORDER — DIBUCAINE 1 % RE OINT: 1.0000 "application " | TOPICAL_OINTMENT | RECTAL | Status: DC | PRN

## 2012-07-09 MED ORDER — IBUPROFEN 600 MG PO TABS: 600.0000 mg | ORAL_TABLET | Freq: Four times a day (QID) | ORAL | Status: DC | PRN

## 2012-07-09 MED ORDER — EPHEDRINE 5 MG/ML INJ
10.0000 mg | INTRAVENOUS | Status: DC | PRN
  Filled 2012-07-09: qty 4

## 2012-07-09 MED ORDER — FENTANYL 2.5 MCG/ML BUPIVACAINE 1/10 % EPIDURAL INFUSION (WH - ANES)
INTRAMUSCULAR | Status: DC | PRN
  Administered 2012-07-09: 14 mL/h via EPIDURAL

## 2012-07-09 MED ORDER — PRENATAL MULTIVITAMIN CH: 1.0000 | ORAL_TABLET | Freq: Every day | ORAL | Status: DC

## 2012-07-09 MED ORDER — SODIUM BICARBONATE 8.4 % IV SOLN
INTRAVENOUS | Status: DC | PRN
  Administered 2012-07-09: 4 mL via EPIDURAL

## 2012-07-09 MED ORDER — LACTATED RINGERS IV SOLN
500.0000 mL | Freq: Once | INTRAVENOUS | Status: AC
  Administered 2012-07-09: 500 mL via INTRAVENOUS

## 2012-07-09 MED ORDER — WITCH HAZEL-GLYCERIN EX PADS: 1.0000 "application " | MEDICATED_PAD | CUTANEOUS | Status: DC | PRN

## 2012-07-09 MED ORDER — PHENYLEPHRINE 40 MCG/ML (10ML) SYRINGE FOR IV PUSH (FOR BLOOD PRESSURE SUPPORT)
80.0000 ug | PREFILLED_SYRINGE | INTRAVENOUS | Status: DC | PRN
  Filled 2012-07-09: qty 5

## 2012-07-09 MED ORDER — FENTANYL 2.5 MCG/ML BUPIVACAINE 1/10 % EPIDURAL INFUSION (WH - ANES)
14.0000 mL/h | INTRAMUSCULAR | Status: DC
  Administered 2012-07-09: 14 mL/h via EPIDURAL
  Filled 2012-07-09 (×2): qty 60

## 2012-07-09 MED ORDER — OXYCODONE-ACETAMINOPHEN 5-325 MG PO TABS
1.0000 | ORAL_TABLET | ORAL | Status: DC | PRN
  Administered 2012-07-09 – 2012-07-10 (×4): 1 via ORAL
  Administered 2012-07-10: 2 via ORAL
  Administered 2012-07-10: 1 via ORAL
  Administered 2012-07-10 – 2012-07-11 (×3): 2 via ORAL
  Filled 2012-07-09: qty 2
  Filled 2012-07-09: qty 1
  Filled 2012-07-09: qty 2
  Filled 2012-07-09: qty 1
  Filled 2012-07-09: qty 2
  Filled 2012-07-09: qty 1
  Filled 2012-07-09: qty 2
  Filled 2012-07-09 (×2): qty 1

## 2012-07-09 MED ORDER — SODIUM CHLORIDE 0.9 % IJ SOLN: 3.0000 mL | INTRAMUSCULAR | Status: DC | PRN

## 2012-07-09 MED ORDER — HYDROXYZINE HCL 50 MG PO TABS: 50.0000 mg | ORAL_TABLET | Freq: Four times a day (QID) | ORAL | Status: DC | PRN

## 2012-07-09 MED ORDER — SODIUM CHLORIDE 0.9 % IJ SOLN: 3.0000 mL | Freq: Two times a day (BID) | INTRAMUSCULAR | Status: DC

## 2012-07-09 MED ORDER — TETANUS-DIPHTH-ACELL PERTUSSIS 5-2.5-18.5 LF-MCG/0.5 IM SUSP
0.5000 mL | Freq: Once | INTRAMUSCULAR | Status: AC
  Administered 2012-07-10: 0.5 mL via INTRAMUSCULAR
  Filled 2012-07-09: qty 0.5

## 2012-07-09 MED ORDER — ZOLPIDEM TARTRATE 5 MG PO TABS: 5.0000 mg | ORAL_TABLET | Freq: Every evening | ORAL | Status: DC | PRN

## 2012-07-09 MED ORDER — IBUPROFEN 600 MG PO TABS
600.0000 mg | ORAL_TABLET | Freq: Four times a day (QID) | ORAL | Status: DC
  Administered 2012-07-09 – 2012-07-11 (×8): 600 mg via ORAL
  Filled 2012-07-09 (×8): qty 1

## 2012-07-09 MED ORDER — SIMETHICONE 80 MG PO CHEW: 80.0000 mg | CHEWABLE_TABLET | ORAL | Status: DC | PRN

## 2012-07-09 MED ORDER — LIDOCAINE HCL (PF) 1 % IJ SOLN
30.0000 mL | INTRAMUSCULAR | Status: DC | PRN
  Filled 2012-07-09: qty 30

## 2012-07-09 MED ORDER — LACTATED RINGERS IV SOLN: 500.0000 mL | INTRAVENOUS | Status: DC | PRN

## 2012-07-09 NOTE — Progress Notes (Signed)
Patient ID: Margaret Cline, female   DOB: 24-Oct-1990, 21 y.o.   MRN: [id number] Feels pressure  Filed Vitals:   07/09/12 1200  BP: 101/56  Pulse: 81  Temp: 97.3 F (36.3 C)  Resp: 19   9/100/0 vtx FHR cat 1  A/P Cont expectant mgmt Fetal status is reassuring

## 2012-07-09 NOTE — H&P (Addendum)
Margaret Cline is a 21 y.o. female presenting c/o contractions since about 1am.  She was admitted early morning and I received sign out at 7am and informed pt was 4cm within the previous hour.  Denies LOF, +bloody show. Maternal Medical History:  Reason for admission: Reason for admission: contractions.  Fetal activity: Perceived fetal activity is normal.    Prenatal complications: no prenatal complications   OB History    Grav Para Term Preterm Abortions TAB SAB Ect Mult Living   3 1 1  0 1 1 0 0 0 1     Past Medical History  Diagnosis Date  . Asthma   . Chlamydia 2008  . Gonorrhea   . Irregular periods/menstrual cycles   . Constipation, chronic 11/13/11  . Migraines     Migraines  . Depression     postpartum  . Trichomonas 2008  . Hx: UTI (urinary tract infection)     Frequently  . Obese    Past Surgical History  Procedure Date  . Wisdom tooth extraction   . Induced abortion    Family History: family history includes Alcohol abuse in her maternal uncle; Depression in her sisters; Diabetes in her mother; Drug abuse in her father; Fibromyalgia in her sister; Heart disease in her maternal grandfather and mother; Heart murmur in her sister; Hyperlipidemia in her mother; Hypertension in her mother and sisters; Kidney disease in her maternal uncle; Rheum arthritis in her sister; Stroke in her mother; and Thrombophlebitis in her sister.  There is no history of Anesthesia problems, and Hypotension, and Malignant hyperthermia, and Pseudochol deficiency, . Social History:  reports that she has never smoked. She has never used smokeless tobacco. She reports that she does not drink alcohol or use illicit drugs.   Prenatal Transfer Tool  Maternal Diabetes: No Genetic Screening: not done Maternal Ultrasounds/Referrals: n/a Fetal Ultrasounds or other Referrals:  Normal anatomy scan Maternal Substance Abuse:  No Significant Maternal Medications:  None Significant Maternal Lab Results:   None Other Comments:  None  ROS  Dilation: 8 Effacement (%): 0 Station: -1 Exam by:: Dr Su Hilt Blood pressure 107/77, pulse 93, temperature 97.4 F (36.3 C), temperature source Oral, resp. rate 20, height 5\' 7"  (1.702 m), weight 246 lb 6.4 oz (111.766 kg), last menstrual period 09/14/2011, SpO2 100.00%. Exam Physical Exam  Prenatal labs: ABO, Rh: O/Positive/-- (02/07 0000) Antibody: Negative (02/07 0000) Rubella: Immune (02/07 0000) RPR: NON REAC (07/05 1148)  HBsAg: Negative (02/07 0000)  HIV: Non-reactive (02/07 0000)  GBS: NEGATIVE (07/15 1409)   Assessment/Plan: 21 yo P1 admitted in labor.  Progressing well with Cat 1 tracing.  Pt is currently comfortable with epidural.  Fetal status is overall reassuring.  Cont expectant mgmt.   Teffany Blaszczyk Y 07/09/2012, 10:10 AM

## 2012-07-09 NOTE — Anesthesia Preprocedure Evaluation (Signed)
Anesthesia Evaluation    Airway Mallampati: III      Dental   Pulmonary asthma ,          Cardiovascular     Neuro/Psych    GI/Hepatic   Endo/Other  Morbid obesity  Renal/GU      Musculoskeletal   Abdominal   Peds  Hematology   Anesthesia Other Findings   Reproductive/Obstetrics                           Anesthesia Physical Anesthesia Plan  ASA: III  Anesthesia Plan: Epidural   Post-op Pain Management:    Induction:   Airway Management Planned:   Additional Equipment:   Intra-op Plan:   Post-operative Plan:   Informed Consent: I have reviewed the patients History and Physical, chart, labs and discussed the procedure including the risks, benefits and alternatives for the proposed anesthesia with the patient or authorized representative who has indicated his/her understanding and acceptance.   Dental Advisory Given  Plan Discussed with:   Anesthesia Plan Comments: (Labs checked- platelets confirmed with RN in room. Fetal heart tracing, per RN, reported to be stable enough for sitting procedure. Discussed epidural, and patient consents to the procedure:  included risk of possible headache,backache, failed block, allergic reaction, and nerve injury. This patient was asked if she had any questions or concerns before the procedure started. )        Anesthesia Quick Evaluation

## 2012-07-09 NOTE — Progress Notes (Signed)
Dr Su Hilt called for update. Notified G3P1 @ 40.5 weeks here for labor starting ~0100. @ 0645 SVE 4-5 cm, waiting for epidural. Wants to recheck cervix. SVE while holding, notified 6 cm with BBOW.

## 2012-07-09 NOTE — MAU Note (Signed)
Pt G3 P1 at 40.5wks having frequent contractions with bloody show.

## 2012-07-09 NOTE — Anesthesia Procedure Notes (Signed)
Epidural Patient location during procedure: OB  Preanesthetic Checklist Completed: patient identified, site marked, surgical consent, pre-op evaluation, timeout performed, IV checked, risks and benefits discussed and monitors and equipment checked  Epidural Patient position: sitting Prep: site prepped and draped and DuraPrep Patient monitoring: continuous pulse ox and blood pressure Approach: midline Injection technique: LOR air  Needle:  Needle type: Tuohy  Needle gauge: 17 G Needle length: 9 cm and 9 Needle insertion depth: 7 cm Catheter type: closed end flexible Catheter size: 19 Gauge Catheter at skin depth: 14 cm Test dose: negative  Assessment Events: blood not aspirated, injection not painful, no injection resistance, negative IV test and no paresthesia  Additional Notes Dosing of Epidural:  1st dose, through needle ............................................. epi 1:200K + Xylocaine 40 mg  2nd dose, through catheter, after waiting 3 minutes.....epi 1:200K + Xylocaine 40 mg  3rd dose, through catheter after waiting 3 minutes .............................Marcaine   4mg   ( mg Marcaine are expressed as equivilent  cc's medication removed from the 0.1%Bupiv / fentanyl syringe from L&D pump)  ( 2% Xylo charted as a single dose in Epic Meds for ease of charting; actual dosing was fractionated as above, for saftey's sake)  As each dose occurred, patient was free of IV sx; and patient exhibited no evidence of SA injection.  Patient is more comfortable after epidural dosed. Please see RN's note for documentation of vital signs,and FHR which are stable.  Patient reminded not to try to ambulate with numb legs, and that an RN must be present the 1st time she attempts to get up.    

## 2012-07-10 ENCOUNTER — Encounter: Payer: Medicaid Other | Admitting: Obstetrics and Gynecology

## 2012-07-10 LAB — CBC
HCT: 29.7 % — ABNORMAL LOW (ref 36.0–46.0)
Hemoglobin: 9.7 g/dL — ABNORMAL LOW (ref 12.0–15.0)
MCH: 26.3 pg (ref 26.0–34.0)
MCHC: 32.7 g/dL (ref 30.0–36.0)
MCV: 80.5 fL (ref 78.0–100.0)
RBC: 3.69 MIL/uL — ABNORMAL LOW (ref 3.87–5.11)

## 2012-07-10 LAB — CCBB MATERNAL DONOR DRAW

## 2012-07-10 NOTE — Progress Notes (Signed)
UR chart review completed.  

## 2012-07-10 NOTE — Progress Notes (Signed)
Post Partum Day 1 Subjective: no complaints, voiding and tolerating PO  Objective: Blood pressure 107/71, pulse 85, temperature 98 F (36.7 C), temperature source Oral, resp. rate 20, height 5\' 7"  (1.702 m), weight 246 lb 6.4 oz (111.766 kg), last menstrual period 09/14/2011, SpO2 100.00%, unknown if currently breastfeeding.  Physical Exam:  General: no distress Lochia: appropriate Uterine Fundus: firm Incision: NA DVT Evaluation: No evidence of DVT seen on physical exam.   Basename 07/10/12 0520 07/09/12 0705  HGB 9.7* 10.8*  HCT 29.7* 33.9*    Assessment/Plan: Plan for discharge tomorrow and Contraception DepoProvera   LOS: 1 day   Nioma Mccubbins V 07/10/2012, 11:28 AM

## 2012-07-11 ENCOUNTER — Encounter (HOSPITAL_COMMUNITY): Payer: Self-pay | Admitting: Obstetrics and Gynecology

## 2012-07-11 ENCOUNTER — Inpatient Hospital Stay (HOSPITAL_COMMUNITY): Admission: RE | Admit: 2012-07-11 | Payer: Medicaid Other | Source: Ambulatory Visit

## 2012-07-11 MED ORDER — IBUPROFEN 600 MG PO TABS: 600.0000 mg | ORAL_TABLET | Freq: Four times a day (QID) | ORAL | Status: DC | PRN

## 2012-07-11 MED ORDER — MEDROXYPROGESTERONE ACETATE 150 MG/ML IM SUSP: 150.0000 mg | Freq: Once | INTRAMUSCULAR | Status: DC

## 2012-07-11 MED ORDER — OXYCODONE-ACETAMINOPHEN 5-325 MG PO TABS: 1.0000 | ORAL_TABLET | ORAL | Status: DC | PRN

## 2012-07-11 MED ORDER — ALBUTEROL SULFATE HFA 108 (90 BASE) MCG/ACT IN AERS
2.0000 | INHALATION_SPRAY | RESPIRATORY_TRACT | Status: DC | PRN
  Administered 2012-07-11: 2 via RESPIRATORY_TRACT
  Filled 2012-07-11: qty 6.7

## 2012-07-11 NOTE — Clinical Social Work Maternal (Signed)
    Clinical Social Work Department PSYCHOSOCIAL ASSESSMENT - MATERNAL/CHILD 07/11/2012  Patient:  Margaret Cline, Margaret Cline  Account Number:  [id number]  Admit Date:  07/09/2012  Marjo Bicker Name:   Dario Guardian    Clinical Social Worker:  Andy Gauss   Date/Time:  07/11/2012 12:36 PM  Date Referred:  07/11/2012   Referral source  CN     Referred reason  Behavioral Health Issues   Other referral source:    I:  FAMILY / HOME ENVIRONMENT Child's legal guardian:  PARENT  Guardian - Name Guardian - Age Guardian - Address  Margaret Cline 21 [address] [place], [place] [address]  Bard Herbert [address], [place]   Other household support members/support persons Name Relationship DOB   SON 05/2007   Other support:    II  PSYCHOSOCIAL DATA Information Source:  Patient Interview  Event organiser Employment:   Financial resources:  [employer] If Medicaid - County:  [place] Other  [facility]  Food Stamps   School / Grade:   Maternity Care Coordinator / Child Services Coordination / Early Interventions:  Cultural issues impacting care:    III  STRENGTHS Strengths  Adequate Resources  Home prepared for Child (including basic supplies)  Supportive family/friends   Strength comment:    IV  RISK FACTORS AND CURRENT PROBLEMS Current Problem:  YES   Risk Factor & Current Problem Patient Issue Family Issue Risk Factor / Current Problem Comment  Mental Illness Y N Hx of PP depression    V  SOCIAL WORK ASSESSMENT Pt admits that she experienced PP depression symptoms after the birth of her son.  She remembers feeling overwhelmed with little to no support.  Her symptoms were not treated with medication however she did participate in counseling. According to the pt, counseling sessions were helpful.  She denies any depression since then or history of SI. Identified FOB is at the bedside and supportive, as per pt. Paternity issues are resolved.  She has all the necessary  supplies for the infant.  Pt agrees to seek counseling services again, should PP depression symptoms arise.  Sw available to assist further if needed.      VI SOCIAL WORK PLAN Social Work Plan  No Further Intervention Required / No Barriers to Discharge   Type of pt/family education:   If child protective services report - county:   If child protective services report - date:   Information/referral to community resources comment:   Other social work plan:

## 2012-07-11 NOTE — Discharge Summary (Signed)
Obstetric Discharge Summary Reason for Admission: onset of labor Prenatal Procedures: NST and ultrasound Intrapartum Procedures: spontaneous vaginal delivery, shoulder dystocia--relieved with series of maneuvers Postpartum Procedures: none Complications-Operative and Postpartum: none Hemoglobin  Date Value Range Status  07/10/2012 9.7* 12.0 - 15.0 g/dL Final     HCT  Date Value Range Status  07/10/2012 29.7* 36.0 - 46.0 % Final    Physical Exam:  General: alert Lochia: appropriate Uterine Fundus: firm Incision: Perineum clear DVT Evaluation: No evidence of DVT seen on physical exam. Negative Homan's sign.  Discharge Diagnoses: Term Pregnancy-delivered  Discharge Information: Date: 07/11/2012 Activity: Per [facility] handout Diet: routine Medications: Ibuprofen and Percocet Condition: stable Instructions: refer to practice specific booklet Discharge to: home Contraception:  DepoProvera at discharge--will decide if wants to continue, will inform us at 6 week pp visit.  Follow-up Information    Follow up with [facility]. Schedule an appointment as soon as possible for a visit in 6 weeks. (Call with any questions or concerns)    Contact information:   [address] [place] [address] [phone]        Hospital Course:  Admitted in labor on 07/09/12 in active labor. Negative GBS. Progressed rapidly. Utilized epidural for pain management.  Delivery was performed by Dr Osborn Coho per patient request, with shoulder dystocia noted--relieved with Woods screw maneuver. Patient and baby tolerated the procedure without difficulty, with no laceration noted. Infant status was stable and remained in room with mother.  Mother and infant then had an uncomplicated postpartum course, with breast feeding going well. Mom's physical exam was WNL, and she was discharged home in stable condition. Contraception plan was DepoProvera  initially, receiving 1st dose at d/c--she will decide if she wishes to continue and will review her status at the 6 week visit.  She received adequate benefit from po pain medications--was using both Motrin and Percocet due to back and pelvic pain.  May have had mild separation of pubic symphysis, since provider heard a "pop" at the time of delivery, but infant had no clavicular fracture.  Patient will f/u as needed if no improvement.  Able to ambulate and move without limitation.  Has hx chronic pelvic pain with pregnancy.   Newborn Data: Live born female  Birth Weight: 8 lb 1.6 oz (3674 g) APGAR: 9, 9  Home with mother.  Nigel Bridgeman 07/11/2012, 9:28 AM

## 2012-07-16 ENCOUNTER — Telehealth: Payer: Self-pay | Admitting: Obstetrics and Gynecology

## 2012-07-16 NOTE — Telephone Encounter (Signed)
Returned pt's call. Informed of instructions per MK.  Pt verbalizes comprehension.Scheduled for appt 07/17/12 with Dr AVS. States is not at home to check # of Percocet left, but should have sufficient supply until appt.

## 2012-07-16 NOTE — Telephone Encounter (Signed)
TC to pt. States has had back pain since delivery 07/09/12. States was told in hospital that may be due to epidural or manuevering done during delivery. Has relief with Ibuprofen q 6 h, Percocet q 4 h and heat but is almost out of Percocet. Per MK, pt to also try ice pack x 20 min. May have additional pain med if needed. Needs appt 07/17/12. TC to pt. LM to return call.

## 2012-07-17 ENCOUNTER — Encounter: Payer: Self-pay | Admitting: Obstetrics and Gynecology

## 2012-07-17 ENCOUNTER — Ambulatory Visit (INDEPENDENT_AMBULATORY_CARE_PROVIDER_SITE_OTHER): Payer: Medicaid Other | Admitting: Obstetrics and Gynecology

## 2012-07-17 VITALS — BP 102/64 | Temp 98.8°F | Ht 67.0 in | Wt 229.0 lb

## 2012-07-17 DIAGNOSIS — M549 Dorsalgia, unspecified: Secondary | ICD-10-CM

## 2012-07-17 LAB — POCT URINALYSIS DIPSTICK
Bilirubin, UA: NEGATIVE
Ketones, UA: NEGATIVE
Leukocytes, UA: NEGATIVE
Protein, UA: NEGATIVE
Spec Grav, UA: 1.01

## 2012-07-17 NOTE — Progress Notes (Signed)
HISTORY OF PRESENT ILLNESS  [name] is a 21 y.o. year old female,G3P2012, who presents for a problem visit. The patient had a vaginal delivery on June 08, 2012 .  She had an epidural catheter placed for pain management during labor and delivery.  Subjective:  She reports having mid back pain that makes her stop what she is doing.  Normal recovery otherwise.  She has tried heat, ice, and pain medication.  Objective:  BP 102/64  Temp 98.8 F (37.1 C) (Oral)  Ht 5\' 7"  (1.702 m)  Wt 229 lb (103.874 kg)  BMI 35.87 kg/m2  Breastfeeding? Yes   General: no distress GI: soft and nontender back: No CVA tenderness, full range of motion, no masses, point tenderness in mid back.  Exam deferred.  UA: Negative  Assessment:  Mid back pain after a vaginal delivery.  Uncertain etiology.  Plan:  The patient is referred to the Anesthesia Department and placed her epidural.  She was also told she may need to see her family physician  Return to office in 4 week(s).   Leonard Schwartz M.D.  07/17/2012 9:37 AM

## 2012-07-30 ENCOUNTER — Emergency Department (HOSPITAL_COMMUNITY)
Admission: EM | Admit: 2012-07-30 | Discharge: 2012-07-30 | Disposition: A | Discharge status: Home / Self Care | Payer: Medicaid Other

## 2012-07-30 NOTE — ED Notes (Signed)
Patient called to be triage but no answer times 2.

## 2012-08-04 ENCOUNTER — Other Ambulatory Visit: Payer: Self-pay | Admitting: Obstetrics and Gynecology

## 2012-08-04 ENCOUNTER — Telehealth: Payer: Self-pay | Admitting: Obstetrics and Gynecology

## 2012-08-04 DIAGNOSIS — M549 Dorsalgia, unspecified: Secondary | ICD-10-CM

## 2012-08-04 MED ORDER — OXYCODONE-ACETAMINOPHEN 5-325 MG PO TABS: 1.0000 | ORAL_TABLET | ORAL | Status: DC | PRN

## 2012-08-04 NOTE — Telephone Encounter (Signed)
TC from patient to [facility]--"was told to call hospital to schedule appointment with Anesthesia to discuss persistent back pain at epidural site. Saw AVS 10/3, with note indicating referral to Anesthesia, "may need to see family MD". I happened to be in [facility] when the call came in, and spoke with the patient per phone.  Plan: Will give RX for pain medication until Anesthesia consult can be arranged. Rx Percocet 5/325 1-2 po q 4 hour prn pain, #24, no refills.  Patient will pick up at [facility] tonight. Will work with office tomorrow regarding Anesthesia consult.

## 2012-08-06 ENCOUNTER — Telehealth: Payer: Self-pay | Admitting: Obstetrics and Gynecology

## 2012-08-06 ENCOUNTER — Telehealth (HOSPITAL_COMMUNITY): Payer: Self-pay | Admitting: Anesthesiology

## 2012-08-06 NOTE — Telephone Encounter (Signed)
Message copied by Mason Jim on Wed Aug 06, 2012  9:07 AM ------      Message from: Cornelius Moras      Created: Mon Aug 04, 2012  5:39 PM      Regarding: Anesthesia consult/referral       From visit 07/17/12, AVS referred patient to Anesthesia for persistent back pain at the site of her previous epidural.      See my TC note from today--please call Anesthesia and find out how they would proceed.      Does the patient just need to show up at [facility] for Anesthesia consult, or do they have another process they would prefer?      VL

## 2012-08-06 NOTE — Telephone Encounter (Signed)
TC to pt. Informed to be contacted by anesthesia regarding back pain. Pt verbalizes comprehension.

## 2012-08-06 NOTE — Progress Notes (Signed)
Attempted telephone call. Message left K. Jean Rosenthal

## 2012-08-06 NOTE — Telephone Encounter (Signed)
TC to DR Malen Gauze, anesthesia [facility]. Informed pt having back pain since delivery 07/09/12. Had epidural. Requesting anesthesia consult.  Per DR Malen Gauze, pt will be contacted for F/U.

## 2012-08-06 NOTE — Addendum Note (Signed)
Addendum  created 08/06/12 1857 by Jiles Garter, MD   Modules edited:Inpatient Notes

## 2012-08-08 ENCOUNTER — Telehealth: Payer: Self-pay | Admitting: Obstetrics and Gynecology

## 2012-08-08 NOTE — Consults (Signed)
Phone discussion with patient this am. Describes Back Pain; no numbness or weakness reported. I offered her to be seen in [facility]. Suggest return to [facility] office for referral to PT/long term f/u.

## 2012-08-12 ENCOUNTER — Encounter: Payer: Self-pay | Admitting: Obstetrics and Gynecology

## 2012-08-12 ENCOUNTER — Ambulatory Visit (INDEPENDENT_AMBULATORY_CARE_PROVIDER_SITE_OTHER): Payer: Medicaid Other | Admitting: Obstetrics and Gynecology

## 2012-08-12 MED ORDER — OXYCODONE-ACETAMINOPHEN 5-325 MG PO TABS: 1.0000 | ORAL_TABLET | Freq: Four times a day (QID) | ORAL | Status: DC | PRN

## 2012-08-12 NOTE — Progress Notes (Signed)
Date of delivery: 07/09/2012 Female Name: Margaret Cline Vaginal delivery:yes Cesarean section:no Tubal ligation:no GDM:no Breast Feeding:no Bottle Feeding:yes Post-Partum Blues:no Abnormal pap:yes Normal GU function: yes Normal GI function:yes Returning to work:no EPDS: Score = 6  Wants nexplanon.  Has low back pain.  Anesthesia rec pt.  Filed Vitals:   08/12/12 1104  BP: 110/60  Temp: 98.4 F (36.9 C)   ROS: noncontributory  Pelvic exam:  VULVA: normal appearing vulva with no masses, tenderness or lesions,  VAGINA: normal appearing vagina with normal color and discharge, no lesions, CERVIX: normal appearing cervix without discharge or lesions,  UTERUS: uterus is normal size, shape, consistency and nontender,  ADNEXA: normal adnexa in size, nontender and no masses.  A/P Refer for PT Pt requesting refill of percocet for back pain - will give another 30 tabs - counseling done Next week for nexplanon

## 2012-08-15 NOTE — Telephone Encounter (Signed)
Had office visit 08/12/12.

## 2012-08-26 ENCOUNTER — Encounter: Payer: Self-pay | Admitting: Obstetrics and Gynecology

## 2012-08-26 ENCOUNTER — Ambulatory Visit (INDEPENDENT_AMBULATORY_CARE_PROVIDER_SITE_OTHER): Payer: Medicaid Other | Admitting: Obstetrics and Gynecology

## 2012-08-26 VITALS — BP 110/72 | HR 78 | Wt 229.0 lb

## 2012-08-26 DIAGNOSIS — Z30017 Encounter for initial prescription of implantable subdermal contraceptive: Secondary | ICD-10-CM

## 2012-08-26 LAB — POCT URINE PREGNANCY: Preg Test, Ur: NEGATIVE

## 2012-08-26 MED ORDER — ETONOGESTREL 68 MG ~~LOC~~ IMPL
68.0000 mg | DRUG_IMPLANT | Freq: Once | SUBCUTANEOUS | Status: AC
  Administered 2012-08-26: 68 mg via SUBCUTANEOUS

## 2012-08-26 NOTE — Progress Notes (Signed)
21 YO for Nexplanon insertion. Patient S/P 07/09/12 SVB.  Reviewed Nexplanon: effectiveness, MOA, side effects, R & B,  patient had no questions.  Currently on Depo Provera.   O: Nexplanon inserted per protocol in medial left upper arm without difficulty; dressed with sterile band-aid and 4 x 4 pressure bandage with Kling.  UPT-negative  A: Nexplanon Inserted  P: Reviewed wound care and signs and symptoms of infection       RTO-1 week for follow up  Cherrie Franca,PA-C

## 2012-08-26 NOTE — Patient Instructions (Addendum)
Call [facility] [phone]:  -for temperature of 100.4 degrees Fahrenheit or more -pain not improved with over the counter pain medications (Ibuprofen, Advil, Aleve,     Tylenol or acetaminophen) -for excessive bleeding from insertion site -for excessive swelling redness or green drainage from your insertion site -for any other concerns -keep insertion site clean, dry and covered  for 24 hours -you may remove pressure bandage in 1-4 hours  Use a back-up method of birth control for the next 4 weeks  

## 2012-08-28 ENCOUNTER — Telehealth (HOSPITAL_COMMUNITY): Payer: Self-pay | Admitting: Anesthesiology

## 2012-09-02 ENCOUNTER — Encounter: Payer: Medicaid Other | Admitting: Obstetrics and Gynecology

## 2012-09-03 ENCOUNTER — Emergency Department (HOSPITAL_COMMUNITY)
Admission: EM | Admit: 2012-09-03 | Discharge: 2012-09-03 | Disposition: A | Discharge status: Home / Self Care | Payer: Medicaid Other | Attending: Emergency Medicine | Admitting: Emergency Medicine

## 2012-09-03 ENCOUNTER — Emergency Department (HOSPITAL_COMMUNITY): Payer: Medicaid Other

## 2012-09-03 ENCOUNTER — Encounter (HOSPITAL_COMMUNITY): Payer: Self-pay

## 2012-09-03 DIAGNOSIS — G8929 Other chronic pain: Secondary | ICD-10-CM

## 2012-09-03 DIAGNOSIS — R079 Chest pain, unspecified: Secondary | ICD-10-CM | POA: Insufficient documentation

## 2012-09-03 DIAGNOSIS — J45909 Unspecified asthma, uncomplicated: Secondary | ICD-10-CM | POA: Insufficient documentation

## 2012-09-03 DIAGNOSIS — Z87440 Personal history of urinary (tract) infections: Secondary | ICD-10-CM | POA: Insufficient documentation

## 2012-09-03 DIAGNOSIS — F329 Major depressive disorder, single episode, unspecified: Secondary | ICD-10-CM | POA: Insufficient documentation

## 2012-09-03 DIAGNOSIS — E669 Obesity, unspecified: Secondary | ICD-10-CM | POA: Insufficient documentation

## 2012-09-03 DIAGNOSIS — Z79899 Other long term (current) drug therapy: Secondary | ICD-10-CM | POA: Insufficient documentation

## 2012-09-03 DIAGNOSIS — M549 Dorsalgia, unspecified: Secondary | ICD-10-CM | POA: Insufficient documentation

## 2012-09-03 DIAGNOSIS — F3289 Other specified depressive episodes: Secondary | ICD-10-CM | POA: Insufficient documentation

## 2012-09-03 DIAGNOSIS — Z8679 Personal history of other diseases of the circulatory system: Secondary | ICD-10-CM | POA: Insufficient documentation

## 2012-09-03 DIAGNOSIS — Z8742 Personal history of other diseases of the female genital tract: Secondary | ICD-10-CM | POA: Insufficient documentation

## 2012-09-03 LAB — URINALYSIS, ROUTINE W REFLEX MICROSCOPIC
Nitrite: NEGATIVE
Specific Gravity, Urine: 1.038 — ABNORMAL HIGH (ref 1.005–1.030)
Urobilinogen, UA: 0.2 mg/dL (ref 0.0–1.0)
pH: 6 (ref 5.0–8.0)

## 2012-09-03 LAB — CBC WITH DIFFERENTIAL/PLATELET
Basophils Absolute: 0 10*3/uL (ref 0.0–0.1)
Basophils Relative: 0 % (ref 0–1)
HCT: 40.8 % (ref 36.0–46.0)
Hemoglobin: 13.5 g/dL (ref 12.0–15.0)
Lymphocytes Relative: 34 % (ref 12–46)
MCHC: 33.1 g/dL (ref 30.0–36.0)
Monocytes Relative: 4 % (ref 3–12)
Neutro Abs: 5.6 10*3/uL (ref 1.7–7.7)
Neutrophils Relative %: 61 % (ref 43–77)
RBC: 5.06 MIL/uL (ref 3.87–5.11)
WBC: 9.1 10*3/uL (ref 4.0–10.5)

## 2012-09-03 LAB — BASIC METABOLIC PANEL
BUN: 13 mg/dL (ref 6–23)
Chloride: 103 mEq/L (ref 96–112)
GFR calc Af Amer: >90 mL/min (ref >90)
GFR calc non Af Amer: >90 mL/min (ref >90)
Potassium: 3.7 mEq/L (ref 3.5–5.1)
Sodium: 139 mEq/L (ref 135–145)

## 2012-09-03 LAB — URINE MICROSCOPIC-ADD ON

## 2012-09-03 MED ORDER — HYDROCODONE-ACETAMINOPHEN 5-325 MG PO TABS: 1.0000 | ORAL_TABLET | ORAL | Status: DC | PRN

## 2012-09-03 NOTE — ED Notes (Signed)
Patient reports that she began having chest heaviness that occurs when she breathes and began 3 weeks ago. Paitent denies any N/V, diaphoresis, but does c/o dizziness. Patient also c/o low back pain. Patient reports that she is out of her Percocet and was going to refer her to a pain clinic, but she is on medicaid. Patient c/o sharp pain in both legs that radiates to her back

## 2012-09-03 NOTE — ED Notes (Signed)
Pt ambulating to xray

## 2012-09-03 NOTE — ED Provider Notes (Signed)
History     CSN: [id number]  Arrival date & time 09/03/12  0803   First MD Initiated Contact with Patient 09/03/12 [phone]      Chief Complaint  Patient presents with  . Chest Pain  . Back Pain    (Consider location/radiation/quality/duration/timing/severity/associated sxs/prior treatment) HPI Comments: Shanterica Quain is a 21 y.o. Female who is here for evaluation of chest pain, for 3 weeks. Chest pain is worse with deep breathing. It is present daily. She denies cough, shortness of breath, or back pain. She occasionally feels dizzy. She has ongoing, right leg pain. That is worse since she ran out of her Percocet. The leg. Pain is worse with ambulation. It improves with rest. She denies change in bowel or urinary habits. No fever, or chills. Last menstrual period was 07/09/2012.  Patient is a 21 y.o. female presenting with chest pain and back pain. The history is provided by the patient.  Chest Pain    Back Pain  Associated symptoms include chest pain.    Past Medical History  Diagnosis Date  . Asthma   . Chlamydia 2008  . Gonorrhea   . Irregular periods/menstrual cycles   . Constipation, chronic 11/13/11  . Migraines     Migraines  . Depression     postpartum  . Trichomonas 2008  . Hx: UTI (urinary tract infection)     Frequently  . Obese     Past Surgical History  Procedure Date  . Wisdom tooth extraction   . Induced abortion     Family History  Problem Relation Age of Onset  . Anesthesia problems Neg Hx   . Hypotension Neg Hx   . Malignant hyperthermia Neg Hx   . Pseudochol deficiency Neg Hx   . Hypertension Mother   . Heart disease Mother   . Diabetes Mother   . Stroke Mother   . Hyperlipidemia Mother   . Hypertension Sister   . Thrombophlebitis Sister     clotting issue  . Depression Sister   . Heart disease Maternal Grandfather   . Drug abuse Father   . Alcohol abuse Maternal Uncle   . Kidney disease Maternal Uncle     failure  . Rheum arthritis  Sister     arthritis vs fibromyalgia  . Heart murmur Sister   . Fibromyalgia Sister   . Depression Sister   . Hypertension Sister     History  Substance Use Topics  . Smoking status: Never Smoker   . Smokeless tobacco: Never Used  . Alcohol Use: Yes     Comment: occasional    OB History    Grav Para Term Preterm Abortions TAB SAB Ect Mult Living   3 2 2  0 1 1 0 0 0 2      Review of Systems  Cardiovascular: Positive for chest pain.  Musculoskeletal: Positive for back pain.  All other systems reviewed and are negative.    Allergies  Review of patient's allergies indicates no known allergies.  Home Medications   Current Outpatient Rx  Name  Route  Sig  Dispense  Refill  . ALBUTEROL SULFATE HFA 108 (90 BASE) MCG/ACT IN AERS   Inhalation   Inhale 2 puffs into the lungs every 4 (four) hours as needed. For shortness of breath/wheezing         . CETIRIZINE HCL 10 MG PO TABS   Oral   Take 10 mg by mouth daily.         . IBUPROFEN  600 MG PO TABS   Oral   Take 1 tablet (600 mg total) by mouth every 6 (six) hours as needed for pain.   36 tablet   2   . PRENATAL MULTIVITAMIN CH   Oral   Take 1 tablet by mouth daily.         Marland Kitchen HYDROCODONE-ACETAMINOPHEN 5-325 MG PO TABS   Oral   Take 1 tablet by mouth every 4 (four) hours as needed for pain.   10 tablet   0     BP 104/59  Pulse 76  Temp 98 F (36.7 C)  Resp 12  SpO2 100%  LMP 07/09/2012  Breastfeeding? No  Physical Exam  Nursing note and vitals reviewed. Constitutional: She is oriented to person, place, and time. She appears well-developed and well-nourished.       Obese  HENT:  Head: Normocephalic and atraumatic.  Eyes: Conjunctivae normal and EOM are normal. Pupils are equal, round, and reactive to light.  Neck: Normal range of motion and phonation normal. Neck supple.  Cardiovascular: Normal rate, regular rhythm and intact distal pulses.   Pulmonary/Chest: Effort normal and breath sounds  normal. No respiratory distress. She has no wheezes. She exhibits tenderness (Diffuse chest wall, tenderness, mild).  Abdominal: Soft. She exhibits no distension. There is no tenderness. There is no guarding.  Musculoskeletal: Normal range of motion.       Right leg, mildly tender to palpation, diffusely, without deformity or limitation of active range of motion. No cervical, thoracic, or lumbar spine tenderness Mild right peri-lumbar vertebral muscle tenderness  Neurological: She is alert and oriented to person, place, and time. She has normal strength. She exhibits normal muscle tone.  Skin: Skin is warm and dry.  Psychiatric: She has a normal mood and affect. Her behavior is normal. Judgment and thought content normal.    ED Course  Procedures (including critical care time)     Date: 08/01/2012  Rate: 75  Rhythm: normal sinus rhythm  QRS Axis: normal  PR and QT Intervals: normal  ST/T Wave abnormalities: normal  PR and QRS Conduction Disutrbances:none  Narrative Interpretation:   Old EKG Reviewed: none available   Nursing notes, applicable records and vitals reviewed.  Radiologic Images/Reports reviewed.  Records reviewed indicate ongoing neck pain during and after her recent pregnancy. She went back to see the anesthesiologist about her epidural injection site. Several weeks after the procedure. She has a prolonged history of visits to medical providers for painful conditions.   Reevaluation:  11:55- she now admits that she is out of her pain medicine, and does not have a way to see a pain Dr.; because her primary care doctor can't get her in until the first of the year. She would like to see a new doctor. I explained her that we could not refer her to a pain doctor, that does require a primary care service.     Labs Reviewed  URINALYSIS, ROUTINE W REFLEX MICROSCOPIC - Abnormal; Notable for the following:    APPearance CLOUDY (*)     Specific Gravity, Urine 1.038 (*)      Leukocytes, UA SMALL (*)     All other components within normal limits  CBC WITH DIFFERENTIAL - Abnormal; Notable for the following:    RDW 17.4 (*)     All other components within normal limits  URINE MICROSCOPIC-ADD ON - Abnormal; Notable for the following:    Squamous Epithelial / LPF FEW (*)     All  other components within normal limits  BASIC METABOLIC PANEL  POCT PREGNANCY, URINE   Dg Chest 2 View  09/03/2012  *RADIOLOGY REPORT*  Clinical Data: Anterior chest pain radiating to the back. Shortness of breath.  CHEST - 2 VIEW  Comparison: 09/05/2011.  Findings: Trachea is midline.  Heart size normal.  Lungs are clear. No pleural fluid.  IMPRESSION: No acute findings.   Original Report Authenticated By: Leanna Battles, M.D.    Nursing notes, applicable records and vitals reviewed.  Radiologic Images/Reports reviewed.   1. Chronic pain       MDM  Nonspecific musculoskeletal pain. Clinically doubt coronary disease, pneumonia, venous thrombosis, or serious bacterial infection. Screening laboratory evaluation and imaging are negative. Likely exacerbation of chronic symptoms of pain.   Plan: Home Medications- Norco #10; Home Treatments- rest; Recommended follow up- PCP of choice to arrange ongoing care.       Flint Melter, MD 09/03/12 2016

## 2012-09-03 NOTE — ED Notes (Addendum)
Not able to give EKG to any of the doctors because Dr. Lorenso Courier & Dr. Effie Shy are in  Res A with a code.

## 2012-09-22 ENCOUNTER — Ambulatory Visit: Payer: Medicaid Other | Admitting: Obstetrics and Gynecology

## 2012-09-22 ENCOUNTER — Encounter: Payer: Self-pay | Admitting: Obstetrics and Gynecology

## 2012-10-10 ENCOUNTER — Emergency Department (HOSPITAL_COMMUNITY)
Admission: EM | Admit: 2012-10-10 | Discharge: 2012-10-11 | Disposition: A | Discharge status: Home / Self Care | Payer: Medicaid Other | Attending: Emergency Medicine | Admitting: Emergency Medicine

## 2012-10-10 ENCOUNTER — Encounter (HOSPITAL_COMMUNITY): Payer: Self-pay | Admitting: *Deleted

## 2012-10-10 ENCOUNTER — Emergency Department (HOSPITAL_COMMUNITY): Payer: Medicaid Other

## 2012-10-10 DIAGNOSIS — Z8659 Personal history of other mental and behavioral disorders: Secondary | ICD-10-CM | POA: Insufficient documentation

## 2012-10-10 DIAGNOSIS — Z79899 Other long term (current) drug therapy: Secondary | ICD-10-CM | POA: Insufficient documentation

## 2012-10-10 DIAGNOSIS — J45909 Unspecified asthma, uncomplicated: Secondary | ICD-10-CM | POA: Insufficient documentation

## 2012-10-10 DIAGNOSIS — K5900 Constipation, unspecified: Secondary | ICD-10-CM | POA: Insufficient documentation

## 2012-10-10 DIAGNOSIS — J111 Influenza due to unidentified influenza virus with other respiratory manifestations: Secondary | ICD-10-CM | POA: Insufficient documentation

## 2012-10-10 DIAGNOSIS — M545 Low back pain, unspecified: Secondary | ICD-10-CM | POA: Insufficient documentation

## 2012-10-10 DIAGNOSIS — E669 Obesity, unspecified: Secondary | ICD-10-CM | POA: Insufficient documentation

## 2012-10-10 DIAGNOSIS — Z87440 Personal history of urinary (tract) infections: Secondary | ICD-10-CM | POA: Insufficient documentation

## 2012-10-10 DIAGNOSIS — Z8619 Personal history of other infectious and parasitic diseases: Secondary | ICD-10-CM | POA: Insufficient documentation

## 2012-10-10 DIAGNOSIS — M549 Dorsalgia, unspecified: Secondary | ICD-10-CM

## 2012-10-10 DIAGNOSIS — Z8742 Personal history of other diseases of the female genital tract: Secondary | ICD-10-CM | POA: Insufficient documentation

## 2012-10-10 DIAGNOSIS — G43909 Migraine, unspecified, not intractable, without status migrainosus: Secondary | ICD-10-CM | POA: Insufficient documentation

## 2012-10-10 MED ORDER — IPRATROPIUM BROMIDE 0.02 % IN SOLN
0.5000 mg | Freq: Once | RESPIRATORY_TRACT | Status: AC
  Administered 2012-10-10: 0.5 mg via RESPIRATORY_TRACT
  Filled 2012-10-10: qty 2.5

## 2012-10-10 MED ORDER — ALBUTEROL SULFATE (5 MG/ML) 0.5% IN NEBU
5.0000 mg | INHALATION_SOLUTION | Freq: Once | RESPIRATORY_TRACT | Status: AC
  Administered 2012-10-10: 5 mg via RESPIRATORY_TRACT
  Filled 2012-10-10: qty 1

## 2012-10-10 MED ORDER — ALBUTEROL SULFATE (5 MG/ML) 0.5% IN NEBU
5.0000 mg | INHALATION_SOLUTION | Freq: Once | RESPIRATORY_TRACT | Status: AC
  Administered 2012-10-10: 5 mg via RESPIRATORY_TRACT
  Filled 2012-10-10: qty 40

## 2012-10-10 NOTE — ED Notes (Signed)
The pt has multiple complaints.  Headache sore throat  Aching all  Over her body and she has asthma with chest congestion and dizziness.  She has a hhn but she does not have all her equipment with the machine.  lmp injection

## 2012-10-10 NOTE — ED Notes (Signed)
Pt. States "I feel a lot better"

## 2012-10-10 NOTE — ED Provider Notes (Signed)
History     CSN: [id number]  Arrival date & time 10/10/12  1308   First MD Initiated Contact with Patient 10/10/12 2258      Chief Complaint  Patient presents with  . Flu-like symptoms     (Consider location/radiation/quality/duration/timing/severity/associated sxs/prior treatment) HPI This is a 21 year old female with history of asthma. She is here with three-day history of flulike symptoms. Specifically she complains of body aches, headache, cough, sore throat, lightheadedness, wheezing, chest tightness and shortness of breath. She had no mask for her home nebulizer machine. She was given a albuterol treatment on arrival here with partial relief of her symptoms and was provided with a new mask. She feels like she needs another breathing treatment. She does have albuterol at home, both for nebulization and inhaler. She's not sure she's had a fever. She denies vomiting or diarrhea but has had nausea. She is having low back pain, which is chronic but has not been able to get into a pain management physician.   Past Medical History  Diagnosis Date  . Asthma   . Chlamydia 2008  . Gonorrhea   . Irregular periods/menstrual cycles   . Constipation, chronic 11/13/11  . Migraines     Migraines  . Depression     postpartum  . Trichomonas 2008  . Hx: UTI (urinary tract infection)     Frequently  . Obese     Past Surgical History  Procedure Date  . Wisdom tooth extraction   . Induced abortion     Family History  Problem Relation Age of Onset  . Anesthesia problems Neg Hx   . Hypotension Neg Hx   . Malignant hyperthermia Neg Hx   . Pseudochol deficiency Neg Hx   . Hypertension Mother   . Heart disease Mother   . Diabetes Mother   . Stroke Mother   . Hyperlipidemia Mother   . Hypertension Sister   . Thrombophlebitis Sister     clotting issue  . Depression Sister   . Heart disease Maternal Grandfather   . Drug abuse Father   . Alcohol abuse Maternal Uncle   . Kidney  disease Maternal Uncle     failure  . Rheum arthritis Sister     arthritis vs fibromyalgia  . Heart murmur Sister   . Fibromyalgia Sister   . Depression Sister   . Hypertension Sister     History  Substance Use Topics  . Smoking status: Never Smoker   . Smokeless tobacco: Never Used  . Alcohol Use: Yes     Comment: occasional    OB History    Grav Para Term Preterm Abortions TAB SAB Ect Mult Living   3 2 2  0 1 1 0 0 0 2      Review of Systems  All other systems reviewed and are negative.    Allergies  Review of patient's allergies indicates no known allergies.  Home Medications   Current Outpatient Rx  Name  Route  Sig  Dispense  Refill  . ALBUTEROL SULFATE HFA 108 (90 BASE) MCG/ACT IN AERS   Inhalation   Inhale 2 puffs into the lungs every 4 (four) hours as needed. For shortness of breath/wheezing         . ALBUTEROL SULFATE (2.5 MG/3ML) 0.083% IN NEBU   Nebulization   Take 2.5 mg by nebulization every 6 (six) hours as needed. For shortness of breath         . CETIRIZINE HCL 10 MG PO TABS  Oral   Take 10 mg by mouth daily.         Marland Kitchen HYDROCODONE-ACETAMINOPHEN 5-325 MG PO TABS   Oral   Take 1 tablet by mouth every 4 (four) hours as needed for pain.   10 tablet   0   . IBUPROFEN 600 MG PO TABS   Oral   Take 1 tablet (600 mg total) by mouth every 6 (six) hours as needed for pain.   36 tablet   2   . PRENATAL MULTIVITAMIN CH   Oral   Take 1 tablet by mouth daily.         . NYQUIL PO   Oral   Take 30 mLs by mouth daily as needed. For symptoms           BP 103/72  Pulse 88  Temp 98.6 F (37 C) (Oral)  Resp 22  SpO2 98%  Physical Exam General: Well-developed, well-nourished female in no acute distress; appearance consistent with age of record HENT: normocephalic, atraumatic; no pharyngeal erythema or exudate Eyes: pupils equal round and reactive to light; extraocular muscles intact Neck: supple Heart: regular rate and  rhythm Lungs: Mostly clear with faint expiratory wheezing in the right base Abdomen: soft; nondistended; nontender; no masses or hepatosplenomegaly; bowel sounds present Extremities: No deformity; full range of motion Neurologic: Awake, alert and oriented; motor function intact in all extremities and symmetric; no facial droop Skin: Warm and dry Psychiatric: Normal mood and affect    ED Course  Procedures (including critical care time)    MDM  Nursing notes and vitals signs, including pulse oximetry, reviewed.  Imaging Studies: Dg Chest 2 View  10/10/2012  *RADIOLOGY REPORT*  Clinical Data: Chest congestion.  Cough and wheezing.  SOB  CHEST - 2 VIEW  Comparison: 09/03/2012  Findings: Normal heart size.  No pleural effusion or edema.  No airspace consolidation identified.  Visualized osseous structures are unremarkable.  IMPRESSION:  1. No active cardiopulmonary abnormalities.   Original Report Authenticated By: Signa Kell, M.D.    12:16 AM Patient feels back to normal after a third neb treatment.         Hanley Seamen, MD 10/11/12 [phone]

## 2012-10-10 NOTE — ED Notes (Signed)
Pt. C/o multiple symptoms. Reports SOB, back pain, aches, chills, and cough x3 days. Reports coughing up "blood and mucus". No resp distress noted. Inspiratory wheezing bilaterally.

## 2012-10-11 MED ORDER — HYDROCODONE-ACETAMINOPHEN 5-325 MG PO TABS: 1.0000 | ORAL_TABLET | Freq: Four times a day (QID) | ORAL | Status: DC | PRN

## 2012-10-11 MED ORDER — IBUPROFEN 600 MG PO TABS: 600.0000 mg | ORAL_TABLET | Freq: Four times a day (QID) | ORAL | Status: DC | PRN

## 2012-11-04 ENCOUNTER — Telehealth: Payer: Self-pay | Admitting: Obstetrics and Gynecology

## 2012-11-04 NOTE — Telephone Encounter (Signed)
Tc to pt regarding msg.  Pt wanted to know if she had been tested for [facility] trait before, her daughter has the trait and so does FOB.  Pt informed this office performed a [place] screen on her and it was negative.  Pt voices agreement.

## 2012-11-06 ENCOUNTER — Emergency Department (HOSPITAL_COMMUNITY): Payer: Medicaid Other

## 2012-11-06 ENCOUNTER — Emergency Department (HOSPITAL_COMMUNITY)
Admission: EM | Admit: 2012-11-06 | Discharge: 2012-11-06 | Disposition: A | Discharge status: Home / Self Care | Payer: Medicaid Other | Attending: Emergency Medicine | Admitting: Emergency Medicine

## 2012-11-06 ENCOUNTER — Encounter (HOSPITAL_COMMUNITY): Payer: Self-pay | Admitting: *Deleted

## 2012-11-06 DIAGNOSIS — R0602 Shortness of breath: Secondary | ICD-10-CM | POA: Insufficient documentation

## 2012-11-06 DIAGNOSIS — R112 Nausea with vomiting, unspecified: Secondary | ICD-10-CM | POA: Insufficient documentation

## 2012-11-06 DIAGNOSIS — IMO0001 Reserved for inherently not codable concepts without codable children: Secondary | ICD-10-CM | POA: Insufficient documentation

## 2012-11-06 DIAGNOSIS — R5381 Other malaise: Secondary | ICD-10-CM | POA: Insufficient documentation

## 2012-11-06 DIAGNOSIS — Z3202 Encounter for pregnancy test, result negative: Secondary | ICD-10-CM | POA: Insufficient documentation

## 2012-11-06 DIAGNOSIS — R509 Fever, unspecified: Secondary | ICD-10-CM | POA: Insufficient documentation

## 2012-11-06 DIAGNOSIS — Z87440 Personal history of urinary (tract) infections: Secondary | ICD-10-CM | POA: Insufficient documentation

## 2012-11-06 DIAGNOSIS — E669 Obesity, unspecified: Secondary | ICD-10-CM | POA: Insufficient documentation

## 2012-11-06 DIAGNOSIS — J45909 Unspecified asthma, uncomplicated: Secondary | ICD-10-CM | POA: Insufficient documentation

## 2012-11-06 DIAGNOSIS — R42 Dizziness and giddiness: Secondary | ICD-10-CM | POA: Insufficient documentation

## 2012-11-06 DIAGNOSIS — J029 Acute pharyngitis, unspecified: Secondary | ICD-10-CM | POA: Insufficient documentation

## 2012-11-06 DIAGNOSIS — R059 Cough, unspecified: Secondary | ICD-10-CM | POA: Insufficient documentation

## 2012-11-06 DIAGNOSIS — R6889 Other general symptoms and signs: Secondary | ICD-10-CM

## 2012-11-06 DIAGNOSIS — R51 Headache: Secondary | ICD-10-CM | POA: Insufficient documentation

## 2012-11-06 DIAGNOSIS — Z8619 Personal history of other infectious and parasitic diseases: Secondary | ICD-10-CM | POA: Insufficient documentation

## 2012-11-06 DIAGNOSIS — Z8742 Personal history of other diseases of the female genital tract: Secondary | ICD-10-CM | POA: Insufficient documentation

## 2012-11-06 DIAGNOSIS — Z8719 Personal history of other diseases of the digestive system: Secondary | ICD-10-CM | POA: Insufficient documentation

## 2012-11-06 DIAGNOSIS — R05 Cough: Secondary | ICD-10-CM | POA: Insufficient documentation

## 2012-11-06 DIAGNOSIS — Z8659 Personal history of other mental and behavioral disorders: Secondary | ICD-10-CM | POA: Insufficient documentation

## 2012-11-06 DIAGNOSIS — Z79899 Other long term (current) drug therapy: Secondary | ICD-10-CM | POA: Insufficient documentation

## 2012-11-06 MED ORDER — OSELTAMIVIR PHOSPHATE 75 MG PO CAPS: 75.0000 mg | ORAL_CAPSULE | Freq: Two times a day (BID) | ORAL | Status: DC

## 2012-11-06 MED ORDER — ONDANSETRON HCL 4 MG/2ML IJ SOLN: 4.0000 mg | Freq: Once | INTRAMUSCULAR | Status: DC

## 2012-11-06 NOTE — ED Provider Notes (Signed)
Medical screening examination/treatment/procedure(s) were performed by non-physician practitioner and as supervising physician I was immediately available for consultation/collaboration.   Laquinta Hazell H Romon Devereux, MD 11/06/12 2339 

## 2012-11-06 NOTE — ED Provider Notes (Signed)
History   This chart was scribed for non-physician practitioner working with Richardean Canal, MD by Gerlean Ren, ED Scribe. This patient was seen in room TR07C/TR07C and the patient's care was started at 6:09 PM.    CSN: [id number]  Arrival date & time 11/06/12  1347   First MD Initiated Contact with Patient 11/06/12 1806      Chief Complaint  Patient presents with  . Shortness of Breath  . Generalized Body Aches     The history is provided by the patient. No language interpreter was used.   Margaret Cline is a 22 y.o. female with h/o asthma who presents to the Emergency Department complaining of one week of flu-like myalgias with HA, fever, chills, cough, sore throat, dizziness, increased sleeping, nausea, and one episode of non-bloody, non-bilious emesis today.  Pt also c/o dyspnea normal to difficulty with asthma. Patient states her discomfort is moderate.  Pt has used ibuprofen with no relief.  Pt seen here one month ago with similar symptoms and treated with breathing treatment.  Pt denies tobacco use but reports occasional alcohol use.   Past Medical History  Diagnosis Date  . Asthma   . Chlamydia 2008  . Gonorrhea   . Irregular periods/menstrual cycles   . Constipation, chronic 11/13/11  . Migraines     Migraines  . Depression     postpartum  . Trichomonas 2008  . Hx: UTI (urinary tract infection)     Frequently  . Obese     Past Surgical History  Procedure Date  . Wisdom tooth extraction   . Induced abortion     Family History  Problem Relation Age of Onset  . Anesthesia problems Neg Hx   . Hypotension Neg Hx   . Malignant hyperthermia Neg Hx   . Pseudochol deficiency Neg Hx   . Hypertension Mother   . Heart disease Mother   . Diabetes Mother   . Stroke Mother   . Hyperlipidemia Mother   . Hypertension Sister   . Thrombophlebitis Sister     clotting issue  . Depression Sister   . Heart disease Maternal Grandfather   . Drug abuse Father   . Alcohol abuse  Maternal Uncle   . Kidney disease Maternal Uncle     failure  . Rheum arthritis Sister     arthritis vs fibromyalgia  . Heart murmur Sister   . Fibromyalgia Sister   . Depression Sister   . Hypertension Sister     History  Substance Use Topics  . Smoking status: Never Smoker   . Smokeless tobacco: Never Used  . Alcohol Use: Yes     Comment: occasional    OB History    Grav Para Term Preterm Abortions TAB SAB Ect Mult Living   3 2 2  0 1 1 0 0 0 2      Review of Systems  Constitutional: Positive for fever and chills.  HENT: Positive for sore throat.   Respiratory: Positive for cough and shortness of breath.   Gastrointestinal: Positive for nausea and vomiting.  Musculoskeletal: Positive for myalgias.  Neurological: Positive for dizziness and headaches.    Allergies  Review of patient's allergies indicates no known allergies.  Home Medications   Current Outpatient Rx  Name  Route  Sig  Dispense  Refill  . ALBUTEROL SULFATE HFA 108 (90 BASE) MCG/ACT IN AERS   Inhalation   Inhale 2 puffs into the lungs every 4 (four) hours as needed. For shortness  of breath/wheezing         . ALBUTEROL SULFATE (2.5 MG/3ML) 0.083% IN NEBU   Nebulization   Take 2.5 mg by nebulization every 6 (six) hours as needed. For shortness of breath         . CETIRIZINE HCL 10 MG PO TABS   Oral   Take 10 mg by mouth daily.         . IBUPROFEN 600 MG PO TABS   Oral   Take 600 mg by mouth every 6 (six) hours as needed. For pain         . PRENATAL MULTIVITAMIN CH   Oral   Take 1 tablet by mouth daily.           BP 122/80  Pulse 79  Temp 98.5 F (36.9 C) (Oral)  SpO2 100%  Physical Exam  Nursing note and vitals reviewed. Constitutional: She is oriented to person, place, and time. She appears well-developed and well-nourished. No distress.  HENT:  Head: Normocephalic and atraumatic.  Eyes: EOM are normal.  Neck: Neck supple.  Cardiovascular: Normal rate, regular rhythm  and normal heart sounds.  Exam reveals no gallop and no friction rub.   No murmur heard. Pulmonary/Chest: Effort normal and breath sounds normal. No respiratory distress. She has no wheezes.  Musculoskeletal: Normal range of motion.  Neurological: She is alert and oriented to person, place, and time.  Skin: Skin is warm and dry.  Psychiatric: She has a normal mood and affect. Her behavior is normal.    ED Course  Procedures (including critical care time) DIAGNOSTIC STUDIES: Oxygen Saturation is 100% on room air, normal by my interpretation.    COORDINATION OF CARE: 6:15 PM- Patient informed of clinical course including chest XR, understands medical decision-making process, and agrees with plan.   Results for orders placed during the hospital encounter of 09/03/12  URINALYSIS, ROUTINE W REFLEX MICROSCOPIC      Component Value Range   Color, Urine YELLOW  YELLOW   APPearance CLOUDY (*) CLEAR   Specific Gravity, Urine 1.038 (*) 1.005 - 1.030   pH 6.0  5.0 - 8.0   Glucose, UA NEGATIVE  NEGATIVE mg/dL   Hgb urine dipstick NEGATIVE  NEGATIVE   Bilirubin Urine NEGATIVE  NEGATIVE   Ketones, ur NEGATIVE  NEGATIVE mg/dL   Protein, ur NEGATIVE  NEGATIVE mg/dL   Urobilinogen, UA 0.2  0.0 - 1.0 mg/dL   Nitrite NEGATIVE  NEGATIVE   Leukocytes, UA SMALL (*) NEGATIVE  BASIC METABOLIC PANEL      Component Value Range   Sodium 139  135 - 145 mEq/L   Potassium 3.7  3.5 - 5.1 mEq/L   Chloride 103  96 - 112 mEq/L   CO2 25  19 - 32 mEq/L   Glucose, Bld 82  70 - 99 mg/dL   BUN 13  6 - 23 mg/dL   Creatinine, Ser 8.29  0.50 - 1.10 mg/dL   Calcium 9.7  8.4 - 56.2 mg/dL   GFR calc non Af Amer >90  >90 mL/min   GFR calc Af Amer >90  >90 mL/min  CBC WITH DIFFERENTIAL      Component Value Range   WBC 9.1  4.0 - 10.5 K/uL   RBC 5.06  3.87 - 5.11 MIL/uL   Hemoglobin 13.5  12.0 - 15.0 g/dL   HCT 13.0  86.5 - 78.4 %   MCV 80.6  78.0 - 100.0 fL   MCH 26.7  26.0 -  34.0 pg   MCHC 33.1  30.0 - 36.0  g/dL   RDW 19.1 (*) 47.8 - 29.5 %   Platelets 318  150 - 400 K/uL   Neutrophils Relative 61  43 - 77 %   Neutro Abs 5.6  1.7 - 7.7 K/uL   Lymphocytes Relative 34  12 - 46 %   Lymphs Abs 3.1  0.7 - 4.0 K/uL   Monocytes Relative 4  3 - 12 %   Monocytes Absolute 0.3  0.1 - 1.0 K/uL   Eosinophils Relative 1  0 - 5 %   Eosinophils Absolute 0.1  0.0 - 0.7 K/uL   Basophils Relative 0  0 - 1 %   Basophils Absolute 0.0  0.0 - 0.1 K/uL  POCT PREGNANCY, URINE      Component Value Range   Preg Test, Ur NEGATIVE  NEGATIVE  URINE MICROSCOPIC-ADD ON      Component Value Range   Squamous Epithelial / LPF FEW (*) RARE   WBC, UA 3-6  <3 WBC/hpf   RBC / HPF 0-2  <3 RBC/hpf   Dg Chest 2 View  11/06/2012  *RADIOLOGY REPORT*  Clinical Data: Chest pain and congestion.  Myalgias.  CHEST - 2 VIEW  Comparison: 10/10/2012.  Findings: Normal sized heart.  Clear lungs with normal vascularity. Normal appearing bones.  IMPRESSION: Normal examination, unchanged.   Original Report Authenticated By: Beckie Salts, M.D.         1. Flu-like symptoms       MDM  This is a 22 year old female, who presents emergency Department with flulike symptoms. Patient states that she's been feeling sick for the past 3 weeks, however she states that she is improved, and then got sick in a couple days ago. She never received any treatment for her symptoms, and requests treatment now. Doubt pneumonia, as is chest x-ray was clear. Doubt sinusitis, as no tenderness to palpation of sinuses. Will treat the patient for flulike illness with Tamiflu. Patient encouraged to use OTC cough and cold medicine. Patient understands and agrees with the plan. She is stable and ready for discharge.  I personally performed the services described in this documentation, which was scribed in my presence. The recorded information has been reviewed and is accurate.          Roxy Horseman, PA-C 11/06/12 1858

## 2012-11-06 NOTE — ED Notes (Addendum)
To ED for eval of general weakness, aches, asthma attacks for the past cple weeks. States she was seen for same a cple days ago and told she may have the start of the flu but no test done. Appears in nad. Masked at triage due to complaint. resp e/u. Speaks in full clear sentences. Lung sounds clear. States she has had drainage from nose and sinus pain

## 2012-12-17 ENCOUNTER — Emergency Department (HOSPITAL_COMMUNITY)
Admission: EM | Admit: 2012-12-17 | Discharge: 2012-12-17 | Disposition: A | Discharge status: Home / Self Care | Payer: Medicaid Other | Attending: Emergency Medicine | Admitting: Emergency Medicine

## 2012-12-17 ENCOUNTER — Encounter (HOSPITAL_COMMUNITY): Payer: Self-pay | Admitting: Emergency Medicine

## 2012-12-17 ENCOUNTER — Emergency Department (HOSPITAL_COMMUNITY): Payer: Medicaid Other

## 2012-12-17 DIAGNOSIS — Z8659 Personal history of other mental and behavioral disorders: Secondary | ICD-10-CM | POA: Insufficient documentation

## 2012-12-17 DIAGNOSIS — S6390XA Sprain of unspecified part of unspecified wrist and hand, initial encounter: Secondary | ICD-10-CM | POA: Insufficient documentation

## 2012-12-17 DIAGNOSIS — Z8619 Personal history of other infectious and parasitic diseases: Secondary | ICD-10-CM | POA: Insufficient documentation

## 2012-12-17 DIAGNOSIS — Y929 Unspecified place or not applicable: Secondary | ICD-10-CM | POA: Insufficient documentation

## 2012-12-17 DIAGNOSIS — J45909 Unspecified asthma, uncomplicated: Secondary | ICD-10-CM | POA: Insufficient documentation

## 2012-12-17 DIAGNOSIS — W010XXA Fall on same level from slipping, tripping and stumbling without subsequent striking against object, initial encounter: Secondary | ICD-10-CM | POA: Insufficient documentation

## 2012-12-17 DIAGNOSIS — E669 Obesity, unspecified: Secondary | ICD-10-CM | POA: Insufficient documentation

## 2012-12-17 DIAGNOSIS — Y9301 Activity, walking, marching and hiking: Secondary | ICD-10-CM | POA: Insufficient documentation

## 2012-12-17 DIAGNOSIS — K5900 Constipation, unspecified: Secondary | ICD-10-CM | POA: Insufficient documentation

## 2012-12-17 DIAGNOSIS — Z8742 Personal history of other diseases of the female genital tract: Secondary | ICD-10-CM | POA: Insufficient documentation

## 2012-12-17 DIAGNOSIS — Z8679 Personal history of other diseases of the circulatory system: Secondary | ICD-10-CM | POA: Insufficient documentation

## 2012-12-17 DIAGNOSIS — Z79899 Other long term (current) drug therapy: Secondary | ICD-10-CM | POA: Insufficient documentation

## 2012-12-17 DIAGNOSIS — Z87440 Personal history of urinary (tract) infections: Secondary | ICD-10-CM | POA: Insufficient documentation

## 2012-12-17 MED ORDER — HYDROCODONE-ACETAMINOPHEN 5-325 MG PO TABS: 1.0000 | ORAL_TABLET | ORAL | Status: DC | PRN

## 2012-12-17 NOTE — ED Provider Notes (Signed)
History     CSN: [id number]  Arrival date & time 12/17/12  0734   First MD Initiated Contact with Patient 12/17/12 [phone]      Chief Complaint  Patient presents with  . Fall    (Consider location/radiation/quality/duration/timing/severity/associated sxs/prior treatment) HPI Comments: Patient presents for left hand pain x 2 days after slipping on a shoe on Monday when she walked outside. Patient states she slipped and put her hand out behind her to break the fall and has been experiencing an intermittent sharp pain primarily on the dorsal aspect of her L hand proximal to her 2nd MCP joint. Patient has been trying rest, ice, ibuprofen and elevation with mild relief; aggravated with movement and palpation. Patient denies numbness and tingling in her LUE.  Patient is a 22 y.o. female presenting with fall. The history is provided by the patient. No language interpreter was used.  Fall The accident occurred 2 days ago. The fall occurred while walking. Point of impact: left hand/wrist. Pain location: L hand. The pain is mild. Pertinent negatives include no fever, no numbness, no nausea, no vomiting and no loss of consciousness. She has tried ice, NSAIDs, rest and elevation for the symptoms. The treatment provided mild relief.    Past Medical History  Diagnosis Date  . Asthma   . Chlamydia 2008  . Gonorrhea   . Irregular periods/menstrual cycles   . Constipation, chronic 11/13/11  . Migraines     Migraines  . Depression     postpartum  . Trichomonas 2008  . Hx: UTI (urinary tract infection)     Frequently  . Obese     Past Surgical History  Procedure Laterality Date  . Wisdom tooth extraction    . Induced abortion      Family History  Problem Relation Age of Onset  . Anesthesia problems Neg Hx   . Hypotension Neg Hx   . Malignant hyperthermia Neg Hx   . Pseudochol deficiency Neg Hx   . Hypertension Mother   . Heart disease Mother   . Diabetes Mother   . Stroke Mother   .  Hyperlipidemia Mother   . Hypertension Sister   . Thrombophlebitis Sister     clotting issue  . Depression Sister   . Heart disease Maternal Grandfather   . Drug abuse Father   . Alcohol abuse Maternal Uncle   . Kidney disease Maternal Uncle     failure  . Rheum arthritis Sister     arthritis vs fibromyalgia  . Heart murmur Sister   . Fibromyalgia Sister   . Depression Sister   . Hypertension Sister     History  Substance Use Topics  . Smoking status: Never Smoker   . Smokeless tobacco: Never Used  . Alcohol Use: No     Comment: occasional    OB History   Grav Para Term Preterm Abortions TAB SAB Ect Mult Living   3 2 2  0 1 1 0 0 0 2      Review of Systems  Constitutional: Negative for fever.  Gastrointestinal: Negative for nausea and vomiting.  Musculoskeletal: Positive for joint swelling.  Skin: Negative for color change.  Neurological: Negative for loss of consciousness, weakness and numbness.  All other systems reviewed and are negative.    Allergies  Review of patient's allergies indicates no known allergies.  Home Medications   Current Outpatient Rx  Name  Route  Sig  Dispense  Refill  . ibuprofen (ADVIL,MOTRIN) 600 MG tablet  Oral   Take 600 mg by mouth every 6 (six) hours as needed for pain. For pain         . Prenatal Vit-Fe Fumarate-FA (PRENATAL MULTIVITAMIN) TABS   Oral   Take 1 tablet by mouth daily.         Marland Kitchen albuterol (PROAIR HFA) 108 (90 BASE) MCG/ACT inhaler   Inhalation   Inhale 2 puffs into the lungs every 4 (four) hours as needed for wheezing. For shortness of breath/wheezing         . albuterol (PROVENTIL) (2.5 MG/3ML) 0.083% nebulizer solution   Nebulization   Take 2.5 mg by nebulization every 6 (six) hours as needed for wheezing. For shortness of breath         . cetirizine (ZYRTEC) 10 MG tablet   Oral   Take 10 mg by mouth daily.         Marland Kitchen HYDROcodone-acetaminophen (NORCO/VICODIN) 5-325 MG per tablet   Oral   Take  1 tablet by mouth every 4 (four) hours as needed for pain.   4 tablet   0     BP 116/68  Pulse 87  Temp(Src) 98.5 F (36.9 C) (Oral)  Resp 18  Ht 5\' 7"  (1.702 m)  Wt 228 lb (103.42 kg)  BMI 35.7 kg/m2  SpO2 100%  Physical Exam  Nursing note and vitals reviewed. Constitutional: She is oriented to person, place, and time. She appears well-developed and well-nourished. No distress.  HENT:  Head: Normocephalic and atraumatic.  Eyes: Conjunctivae are normal. No scleral icterus.  Neck: Normal range of motion.  Cardiovascular: Intact distal pulses.   Musculoskeletal: She exhibits tenderness. She exhibits no edema.       Left wrist: She exhibits normal range of motion, no bony tenderness, no swelling, no deformity and no laceration.       Left hand: She exhibits tenderness. She exhibits normal range of motion, normal capillary refill, no deformity and no laceration. Normal sensation noted. Normal strength noted.       Hands: Tenderness on palpation where indicated. Minimal swelling. No erythema, warmth, ecchymosis, deformity, or abrasion. 5/5 wrist strength against resistance. No tenderness on palpation of MCP, PIP, and DIP joints.  Neurological: She is alert and oriented to person, place, and time. She has normal strength. No sensory deficit.  Skin: Skin is warm and dry. No rash noted. She is not diaphoretic. No erythema. No pallor.  Psychiatric: She has a normal mood and affect. Her behavior is normal.    ED Course  Procedures (including critical care time)  Labs Reviewed - No data to display Dg Hand Complete Left  12/17/2012  *RADIOLOGY REPORT*  Clinical Data: Hand pain after fall.  LEFT HAND - COMPLETE 3+ VIEW  Comparison: None.  Findings:  No fracture or dislocation is noted.  Joint spaces are intact. No soft tissue abnormality is noted.  IMPRESSION: Normal left hand.   Original Report Authenticated By: Lupita Raider.,  M.D.      1. Hand strain, left, initial encounter       MDM  Patient neurovascularly intact with no evidence of fx or dislocation on xray; she is well appearing and nontoxic with stable vital signs. Patient with uncomplicated L hand strain stable for d/c with RICE instruction and Vicodin to take as needed for pain. Patient states comfort and understanding with this plan. Instructed to follow up with her PCP.        Antony Madura, PA-C 12/17/12 1539

## 2012-12-17 NOTE — ED Notes (Signed)
Patient claims she fell on Monday and injured her L hand and wrist.  Patient advised she slipped on a shoe on Monday and braced herself to break the fall with the L hand.

## 2012-12-17 NOTE — Discharge Instructions (Signed)
Do not drive or drink alcohol when taking Vicodin. Continue ibuprofen to help inflammation.   RICE: Routine Care for Injuries The routine care of many injuries includes Rest, Ice, Compression, and Elevation (RICE). HOME CARE INSTRUCTIONS  Rest is needed to allow your body to heal. Routine activities can usually be resumed when comfortable. Injured tendons and bones can take up to 6 weeks to heal. Tendons are the cord-like structures that attach muscle to bone.  Ice following an injury helps keep the swelling down and reduces pain.  Put ice in a plastic bag.  Place a towel between your skin and the bag.  Leave the ice on for 15 to 20 minutes, 3 to 4 times a day. Do this while awake, for the first 24 to 48 hours. After that, continue as directed by your caregiver.  Compression helps keep swelling down. It also gives support and helps with discomfort. If an elastic bandage has been applied, it should be removed and reapplied every 3 to 4 hours. It should not be applied tightly, but firmly enough to keep swelling down. Watch fingers or toes for swelling, bluish discoloration, coldness, numbness, or excessive pain. If any of these problems occur, remove the bandage and reapply loosely. Contact your caregiver if these problems continue.  Elevation helps reduce swelling and decreases pain. With extremities, such as the arms, hands, legs, and feet, the injured area should be placed near or above the level of the heart, if possible. SEEK IMMEDIATE MEDICAL CARE IF:  You have persistent pain and swelling.  You develop redness, numbness, or unexpected weakness.  Your symptoms are getting worse rather than improving after several days. These symptoms may indicate that further evaluation or further X-rays are needed. Sometimes, X-rays may not show a small broken bone (fracture) until 1 week or 10 days later. Make a follow-up appointment with your caregiver. Ask when your X-ray results will be ready. Make  sure you get your X-ray results. Document Released: 01/13/2001 Document Revised: 12/24/2011 Document Reviewed: 03/02/2011 [facility] Patient Information 2013 [place], [place].

## 2012-12-22 NOTE — ED Provider Notes (Signed)
Medical screening examination/treatment/procedure(s) were performed by non-physician practitioner and as supervising physician I was immediately available for consultation/collaboration.   Jayin Derousse E Khristin Keleher, MD 12/22/12 0739 

## 2013-02-18 ENCOUNTER — Other Ambulatory Visit: Payer: Self-pay | Admitting: Family Medicine

## 2013-02-18 ENCOUNTER — Ambulatory Visit
Admission: RE | Admit: 2013-02-18 | Discharge: 2013-02-18 | Disposition: A | Discharge status: Home / Self Care | Payer: Medicaid Other | Source: Ambulatory Visit | Attending: Family Medicine | Admitting: Family Medicine

## 2013-02-18 DIAGNOSIS — M545 Low back pain: Secondary | ICD-10-CM

## 2013-03-29 ENCOUNTER — Emergency Department (HOSPITAL_COMMUNITY): Payer: No Typology Code available for payment source

## 2013-03-29 ENCOUNTER — Emergency Department (HOSPITAL_COMMUNITY)
Admission: EM | Admit: 2013-03-29 | Discharge: 2013-03-29 | Disposition: A | Discharge status: Home / Self Care | Payer: No Typology Code available for payment source | Attending: Emergency Medicine | Admitting: Emergency Medicine

## 2013-03-29 ENCOUNTER — Encounter (HOSPITAL_COMMUNITY): Payer: Self-pay

## 2013-03-29 DIAGNOSIS — Y92410 Unspecified street and highway as the place of occurrence of the external cause: Secondary | ICD-10-CM | POA: Insufficient documentation

## 2013-03-29 DIAGNOSIS — H539 Unspecified visual disturbance: Secondary | ICD-10-CM | POA: Insufficient documentation

## 2013-03-29 DIAGNOSIS — Z8619 Personal history of other infectious and parasitic diseases: Secondary | ICD-10-CM | POA: Insufficient documentation

## 2013-03-29 DIAGNOSIS — Z79899 Other long term (current) drug therapy: Secondary | ICD-10-CM | POA: Insufficient documentation

## 2013-03-29 DIAGNOSIS — Z791 Long term (current) use of non-steroidal anti-inflammatories (NSAID): Secondary | ICD-10-CM | POA: Insufficient documentation

## 2013-03-29 DIAGNOSIS — J329 Chronic sinusitis, unspecified: Secondary | ICD-10-CM

## 2013-03-29 DIAGNOSIS — S0990XA Unspecified injury of head, initial encounter: Secondary | ICD-10-CM | POA: Insufficient documentation

## 2013-03-29 DIAGNOSIS — F329 Major depressive disorder, single episode, unspecified: Secondary | ICD-10-CM | POA: Insufficient documentation

## 2013-03-29 DIAGNOSIS — E669 Obesity, unspecified: Secondary | ICD-10-CM | POA: Insufficient documentation

## 2013-03-29 DIAGNOSIS — J45909 Unspecified asthma, uncomplicated: Secondary | ICD-10-CM | POA: Insufficient documentation

## 2013-03-29 DIAGNOSIS — IMO0002 Reserved for concepts with insufficient information to code with codable children: Secondary | ICD-10-CM | POA: Insufficient documentation

## 2013-03-29 DIAGNOSIS — Z8679 Personal history of other diseases of the circulatory system: Secondary | ICD-10-CM | POA: Insufficient documentation

## 2013-03-29 DIAGNOSIS — Z3202 Encounter for pregnancy test, result negative: Secondary | ICD-10-CM | POA: Insufficient documentation

## 2013-03-29 DIAGNOSIS — Z8742 Personal history of other diseases of the female genital tract: Secondary | ICD-10-CM | POA: Insufficient documentation

## 2013-03-29 DIAGNOSIS — F3289 Other specified depressive episodes: Secondary | ICD-10-CM | POA: Insufficient documentation

## 2013-03-29 DIAGNOSIS — Y9389 Activity, other specified: Secondary | ICD-10-CM | POA: Insufficient documentation

## 2013-03-29 DIAGNOSIS — K5900 Constipation, unspecified: Secondary | ICD-10-CM | POA: Insufficient documentation

## 2013-03-29 DIAGNOSIS — Z87440 Personal history of urinary (tract) infections: Secondary | ICD-10-CM | POA: Insufficient documentation

## 2013-03-29 MED ORDER — HYDROCODONE-ACETAMINOPHEN 5-325 MG PO TABS
1.0000 | ORAL_TABLET | ORAL | Status: DC | PRN
Start: 2013-03-29 — End: 2013-06-26

## 2013-03-29 MED ORDER — DOXYCYCLINE HYCLATE 100 MG PO CAPS: 100.0000 mg | ORAL_CAPSULE | Freq: Two times a day (BID) | ORAL | Status: DC

## 2013-03-29 MED ORDER — IBUPROFEN 600 MG PO TABS: 600.0000 mg | ORAL_TABLET | Freq: Four times a day (QID) | ORAL | Status: DC | PRN

## 2013-03-29 NOTE — ED Provider Notes (Signed)
Medical screening examination/treatment/procedure(s) were performed by non-physician practitioner and as supervising physician I was immediately available for consultation/collaboration.   Gwyneth Sprout, MD 03/29/13 [phone]
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
## 2013-06-26 ENCOUNTER — Emergency Department (HOSPITAL_COMMUNITY)
Admission: EM | Admit: 2013-06-26 | Discharge: 2013-06-26 | Disposition: A | Discharge status: Home / Self Care | Payer: Medicaid Other | Attending: Emergency Medicine | Admitting: Emergency Medicine

## 2013-06-26 ENCOUNTER — Emergency Department (HOSPITAL_COMMUNITY): Payer: Medicaid Other

## 2013-06-26 ENCOUNTER — Encounter (HOSPITAL_COMMUNITY): Payer: Self-pay | Admitting: Cardiology

## 2013-06-26 DIAGNOSIS — J45909 Unspecified asthma, uncomplicated: Secondary | ICD-10-CM | POA: Insufficient documentation

## 2013-06-26 DIAGNOSIS — Y9239 Other specified sports and athletic area as the place of occurrence of the external cause: Secondary | ICD-10-CM | POA: Insufficient documentation

## 2013-06-26 DIAGNOSIS — E669 Obesity, unspecified: Secondary | ICD-10-CM | POA: Insufficient documentation

## 2013-06-26 DIAGNOSIS — Z8659 Personal history of other mental and behavioral disorders: Secondary | ICD-10-CM | POA: Insufficient documentation

## 2013-06-26 DIAGNOSIS — X500XXA Overexertion from strenuous movement or load, initial encounter: Secondary | ICD-10-CM | POA: Insufficient documentation

## 2013-06-26 DIAGNOSIS — Z87440 Personal history of urinary (tract) infections: Secondary | ICD-10-CM | POA: Insufficient documentation

## 2013-06-26 DIAGNOSIS — S93401A Sprain of unspecified ligament of right ankle, initial encounter: Secondary | ICD-10-CM

## 2013-06-26 DIAGNOSIS — Y939 Activity, unspecified: Secondary | ICD-10-CM | POA: Insufficient documentation

## 2013-06-26 DIAGNOSIS — Z8719 Personal history of other diseases of the digestive system: Secondary | ICD-10-CM | POA: Insufficient documentation

## 2013-06-26 DIAGNOSIS — Z8619 Personal history of other infectious and parasitic diseases: Secondary | ICD-10-CM | POA: Insufficient documentation

## 2013-06-26 DIAGNOSIS — S93409A Sprain of unspecified ligament of unspecified ankle, initial encounter: Secondary | ICD-10-CM | POA: Insufficient documentation

## 2013-06-26 DIAGNOSIS — Z8742 Personal history of other diseases of the female genital tract: Secondary | ICD-10-CM | POA: Insufficient documentation

## 2013-06-26 DIAGNOSIS — Z79899 Other long term (current) drug therapy: Secondary | ICD-10-CM | POA: Insufficient documentation

## 2013-06-26 DIAGNOSIS — Z8669 Personal history of other diseases of the nervous system and sense organs: Secondary | ICD-10-CM | POA: Insufficient documentation

## 2013-06-26 MED ORDER — NAPROXEN 500 MG PO TABS: 500.0000 mg | ORAL_TABLET | Freq: Two times a day (BID) | ORAL | Status: DC

## 2013-06-26 MED ORDER — HYDROCODONE-ACETAMINOPHEN 5-325 MG PO TABS: 1.0000 | ORAL_TABLET | Freq: Four times a day (QID) | ORAL | Status: DC | PRN

## 2013-06-26 NOTE — ED Provider Notes (Signed)
CSN: [id number]     Arrival date & time 06/26/13  1730 History  This chart was scribed for non-physician practitioner, Felicie Morn, NP, working with Dagmar Hait, MD by Shari Heritage, ED Scribe. This patient was seen in room TR09C/TR09C and the patient's care was started at 7:13 PM.     Chief Complaint  Patient presents with  . Ankle Pain    Patient is a 22 y.o. female presenting with ankle pain. The history is provided by the patient. No language interpreter was used.  Ankle Pain Location:  Ankle Time since incident:  2 days Injury: yes   Mechanism of injury comment:  Eversion Ankle location:  R ankle Pain details:    Radiates to:  Does not radiate   Severity:  Moderate   Timing:  Constant Chronicity:  New Dislocation: no   Foreign body present:  No foreign bodies Ineffective treatments:  NSAIDs Associated symptoms: swelling   Associated symptoms: no back pain, no decreased ROM, no fever, no muscle weakness and no tingling      HPI Comments: Margaret Cline is a 22 y.o. female who presents to the Emergency Department complaining of moderate, constant right ankle pain onset yesterday. There is associated swelling. She states that she [place] the park. She reports that she has applied ice and elevated the ankle, but she did not apply compression. She is ambulatory, but pain is worse with walking. She has taken ibuprofen with no relief. She has no history of fracture or bony injury. She denies any other symptoms at this time. She hs a medical history of migraines. She does not smoke.    Past Medical History  Diagnosis Date  . Asthma   . Chlamydia 2008  . Gonorrhea   . Irregular periods/menstrual cycles   . Constipation, chronic 11/13/11  . Migraines     Migraines  . Depression     postpartum  . Trichomonas 2008  . Hx: UTI (urinary tract infection)     Frequently  . Obese    Past Surgical History  Procedure Laterality Date  . Wisdom tooth  extraction    . Induced abortion     Family History  Problem Relation Age of Onset  . Anesthesia problems Neg Hx   . Hypotension Neg Hx   . Malignant hyperthermia Neg Hx   . Pseudochol deficiency Neg Hx   . Hypertension Mother   . Heart disease Mother   . Diabetes Mother   . Stroke Mother   . Hyperlipidemia Mother   . Hypertension Sister   . Thrombophlebitis Sister     clotting issue  . Depression Sister   . Heart disease Maternal Grandfather   . Drug abuse Father   . Alcohol abuse Maternal Uncle   . Kidney disease Maternal Uncle     failure  . Rheum arthritis Sister     arthritis vs fibromyalgia  . Heart murmur Sister   . Fibromyalgia Sister   . Depression Sister   . Hypertension Sister    History  Substance Use Topics  . Smoking status: Never Smoker   . Smokeless tobacco: Never Used  . Alcohol Use: No     Comment: occasional   OB History   Grav Para Term Preterm Abortions TAB SAB Ect Mult Living   3 2 2  0 1 1 0 0 0 2     Review of Systems  Constitutional: Negative for fever.  HENT: Negative for sore throat.  Gastrointestinal: Negative for abdominal pain.  Musculoskeletal: Positive for myalgias. Negative for back pain.  Skin: Negative for rash.  Neurological: Negative for headaches.  All other systems reviewed and are negative.    Allergies  Review of patient's allergies indicates no known allergies.  Home Medications   Current Outpatient Rx  Name  Route  Sig  Dispense  Refill  . albuterol (PROAIR HFA) 108 (90 BASE) MCG/ACT inhaler   Inhalation   Inhale 2 puffs into the lungs every 4 (four) hours as needed for wheezing. For shortness of breath/wheezing         . albuterol (PROVENTIL) (2.5 MG/3ML) 0.083% nebulizer solution   Nebulization   Take 2.5 mg by nebulization every 6 (six) hours as needed for wheezing. For shortness of breath         . cetirizine (ZYRTEC) 10 MG tablet   Oral   Take 10 mg by mouth daily.         Marland Kitchen ibuprofen  (ADVIL,MOTRIN) 200 MG tablet   Oral   Take 800 mg by mouth every 6 (six) hours as needed for pain.         Marland Kitchen doxycycline (VIBRAMYCIN) 100 MG capsule   Oral   Take 1 capsule (100 mg total) by mouth 2 (two) times daily.   10 capsule   0    Triage Vitals: BP 128/74  Pulse 83  Temp(Src) 97.8 F (36.6 C) (Oral)  Resp 18  SpO2 99%  Breastfeeding? No Physical Exam  Nursing note and vitals reviewed. Constitutional: She is oriented to person, place, and time. She appears well-developed and well-nourished. No distress.  HENT:  Head: Normocephalic and atraumatic.  Eyes: EOM are normal.  Neck: Neck supple. No tracheal deviation present.  Cardiovascular: Normal rate, regular rhythm and normal heart sounds.   Pulmonary/Chest: Effort normal and breath sounds normal. No respiratory distress.  Musculoskeletal: Normal range of motion.       Right ankle: Tenderness. Lateral malleolus tenderness found.  Tenderness to right lateral malleolus with mild swelling.  Neurological: She is alert and oriented to person, place, and time.  Skin: Skin is warm and dry.  Psychiatric: She has a normal mood and affect. Her behavior is normal.    ED Course  Procedures (including critical care time) DIAGNOSTIC STUDIES: Oxygen Saturation is 99% on room air, normal by my interpretation.    COORDINATION OF CARE: 7:24 PM- Will order air splint and crutches. Have advised patient to continue to ice and elevate the ankle. Will provide referral to on call ortho for future use if needed. Patient informed of current plan for treatment and evaluation and agrees with plan at this time.     Labs Review Labs Reviewed - No data to display   Imaging Review Dg Ankle Complete Right  06/26/2013   CLINICAL DATA:  Twisted right ankle yesterday, swelling  EXAM: RIGHT ANKLE - COMPLETE 3+ VIEW  COMPARISON:  09/05/2011  FINDINGS: There is no evidence of fracture, dislocation, or joint effusion. There is no evidence of  arthropathy or other focal bone abnormality. Soft tissues are unremarkable. Ankle mortise is preserved.  IMPRESSION: Negative.   Electronically Signed   By: Natasha Mead   On: 06/26/2013 19:04   Dg Foot Complete Right  06/26/2013   CLINICAL DATA:  Twisted ankle yesterday, swelling  EXAM: RIGHT FOOT COMPLETE - 3+ VIEW  COMPARISON:  None.  FINDINGS: There is no evidence of fracture or dislocation. There is no evidence of arthropathy or  other focal bone abnormality. Soft tissues are unremarkable.  IMPRESSION: Negative.   Electronically Signed   By: Natasha Mead   On: 06/26/2013 19:04   Radiology results reviewed, shared with patient.  Air cast, crutches, ortho follow-up if no improvement. MDM  Right ankle sprain.  I personally performed the services described in this documentation, which was scribed in my presence. The recorded information has been reviewed and is accurate.      Jimmye Norman, NP 06/27/13 [phone]

## 2013-06-26 NOTE — ED Notes (Signed)
Ortho paged. 

## 2013-06-26 NOTE — ED Notes (Signed)
Pt reports she [place] the park. Reports increased swelling to the area today.

## 2013-06-27 NOTE — ED Provider Notes (Signed)
Medical screening examination/treatment/procedure(s) were performed by non-physician practitioner and as supervising physician I was immediately available for consultation/collaboration.   Dagmar Hait, MD 06/27/13 [phone]

## 2013-08-20 ENCOUNTER — Other Ambulatory Visit: Payer: Self-pay

## 2013-08-23 ENCOUNTER — Emergency Department (EMERGENCY_DEPARTMENT_HOSPITAL)
Admission: EM | Admit: 2013-08-23 | Discharge: 2013-08-24 | Disposition: A | Discharge status: Home / Self Care | Payer: Medicaid Other | Source: Home / Self Care | Attending: Emergency Medicine | Admitting: Emergency Medicine

## 2013-08-23 ENCOUNTER — Encounter (HOSPITAL_COMMUNITY): Payer: Self-pay | Admitting: Emergency Medicine

## 2013-08-23 DIAGNOSIS — Z79899 Other long term (current) drug therapy: Secondary | ICD-10-CM | POA: Insufficient documentation

## 2013-08-23 DIAGNOSIS — T50902A Poisoning by unspecified drugs, medicaments and biological substances, intentional self-harm, initial encounter: Secondary | ICD-10-CM | POA: Insufficient documentation

## 2013-08-23 DIAGNOSIS — Z3202 Encounter for pregnancy test, result negative: Secondary | ICD-10-CM | POA: Insufficient documentation

## 2013-08-23 DIAGNOSIS — T50912A Poisoning by multiple unspecified drugs, medicaments and biological substances, intentional self-harm, initial encounter: Secondary | ICD-10-CM

## 2013-08-23 DIAGNOSIS — IMO0002 Reserved for concepts with insufficient information to code with codable children: Secondary | ICD-10-CM | POA: Insufficient documentation

## 2013-08-23 DIAGNOSIS — Z87440 Personal history of urinary (tract) infections: Secondary | ICD-10-CM | POA: Insufficient documentation

## 2013-08-23 DIAGNOSIS — R45851 Suicidal ideations: Secondary | ICD-10-CM | POA: Insufficient documentation

## 2013-08-23 DIAGNOSIS — Z8742 Personal history of other diseases of the female genital tract: Secondary | ICD-10-CM | POA: Insufficient documentation

## 2013-08-23 DIAGNOSIS — J45909 Unspecified asthma, uncomplicated: Secondary | ICD-10-CM | POA: Insufficient documentation

## 2013-08-23 DIAGNOSIS — E669 Obesity, unspecified: Secondary | ICD-10-CM | POA: Insufficient documentation

## 2013-08-23 DIAGNOSIS — F339 Major depressive disorder, recurrent, unspecified: Secondary | ICD-10-CM | POA: Diagnosis present

## 2013-08-23 DIAGNOSIS — F1994 Other psychoactive substance use, unspecified with psychoactive substance-induced mood disorder: Secondary | ICD-10-CM

## 2013-08-23 DIAGNOSIS — F332 Major depressive disorder, recurrent severe without psychotic features: Secondary | ICD-10-CM

## 2013-08-23 DIAGNOSIS — Z8619 Personal history of other infectious and parasitic diseases: Secondary | ICD-10-CM | POA: Insufficient documentation

## 2013-08-23 DIAGNOSIS — Z8719 Personal history of other diseases of the digestive system: Secondary | ICD-10-CM | POA: Insufficient documentation

## 2013-08-23 DIAGNOSIS — F1210 Cannabis abuse, uncomplicated: Secondary | ICD-10-CM

## 2013-08-23 DIAGNOSIS — T50901A Poisoning by unspecified drugs, medicaments and biological substances, accidental (unintentional), initial encounter: Secondary | ICD-10-CM

## 2013-08-23 DIAGNOSIS — Z8659 Personal history of other mental and behavioral disorders: Secondary | ICD-10-CM | POA: Insufficient documentation

## 2013-08-23 LAB — POCT I-STAT, CHEM 8
BUN: 14 mg/dL (ref 6–23)
Calcium, Ion: 1.18 mmol/L (ref 1.12–1.23)
Chloride: 106 mEq/L (ref 96–112)
HCT: 45 % (ref 36.0–46.0)
Hemoglobin: 15.3 g/dL — ABNORMAL HIGH (ref 12.0–15.0)
Potassium: 4.8 mEq/L (ref 3.5–5.1)

## 2013-08-23 LAB — CBC WITH DIFFERENTIAL/PLATELET
Eosinophils Absolute: 0.1 10*3/uL (ref 0.0–0.7)
Eosinophils Relative: 1 % (ref 0–5)
Hemoglobin: 13.7 g/dL (ref 12.0–15.0)
Lymphs Abs: 3.4 10*3/uL (ref 0.7–4.0)
MCH: 27.7 pg (ref 26.0–34.0)
MCV: 84.4 fL (ref 78.0–100.0)
Monocytes Relative: 5 % (ref 3–12)
RBC: 4.95 MIL/uL (ref 3.87–5.11)

## 2013-08-23 LAB — RAPID URINE DRUG SCREEN, HOSP PERFORMED
Barbiturates: NOT DETECTED
Tetrahydrocannabinol: POSITIVE — AB

## 2013-08-23 LAB — ETHANOL: Alcohol, Ethyl (B): <11 mg/dL (ref 0–11)

## 2013-08-23 LAB — SALICYLATE LEVEL: Salicylate Lvl: <2 mg/dL — ABNORMAL LOW (ref 2.8–20.0)

## 2013-08-23 LAB — POCT PREGNANCY, URINE: Preg Test, Ur: NEGATIVE

## 2013-08-23 MED ORDER — LORAZEPAM 2 MG/ML IJ SOLN
1.0000 mg | Freq: Once | INTRAMUSCULAR | Status: AC
  Administered 2013-08-23: 1 mg via INTRAVENOUS
  Filled 2013-08-23: qty 1

## 2013-08-23 MED ORDER — ONDANSETRON HCL 4 MG PO TABS: 4.0000 mg | ORAL_TABLET | Freq: Three times a day (TID) | ORAL | Status: DC | PRN

## 2013-08-23 MED ORDER — IBUPROFEN 200 MG PO TABS: 600.0000 mg | ORAL_TABLET | Freq: Three times a day (TID) | ORAL | Status: DC | PRN

## 2013-08-23 MED ORDER — ACETAMINOPHEN 325 MG PO TABS: 650.0000 mg | ORAL_TABLET | ORAL | Status: DC | PRN

## 2013-08-23 MED ORDER — SODIUM CHLORIDE 0.9 % IV BOLUS (SEPSIS)
1000.0000 mL | Freq: Once | INTRAVENOUS | Status: AC
  Administered 2013-08-23: 1000 mL via INTRAVENOUS

## 2013-08-23 NOTE — Progress Notes (Signed)
Assisted with IVC paper work for Dr. Gilmore Laroche.

## 2013-08-23 NOTE — Consult Note (Signed)
  Pt reveiwed with ED Nursing-she is ambulatory now.AC to be notified but per nursing pt was not catatonic and is not psychotic now.Her behavior it is now felt was related to her hx of drug overdose. Under these circumstances it is not felt to be in pt's best interest to place her on [address].There are currently no beds on the [address].Continue to seek placement for treatment of this young lady's attempted suicide

## 2013-08-23 NOTE — ED Notes (Signed)
Patient presents pleasant,attentive and cooperative; denies any current thoughts of self harm or any thoughts of harming others; denies any auditory or visual hallucinations; denies any current complaints of pain.

## 2013-08-23 NOTE — Progress Notes (Signed)
Underwriter initiated inpatient placement for pt.  Referrals were faxed to the following facilities with bed availability: 1)[facility] 4)[facility] 5)[facility]  Blain Pais, MHT/NS

## 2013-08-23 NOTE — ED Notes (Signed)
Pt has been IVCd. Mother at bedside stating pt needs to come home with her. Psychiatric NP and [employer] at bedside speaking with pt and mother
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
## 2013-08-23 NOTE — ED Provider Notes (Signed)
Medical screening examination/treatment/procedure(s) were performed by non-physician practitioner and as supervising physician I was immediately available for consultation/collaboration.  EKG Interpretation     Ventricular Rate:  72 PR Interval:  139 QRS Duration: 87 QT Interval:  376 QTC Calculation: 411 R Axis:   70 Text Interpretation:  Sinus rhythm             Orella Cushman K Hawthorne Day-Rasch, MD 08/23/13 [phone]

## 2013-08-23 NOTE — ED Notes (Signed)
Pt's mom has pt's children and would like to be updated if possible. [phone] [facility].

## 2013-08-23 NOTE — ED Notes (Signed)
Per Onalee Hua from poison control pt does not need a repeat of labs and states for ED to monitor pt for another hour.

## 2013-08-23 NOTE — ED Notes (Signed)
Psychiatrist at bedside

## 2013-08-23 NOTE — ED Notes (Addendum)
Pt black bonet in locker 29. Other belongings sent home with pt mother. Pt TTS consult complete waiting for psychiatrist to arrive.

## 2013-08-23 NOTE — Progress Notes (Signed)
MHT spoke with [facility] about possible acceptance when Charla called into TTS.  Per Patsy Lager, RN pt to wait on a bed at [facility] pending.   Blain Pais, MHT/NS

## 2013-08-23 NOTE — ED Notes (Signed)
Per Dr. Gilmore Laroche, IVC paperwork will not be resent at this time

## 2013-08-23 NOTE — ED Notes (Signed)
Bed: WA10 Expected date:  Expected time:  Means of arrival:  Comments: 

## 2013-08-23 NOTE — ED Notes (Signed)
Pt's children and mother visiting. Pt calm at this time
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
## 2013-08-23 NOTE — ED Notes (Signed)
Pt became very upset after psychiatrist spoke with her. Pt worried about her children and school. HR 150, EDP notified
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
## 2013-08-23 NOTE — ED Provider Notes (Signed)
CSN: [id number]     Arrival date & time 08/23/13  0154 History   First MD Initiated Contact with Patient 08/23/13 [phone]     Chief Complaint  Patient presents with  . Drug Overdose  . Medical Clearance   (Consider location/radiation/quality/duration/timing/severity/associated sxs/prior Treatment) HPI Comments: Patient arrives today by [employer].  After patient's mother called police due to concerns for suicidality.  She states she saw a post on that the patient was threatening to take some pills.  Patient's boyfriend caught her with a handful pills, and not come from her hand she states she took some cold medicine, but is unsure what it was.  She, says she feels hopeless.  Unwanted, and taken for oriented she states her children who are one in 6 don't even appreciate.  Her.  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  She, states she's felt unwanted and in adequate heard higher life, but has not sought any psychiatric care prior to today  Patient is a 22 y.o. female presenting with Overdose. The history is provided by the patient.  Drug Overdose This is a new problem. The current episode started today. The problem has been unchanged. Pertinent negatives include no headaches. Nothing aggravates the symptoms. She has tried nothing for the symptoms. The treatment provided no relief.    Past Medical History  Diagnosis Date  . Asthma   . Chlamydia 2008  . Gonorrhea   . Irregular periods/menstrual cycles   . Constipation, chronic 11/13/11  . Migraines     Migraines  . Depression     postpartum  . Trichomonas 2008  . Hx: UTI (urinary tract infection)     Frequently  . Obese    Past Surgical History  Procedure Laterality Date  . Wisdom tooth extraction    . Induced abortion     Family History  Problem Relation Age of Onset  . Anesthesia problems Neg Hx   . Hypotension Neg Hx   . Malignant hyperthermia Neg Hx   . Pseudochol deficiency Neg Hx   . Hypertension Mother   .  Heart disease Mother   . Diabetes Mother   . Stroke Mother   . Hyperlipidemia Mother   . Hypertension Sister   . Thrombophlebitis Sister     clotting issue  . Depression Sister   . Heart disease Maternal Grandfather   . Drug abuse Father   . Alcohol abuse Maternal Uncle   . Kidney disease Maternal Uncle     failure  . Rheum arthritis Sister     arthritis vs fibromyalgia  . Heart murmur Sister   . Fibromyalgia Sister   . Depression Sister   . Hypertension Sister    History  Substance Use Topics  . Smoking status: Never Smoker   . Smokeless tobacco: Never Used  . Alcohol Use: No     Comment: occasional   OB History   Grav Para Term Preterm Abortions TAB SAB Ect Mult Living   3 2 2  0 1 1 0 0 0 2     Review of Systems  Constitutional: Negative for activity change and appetite change.  Neurological: Negative for headaches.  Psychiatric/Behavioral: Positive for suicidal ideas and self-injury.  All other systems reviewed and are negative.    Allergies  Review of patient's allergies indicates no known allergies.  Home Medications   Current Outpatient Rx  Name  Route  Sig  Dispense  Refill  . albuterol (PROAIR HFA) 108 (90 BASE) MCG/ACT  inhaler   Inhalation   Inhale 2 puffs into the lungs every 4 (four) hours as needed for wheezing or shortness of breath. For shortness of breath/wheezing         . albuterol (PROVENTIL) (2.5 MG/3ML) 0.083% nebulizer solution   Nebulization   Take 2.5 mg by nebulization every 6 (six) hours as needed for wheezing or shortness of breath. For shortness of breath          BP 128/84  Pulse 83  Temp(Src) 98.4 F (36.9 C) (Oral)  Resp 18  Wt 243 lb 1.6 oz (110.269 kg)  SpO2 99% Physical Exam  Nursing note and vitals reviewed. Constitutional: She is oriented to person, place, and time. She appears well-developed and well-nourished.  Obese  HENT:  Head: Normocephalic and atraumatic.  Eyes: Pupils are equal, round, and reactive to  light.  Neck: Normal range of motion.  Cardiovascular: Normal rate and regular rhythm.   Pulmonary/Chest: Effort normal and breath sounds normal.  Musculoskeletal: Normal range of motion.  Neurological: She is alert and oriented to person, place, and time.  Skin: Skin is warm.  Psychiatric: Her speech is delayed. Cognition and memory are normal. She expresses inappropriate judgment. She exhibits a depressed mood. She expresses suicidal ideation. She expresses suicidal plans.  Patient makes very poor to no eye contact during conversation She is inattentive.    ED Course  Procedures (including critical care time) Labs Review Labs Reviewed  CBC WITH DIFFERENTIAL - Abnormal; Notable for the following:    WBC 10.6 (*)    All other components within normal limits  URINE RAPID DRUG SCREEN (HOSP PERFORMED) - Abnormal; Notable for the following:    Tetrahydrocannabinol POSITIVE (*)    All other components within normal limits  SALICYLATE LEVEL - Abnormal; Notable for the following:    Salicylate Lvl <2.0 (*)    All other components within normal limits  POCT I-STAT, CHEM 8 - Abnormal; Notable for the following:    Hemoglobin 15.3 (*)    All other components within normal limits  ACETAMINOPHEN LEVEL  ETHANOL  POCT PREGNANCY, URINE   Imaging Review No results found.  EKG Interpretation     Ventricular Rate:  72 PR Interval:  139 QRS Duration: 87 QT Interval:  376 QTC Calculation: 411 R Axis:   70 Text Interpretation:  Sinus rhythm            MDM  No diagnosis found.  Patient's lab tox screen reviewed.  It shows only a urine, positive for THC salicylate and acetaminophen levels are undetectable.  Poison control has been contacted.  They do not feel there is any need for a repeat level as there were no findings.  One hour after supposedly initial ingestion I requested that TTS evaluate patient    Arman Filter, NP 08/23/13 [phone]

## 2013-08-24 ENCOUNTER — Inpatient Hospital Stay (HOSPITAL_COMMUNITY)
Admission: AD | Admit: 2013-08-24 | Discharge: 2013-08-27 | DRG: 885 | Disposition: A | Discharge status: Home / Self Care | Payer: Medicaid Other | Source: Intra-hospital | Attending: Psychiatry | Admitting: Psychiatry

## 2013-08-24 ENCOUNTER — Encounter (HOSPITAL_COMMUNITY): Payer: Self-pay | Admitting: *Deleted

## 2013-08-24 DIAGNOSIS — Z8619 Personal history of other infectious and parasitic diseases: Secondary | ICD-10-CM

## 2013-08-24 DIAGNOSIS — J45909 Unspecified asthma, uncomplicated: Secondary | ICD-10-CM

## 2013-08-24 DIAGNOSIS — F339 Major depressive disorder, recurrent, unspecified: Secondary | ICD-10-CM

## 2013-08-24 DIAGNOSIS — E669 Obesity, unspecified: Secondary | ICD-10-CM

## 2013-08-24 DIAGNOSIS — F064 Anxiety disorder due to known physiological condition: Secondary | ICD-10-CM

## 2013-08-24 MED ORDER — MAGNESIUM HYDROXIDE 400 MG/5ML PO SUSP: 30.0000 mL | Freq: Every day | ORAL | Status: DC | PRN

## 2013-08-24 MED ORDER — ONDANSETRON HCL 4 MG PO TABS
4.0000 mg | ORAL_TABLET | Freq: Three times a day (TID) | ORAL | Status: DC | PRN
Start: 2013-08-24 — End: 2013-08-27

## 2013-08-24 MED ORDER — IBUPROFEN 600 MG PO TABS
600.0000 mg | ORAL_TABLET | Freq: Three times a day (TID) | ORAL | Status: DC | PRN
  Administered 2013-08-24 – 2013-08-26 (×2): 600 mg via ORAL
  Filled 2013-08-24 (×2): qty 1

## 2013-08-24 MED ORDER — MOMETASONE FURO-FORMOTEROL FUM 200-5 MCG/ACT IN AERO
2.0000 | INHALATION_SPRAY | Freq: Two times a day (BID) | RESPIRATORY_TRACT | Status: DC
  Administered 2013-08-25 – 2013-08-27 (×5): 2 via RESPIRATORY_TRACT
  Filled 2013-08-24: qty 8.8

## 2013-08-24 MED ORDER — ACETAMINOPHEN 325 MG PO TABS
650.0000 mg | ORAL_TABLET | ORAL | Status: DC | PRN
  Administered 2013-08-26: 650 mg via ORAL
  Filled 2013-08-24: qty 2

## 2013-08-24 MED ORDER — ALUM & MAG HYDROXIDE-SIMETH 200-200-20 MG/5ML PO SUSP: 30.0000 mL | ORAL | Status: DC | PRN

## 2013-08-24 MED ORDER — TRAZODONE HCL 50 MG PO TABS
50.0000 mg | ORAL_TABLET | Freq: Every evening | ORAL | Status: DC | PRN
  Filled 2013-08-24: qty 1

## 2013-08-24 MED ORDER — ALBUTEROL SULFATE (5 MG/ML) 0.5% IN NEBU
2.5000 mg | INHALATION_SOLUTION | Freq: Four times a day (QID) | RESPIRATORY_TRACT | Status: DC | PRN
  Administered 2013-08-26: 2.5 mg via RESPIRATORY_TRACT
  Filled 2013-08-24: qty 0.5
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
## 2013-08-24 NOTE — Tx Team (Signed)
Initial Interdisciplinary Treatment Plan  PATIENT STRENGTHS: (choose at least two) Ability for insight Average or above average intelligence Capable of independent living Communication skills General fund of knowledge Motivation for treatment/growth Physical Health Supportive family/friends Work skills  PATIENT STRESSORS: Financial difficulties   PROBLEM LIST: Problem List/Patient Goals Date to be addressed Date deferred Reason deferred Estimated date of resolution  depression 110/10     Time management 11/10                                                DISCHARGE CRITERIA:  Motivation to continue treatment in a less acute level of care  PRELIMINARY DISCHARGE PLAN: Outpatient therapy Participate in family therapy Return to previous living arrangement Return to previous work or school arrangements  PATIENT/FAMIILY INVOLVEMENT: This treatment plan has been presented to and reviewed with the patient, Margaret Cline.  The patient and family have been given the opportunity to ask questions and make suggestions.  Hoover Browns 08/24/2013, 8:31 PM
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
## 2013-08-24 NOTE — Progress Notes (Signed)
Patient ID: Margaret Cline, female   DOB: [birth year], 22 y.o.   MRN: [id number] Patient Identification:  Margaret Cline Date of Evaluation:  08/24/2013   History of Present Illness: Patient was brought in yesterday by [employer] under IVC for drug overdose.  Patient denies the intent was to kill self.  Patient is tearful, crying and wanting to go home to her children.  We have accepted patient for admission at our 500 hall inpatient unit for safety and stabilization.  Patient denies SI/HI/AVH but reports feeling stressed out and needed to talk to somebody.  Past Psychiatric History: Major depressive d/o, recurrent, severe   Past Medical History:     Past Medical History  Diagnosis Date  . Asthma   . Chlamydia 2008  . Gonorrhea   . Irregular periods/menstrual cycles   . Constipation, chronic 11/13/11  . Migraines     Migraines  . Depression     postpartum  . Trichomonas 2008  . Hx: UTI (urinary tract infection)     Frequently  . Obese        Past Surgical History  Procedure Laterality Date  . Wisdom tooth extraction    . Induced abortion      Allergies: No Known Allergies  Current Medications:  Prior to Admission medications   Medication Sig Start Date End Date Taking? Authorizing Provider  albuterol (PROAIR HFA) 108 (90 BASE) MCG/ACT inhaler Inhale 2 puffs into the lungs every 4 (four) hours as needed for wheezing or shortness of breath. For shortness of breath/wheezing   Yes Historical Provider, MD  albuterol (PROVENTIL) (2.5 MG/3ML) 0.083% nebulizer solution Take 2.5 mg by nebulization every 6 (six) hours as needed for wheezing or shortness of breath. For shortness of breath   Yes Historical Provider, MD    Social History:    reports that she has never smoked. She has never used smokeless tobacco. She reports that she does not drink alcohol or use illicit drugs.   Family History:    Family History  Problem Relation Age of Onset  . Anesthesia problems Neg Hx   . Hypotension Neg  Hx   . Malignant hyperthermia Neg Hx   . Pseudochol deficiency Neg Hx   . Hypertension Mother   . Heart disease Mother   . Diabetes Mother   . Stroke Mother   . Hyperlipidemia Mother   . Hypertension Sister   . Thrombophlebitis Sister     clotting issue  . Depression Sister   . Heart disease Maternal Grandfather   . Drug abuse Father   . Alcohol abuse Maternal Uncle   . Kidney disease Maternal Uncle     failure  . Rheum arthritis Sister     arthritis vs fibromyalgia  . Heart murmur Sister   . Fibromyalgia Sister   . Depression Sister   . Hypertension Sister     Mental Status Examination/Evaluation:Psychiatric Specialty Exam: Physical Exam  ROS  Blood pressure 111/74, pulse 87, temperature 98.1 F (36.7 C), temperature source Oral, resp. rate 18, weight 110.269 kg (243 lb 1.6 oz), SpO2 97.00%.Body mass index is 38.07 kg/(m^2).  General Appearance: Casual  Eye Contact::  Good  Speech:  Clear and Coherent and Normal Rate  Volume:  Normal  Mood:  Angry, Anxious, Depressed and Hopeless  Affect:  Congruent, Depressed, Flat, Labile and Tearful  Thought Process:  Coherent, Goal Directed and Intact  Orientation:  Full (Time, Place, and Person)  Thought Content:  NA  Suicidal Thoughts:  No  Homicidal  Thoughts:  No  Memory:  Immediate;   Good Recent;   Good Remote;   Good  Judgement:  Poor  Insight:  Lacking and Shallow  Psychomotor Activity:  Normal  Concentration:  Fair  Recall:  NA  Akathisia:  NA  Handed:  Left  AIMS (if indicated):     Assets:  Desire for Improvement  Sleep:          DIAGNOSIS:   AXIS I   Major Depression, Recurrent severe, Substance Induced Mood Disorder and Marijuana use disorder   AXIS II  Deffered  AXIS III See medical notes.  AXIS IV other psychosocial or environmental problems and problems related to social environment  AXIS V 11-20 some danger of hurting self or others possible OR occasionally fails to maintain minimal personal  hygiene OR gross impairment in communication     Assessment/Plan:  Seen on rounds this am with Dr Tawni Carnes We have accepted patient for admission to our 500 unit hall Patient will be restarted on her antidepressant She will be encouraged to attend our group and individual therapy.  Dahlia Byes   PMHNP-BC

## 2013-08-24 NOTE — Progress Notes (Signed)
Pt did not attend group because she was still completing her intake process during that time.

## 2013-08-25 DIAGNOSIS — F1220 Cannabis dependence, uncomplicated: Secondary | ICD-10-CM

## 2013-08-25 DIAGNOSIS — F329 Major depressive disorder, single episode, unspecified: Principal | ICD-10-CM

## 2013-08-25 NOTE — Progress Notes (Signed)
D: Patient in her room folding her clothes on approach.  Patient states she had a good day.  Patient states she was able to visit with her children today.  Patient states she was able to talk with a doctor today and she states hopefully everything will be straightened out.  Patient this is all a misunderstanding but she states she is glad that she is here because everything has a purpose.  Patient denies SI/HI and denies AVH.   A: Staff to monitor Q 15 mins for safety.  Encouragement and support offered.  Scheduled medications administered per orders. R: Patient remains safe on the unit.  Patient attended group tonight.  Patient visible on the unit and interacting with peers.  Patient taking administered medications.

## 2013-08-25 NOTE — Progress Notes (Signed)
Recreation Therapy Notes  Date: 11.11.2014 Time: 2:45pm Location: 500 Hall Dayroom  Group Topic: Animal Assisted Activities (AAA)  Behavioral Response: Did not attend.   Magdalyn Arenivas L Cobie Leidner, LRT/CTRS  Wendell Nicoson L 08/25/2013 5:18 PM 

## 2013-08-25 NOTE — [facility] Suicide Risk Assessment (Signed)
[facility] INPATIENT:  Family/Significant Other Suicide Prevention Education  Suicide Prevention Education:  Education Completed; Ulice Brilliant, Mother, [facility]; has been identified by the patient as the family member/significant other with whom the patient will be residing, and identified as the person(s) who will aid the patient in the event of a mental health crisis (suicidal ideations/suicide attempt).  With written consent from the patient, the family member/significant other has been provided the following suicide prevention education, prior to the and/or following the discharge of the patient.  The suicide prevention education provided includes the following:  Suicide risk factors  Suicide prevention and interventions  National Suicide Hotline telephone number  [facility] assessment telephone number  [facility] Emergency Assistance 911  [facility] and/or Residential Mobile Crisis Unit telephone number  Request made of family/significant other to:  Remove weapons (e.g., guns, rifles, knives), all items previously/currently identified as safety concern.  Mother advised patient does not have access to guns.  Remove drugs/medications (over-the-counter, prescriptions, illicit drugs), all items previously/currently identified as a safety concern.  The family member/significant other verbalizes understanding of the suicide prevention education information provided.  The family member/significant other agrees to remove the items of safety concern listed above.  Wynn Banker 08/25/2013, 1:09 PM

## 2013-08-25 NOTE — [facility] Group Notes (Signed)
[facility] LCSW Group Therapy  08/25/2013 4:06 PM  Type of Therapy:  Group Therapy  Participation Level:  Did Not Attend.  Patient was meeting with PA.   Wynn Banker 08/25/2013, 4:06 PM

## 2013-08-25 NOTE — Progress Notes (Signed)
The focus of this group is to educate the patient on the purpose and policies of crisis stabilization and provide a format to answer questions about their admission.  The group details unit policies and expectations of patients while admitted.  Patient attended 0900 nurse education orientation group this morning.  Patient actively participated, appropriate affect, alert, appropriate insight and engagement.  Today patient will work on 3 goals for discharge.  

## 2013-08-25 NOTE — Progress Notes (Signed)
Adult Psychoeducational Group Note  Date:  08/25/2013 Time:  11:00am  Group Topic/Focus:  Recovery Goals:   The focus of this group is to identify appropriate goals for recovery and establish a plan to achieve them.  Participation Level:  Active  Participation Quality:  Appropriate and Attentive  Affect:  Appropriate  Cognitive:  Alert and Appropriate  Insight: Appropriate  Engagement in Group:  Engaged  Modes of Intervention:  Discussion and Education  Additional Comments: Pt attended and participated in group. Discussion today was on Recovery. The question was asked what does recovery mean to you? Pt stated recovery means steps you take that is beneficial to your life.   Shelly Bombard D 08/25/2013, 1:19 PM

## 2013-08-25 NOTE — Progress Notes (Addendum)
D:  Patient's self inventory sheet, patient sleeps well, good appetite, normal energy level, good attention span.  Rated depression and hopeless #1, denied anxiety.  Denied withdrawals.  Denied SI.  Denied physical problems.  Worst pain #1.  After discharge, plans to get more rest, management time better.  Plans to discharge home.  No problems taking meds after discharge. A:  Medications administered per MD orders.  Emotional support and encouragement given patient. R:  Denied SI and HI.  Denied A/V hallucinations.  Denied pain.  Will continue to monitor patient safety with 15 minute checks.  Safety maintained.  Patient would like her iron checked, stated she was not able to make MD appointment yesterday.

## 2013-08-25 NOTE — Consult Note (Signed)
Agree with plan 

## 2013-08-25 NOTE — H&P (Signed)
Psychiatric Admission Assessment Adult  Patient Identification:  Margaret Cline Date of Evaluation:  08/25/2013 Chief Complaint:  MAJOR DEPRESSIVE DISORDER History of Present Illness: Margaret Cline is a 22 year old AA female brought to the [facility] by police after her mother called with concerns for the patient's suicidality. Her mother reports seeing a post on line FB about Margaret Cline threatening to take some pills.  The patient's boyfriend states that he saw her with a handful of pills but was unsure what they were.   Margaret Cline states that it was cold medicine.         Margaret Cline's report of the events are very different than what has been noted in the ED. She states she has asthma due to the change in the weather. That her mother over reacted because she did not answer the phone in the middle of the night. Margaret Cline states she works full time, goes to school at [employer] full time and takes care of her two children ages 1 and 27.  She does admit to agreeing to come to the hospital with the police to "talk with someone, because she has been very stressed" but denies that she was suicidal. She denies any previous attempts. Margaret Cline does report that she sometimes doesn't feel appreciated by her children, and that her sisters don't understand that she is working to try to better her situation.      Margaret Cline states that she is not depressed that she finds relief from stress by helping others by being active in the community and her strong belief in God. She states that she likes to sing and that is a stress reduction for her. She denies any previous psychiatric admissions, but does state that she had counseling while she was pregnant to help deal with the stress of pregnancy.       She denies alcohol or substance abuse. UDS+ for [facility] which she states she tried "once." Elements:  Location:  adult in patient unit. Quality:  moderate. Severity:  acute. Timing:  72 hours. Duration:  3 weeks. Context:  patient states that she has been stressed  over the past 3 weeks. Associated Signs/Synptoms: Depression Symptoms:  depressed mood, (Hypo) Manic Symptoms:  Grandiosity, Anxiety Symptoms:  denies Psychotic Symptoms:  denies PTSD Symptoms: NA  Psychiatric Specialty Exam: Physical Exam  Constitutional: She appears well-developed and well-nourished.  Psychiatric: Her speech is normal and behavior is normal. Her mood appears anxious. Thought content is not paranoid and not delusional. She expresses impulsivity and inappropriate judgment. She expresses suicidal ideation. She expresses no homicidal ideation. She expresses no suicidal plans and no homicidal plans. She exhibits abnormal recent memory.  Patient is seen and the chart is reviewed. I agree with the findings of the exam in the ED with no exceptions.    Review of Systems  Constitutional: Negative.  Negative for fever, chills, weight loss, malaise/fatigue and diaphoresis.  HENT: Negative for congestion and sore throat.   Eyes: Negative for blurred vision, double vision and photophobia.  Respiratory: Negative for cough, shortness of breath and wheezing.   Cardiovascular: Negative for chest pain, palpitations and PND.  Gastrointestinal: Negative for heartburn, nausea, vomiting, abdominal pain, diarrhea and constipation.  Musculoskeletal: Negative for falls, joint pain and myalgias.  Neurological: Negative for dizziness, tingling, tremors, sensory change, speech change, focal weakness, seizures, loss of consciousness, weakness and headaches.  Endo/Heme/Allergies: Negative for polydipsia. Does not bruise/bleed easily.  Psychiatric/Behavioral: Negative for depression, suicidal ideas, hallucinations, memory loss and substance abuse. The patient  is not nervous/anxious and does not have insomnia.     Blood pressure 139/87, pulse 118, temperature 98 F (36.7 C), temperature source Oral, resp. rate 16, height 5' 6.25" (1.683 m), weight 109.77 kg (242 lb), last menstrual period 06/24/2012,  not currently breastfeeding.Body mass index is 38.75 kg/(m^2).  General Appearance: Casual  Eye Contact::  Fair  Speech:  Clear and Coherent  Volume:  Normal  Mood:  Anxious  Affect:  Non-Congruent  Thought Process:  Goal Directed  Orientation:  Full (Time, Place, and Person)  Thought Content:  WDL  Suicidal Thoughts:  Yes.  without intent/plan  Homicidal Thoughts:  No  Memory:  Immediate;   Poor  Judgement:  Impaired  Insight:  Lacking  Psychomotor Activity:  Normal  Concentration:  Fair  Recall:  Poor  Akathisia:  No  Handed:  Right  AIMS (if indicated):     Assets:  Desire for Improvement Housing Physical Health  Sleep:  Number of Hours: 6.5    Past Psychiatric History: Diagnosis:  Hospitalizations:  Outpatient Care:  Substance Abuse Care:  Self-Mutilation:  Suicidal Attempts:  Violent Behaviors:   Past Medical History:   Past Medical History  Diagnosis Date  . Asthma   . Chlamydia 2008  . Gonorrhea   . Irregular periods/menstrual cycles   . Constipation, chronic 11/13/11  . Migraines     Migraines  . Depression     postpartum  . Trichomonas 2008  . Hx: UTI (urinary tract infection)     Frequently  . Obese    None. Allergies:  No Known Allergies PTA Medications: Prescriptions prior to admission  Medication Sig Dispense Refill  . albuterol (PROAIR HFA) 108 (90 BASE) MCG/ACT inhaler Inhale 2 puffs into the lungs every 4 (four) hours as needed for wheezing or shortness of breath. For shortness of breath/wheezing      . albuterol (PROVENTIL) (2.5 MG/3ML) 0.083% nebulizer solution Take 2.5 mg by nebulization every 6 (six) hours as needed for wheezing or shortness of breath. For shortness of breath        Previous Psychotropic Medications:  Medication/Dose                 Substance Abuse History in the last 12 months:  no  Consequences of Substance Abuse: NA  Social History:  reports that she has never smoked. She has never used smokeless  tobacco. She reports that she does not drink alcohol or use illicit drugs. Additional Social History: Pain Medications: none Prescriptions: none Over the Counter: none History of alcohol / drug use?: No history of alcohol / drug abuse                    Current Place of Residence:   Place of Birth:   Family Members: Marital Status:  Single Children:  Sons: 1  Daughters:1 Relationships: Education:  [employer]por[employer] Problems/Performance: Religious Beliefs/Practices: History of Abuse (Emotional/Phsycial/Sexual) Occupational Experiences; Military History:  None. Legal History: Hobbies/Interests:  Family History:   Family History  Problem Relation Age of Onset  . Anesthesia problems Neg Hx   . Hypotension Neg Hx   . Malignant hyperthermia Neg Hx   . Pseudochol deficiency Neg Hx   . Hypertension Mother   . Heart disease Mother   . Diabetes Mother   . Stroke Mother   . Hyperlipidemia Mother   . Hypertension Sister   . Thrombophlebitis Sister     clotting issue  . Depression Sister   . Heart  disease Maternal Grandfather   . Drug abuse Father   . Alcohol abuse Maternal Uncle   . Kidney disease Maternal Uncle     failure  . Rheum arthritis Sister     arthritis vs fibromyalgia  . Heart murmur Sister   . Fibromyalgia Sister   . Depression Sister   . Hypertension Sister     Results for orders placed during the hospital encounter of 08/23/13 (from the past 72 hour(s))  URINE RAPID DRUG SCREEN (HOSP PERFORMED)     Status: Abnormal   Collection Time    08/23/13  2:51 AM      Result Value Range   Opiates NONE DETECTED  NONE DETECTED   Cocaine NONE DETECTED  NONE DETECTED   Benzodiazepines NONE DETECTED  NONE DETECTED   Amphetamines NONE DETECTED  NONE DETECTED   Tetrahydrocannabinol POSITIVE (*) NONE DETECTED   Barbiturates NONE DETECTED  NONE DETECTED   Comment:            DRUG SCREEN FOR MEDICAL PURPOSES     ONLY.  IF CONFIRMATION IS NEEDED     FOR ANY  PURPOSE, NOTIFY LAB     WITHIN 5 DAYS.                LOWEST DETECTABLE LIMITS     FOR URINE DRUG SCREEN     Drug Class       Cutoff (ng/mL)     Amphetamine      1000     Barbiturate      200     Benzodiazepine   200     Tricyclics       300     Opiates          300     Cocaine          300     THC              50  POCT PREGNANCY, URINE     Status: None   Collection Time    08/23/13  2:55 AM      Result Value Range   Preg Test, Ur NEGATIVE  NEGATIVE   Comment:            THE SENSITIVITY OF THIS     METHODOLOGY IS >24 mIU/mL  CBC WITH DIFFERENTIAL     Status: Abnormal   Collection Time    08/23/13  3:00 AM      Result Value Range   WBC 10.6 (*) 4.0 - 10.5 K/uL   RBC 4.95  3.87 - 5.11 MIL/uL   Hemoglobin 13.7  12.0 - 15.0 g/dL   HCT 96.0  45.4 - 09.8 %   MCV 84.4  78.0 - 100.0 fL   MCH 27.7  26.0 - 34.0 pg   MCHC 32.8  30.0 - 36.0 g/dL   RDW 11.9  14.7 - 82.9 %   Platelets 300  150 - 400 K/uL   Neutrophils Relative % 61  43 - 77 %   Neutro Abs 6.5  1.7 - 7.7 K/uL   Lymphocytes Relative 32  12 - 46 %   Lymphs Abs 3.4  0.7 - 4.0 K/uL   Monocytes Relative 5  3 - 12 %   Monocytes Absolute 0.6  0.1 - 1.0 K/uL   Eosinophils Relative 1  0 - 5 %   Eosinophils Absolute 0.1  0.0 - 0.7 K/uL   Basophils Relative 0  0 - 1 %  Basophils Absolute 0.0  0.0 - 0.1 K/uL  ACETAMINOPHEN LEVEL     Status: None   Collection Time    08/23/13  3:00 AM      Result Value Range   Acetaminophen (Tylenol), Serum <15.0  10 - 30 ug/mL   Comment:            THERAPEUTIC CONCENTRATIONS VARY     SIGNIFICANTLY. A RANGE OF 10-30     ug/mL MAY BE AN EFFECTIVE     CONCENTRATION FOR MANY PATIENTS.     HOWEVER, SOME ARE BEST TREATED     AT CONCENTRATIONS OUTSIDE THIS     RANGE.     ACETAMINOPHEN CONCENTRATIONS     >150 ug/mL AT 4 HOURS AFTER     INGESTION AND >50 ug/mL AT 12     HOURS AFTER INGESTION ARE     OFTEN ASSOCIATED WITH TOXIC     REACTIONS.  SALICYLATE LEVEL     Status: Abnormal    Collection Time    08/23/13  3:00 AM      Result Value Range   Salicylate Lvl <2.0 (*) 2.8 - 20.0 mg/dL  ETHANOL     Status: None   Collection Time    08/23/13  3:00 AM      Result Value Range   Alcohol, Ethyl (B) <11  0 - 11 mg/dL   Comment:            LOWEST DETECTABLE LIMIT FOR     SERUM ALCOHOL IS 11 mg/dL     FOR MEDICAL PURPOSES ONLY  POCT I-STAT, CHEM 8     Status: Abnormal   Collection Time    08/23/13  3:12 AM      Result Value Range   Sodium 140  135 - 145 mEq/L   Potassium 4.8  3.5 - 5.1 mEq/L   Chloride 106  96 - 112 mEq/L   BUN 14  6 - 23 mg/dL   Creatinine, Ser 4.09  0.50 - 1.10 mg/dL   Glucose, Bld 84  70 - 99 mg/dL   Calcium, Ion 8.11  9.14 - 1.23 mmol/L   TCO2 26  0 - 100 mmol/L   Hemoglobin 15.3 (*) 12.0 - 15.0 g/dL   HCT 78.2  95.6 - 21.3 %   Psychological Evaluations:  Assessment:   DSM5:  Schizophrenia Disorders:   Obsessive-Compulsive Disorders:   Trauma-Stressor Disorders:   Substance/Addictive Disorders:  Cannabis Use Disorder - Moderate 9304.30) Depressive Disorders:  Disruptive Mood Dysregulation Disorder (296.99)  AXIS I:  Major Depression, single episode AXIS II:  Cluster B Traits AXIS III:   Past Medical History  Diagnosis Date  . Asthma   . Chlamydia 2008  . Gonorrhea   . Irregular periods/menstrual cycles   . Constipation, chronic 11/13/11  . Migraines     Migraines  . Depression     postpartum  . Trichomonas 2008  . Hx: UTI (urinary tract infection)     Frequently  . Obese    AXIS IV:  problems with primary support group AXIS V:  51-60 moderate symptoms  Treatment Plan/Recommendations:   1. Admit for crisis management and stabilization. 2. Medication management to reduce current symptoms to base line and improve the patient's overall level of functioning. 3. Treat health problems as indicated. 4. Develop treatment plan to decrease risk of relapse upon discharge and to reduce the need for readmission. 5. Psycho-social  education regarding relapse prevention and self care. 6. Health care follow  up as needed for medical problems. 7. Restart home medications where appropriate.  Treatment Plan Summary: Daily contact with patient to assess and evaluate symptoms and progress in treatment Medication management Supportive approach/coping skills/relapse prevention Help identify triggers for this decompensation/CBT;mindfulness/o[timize treatment with psychotropics Current Medications:  Current Facility-Administered Medications  Medication Dose Route Frequency Provider Last Rate Last Dose  . acetaminophen (TYLENOL) tablet 650 mg  650 mg Oral Q4H PRN Court Joy, PA-C      . albuterol (PROVENTIL) (5 MG/ML) 0.5% nebulizer solution 2.5 mg  2.5 mg Nebulization Q6H PRN Kerry Hough, PA-C      . alum & mag hydroxide-simeth (MAALOX/MYLANTA) 200-200-20 MG/5ML suspension 30 mL  30 mL Oral Q4H PRN Court Joy, PA-C      . ibuprofen (ADVIL,MOTRIN) tablet 600 mg  600 mg Oral Q8H PRN Court Joy, PA-C   600 mg at 08/24/13 2159  . magnesium hydroxide (MILK OF MAGNESIA) suspension 30 mL  30 mL Oral Daily PRN Court Joy, PA-C      . mometasone-formoterol [employer]) 200-5 MCG/ACT inhaler 2 puff  2 puff Inhalation BID Kerry Hough, PA-C   2 puff at 08/25/13 [phone]  . ondansetron (ZOFRAN) tablet 4 mg  4 mg Oral Q8H PRN Court Joy, PA-C      . traZODone (DESYREL) tablet 50 mg  50 mg Oral QHS PRN,MR X 1 Court Joy, PA-C        Observation Level/Precautions:  routine  Laboratory:  CBC Chemistry Profile UDS UA  Psychotherapy:  Individual and group  Medications:  Patient "doesn't like to take medication, and prefers herbs."  Consultations:  As needed  Discharge Concerns:  safety  Estimated LOS:  3-5 days  Other:     I certify that inpatient services furnished can reasonably be expected to improve the patient's condition.   Rona Ravens. M[place]burn RPAC 10:34 PM 08/25/2013 I personally evaluated the  patient/reviuwed the physical exam/agree with assessment and plan Madie Reno A. Malon Branton,M.D
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
## 2013-08-26 DIAGNOSIS — F4322 Adjustment disorder with anxiety: Secondary | ICD-10-CM

## 2013-08-26 NOTE — Progress Notes (Signed)
Adult Psychoeducational Group Note  Date:  08/25/13 Time:  8:00pm  Group Topic/Focus:  Wrap-Up Group:   The focus of this group is to help patients review their daily goal of treatment and discuss progress on daily workbooks.  Participation Level:  Active  Participation Quality:  Appropriate and Supportive  Affect:  Appropriate  Cognitive:  Appropriate  Insight: Appropriate  Engagement in Group:  Engaged  Modes of Intervention:  Discussion, Education, Socialization and Support  Additional Comments:  Pt stated that she is in the hospital for stress. Pt identified that she is a people person and loves to sing.   Rhanda Lemire 08/26/2013, 12:28 AM

## 2013-08-26 NOTE — Progress Notes (Signed)
Adult Psychoeducational Group Note  Date:  08/26/2013 Time:  9:52 PM  Group Topic/Focus:  Wrap-Up Group:   The focus of this group is to help patients review their daily goal of treatment and discuss progress on daily workbooks.  Participation Level:  Active  Participation Quality:  Appropriate  Affect:  Appropriate  Cognitive:  Appropriate  Insight: Appropriate  Engagement in Group:  Engaged  Modes of Intervention:  Discussion  Additional Comments:  Pt stated that she had fun with her peers today talking and laughing and that she learned that she is not alone in the way she feels and that she is not "stupid" for feeling the way she does. Also to stop holding things in because it is not good for her.  Jeret Goyer 08/26/2013, 9:52 PM

## 2013-08-26 NOTE — Progress Notes (Signed)
Patient ID: Margaret Cline, female   DOB: [birth year], 22 y.o.   MRN: [id number]  D: Patient presents with depressed mood and affect. Patient denies SI/HI and A/V hallucinations. Patient rates her depression and hopelessness at a 1/10 today. Patient complained of a headache and requested PRN Tylenol.  A: Writer gave encouragement and support to patient. Writer gave PRN Tylenol per physician order to patient for c/o headache.  R: Patient receptive and cooperative. Patient stated her headache was still there but it was a 7/10. Patient refused any other intervention. Patient stated, "I'm just going to lay down." Q15 minute safety checks are maintained and patient is safe at this time.

## 2013-08-26 NOTE — Progress Notes (Signed)
[facility] MD Progress Note  08/26/2013 3:22 PM Margaret Cline  MRN:  [id number] Subjective:  Patient is seen today, she was admitted from [facility] for depression and stress. She was admitted voluntarily and than made it involuntarily because she can not see without contact glasses. 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 She was unable to respond to her mother when she was exhausted and sleeping. Patient has been attending groups and feels able to vent her stress. She is mother of two children 6 and 1. They are with dad. She has history of post partum depression and attended mentor program and [facility] both times she gave birth to her children.   Diagnosis:   DSM5: Schizophrenia Disorders:   Obsessive-Compulsive Disorders:   Trauma-Stressor Disorders:   Substance/Addictive Disorders:   Depressive Disorders:  Major Depressive Disorder - Unspecified (296.20)  Axis I: Adjustment Disorder with Anxiety  ADL's:  Intact  Sleep: Fair  Appetite:  Fair  Suicidal Ideation:  denied Homicidal Ideation:  denied AEB (as evidenced by):  Psychiatric Specialty Exam: ROS  Blood pressure 126/81, pulse 91, temperature 97.9 F (36.6 C), temperature source Oral, resp. rate 20, height 5' 6.25" (1.683 m), weight 109.77 kg (242 lb), last menstrual period 06/24/2012, not currently breastfeeding.Body mass index is 38.75 kg/(m^2).  General Appearance: Fairly Groomed  Patent attorney::  Minimal  Speech:  Clear and Coherent  Volume:  Normal  Mood:  Anxious and Depressed  Affect:  Depressed  Thought Process:  Goal Directed and Intact  Orientation:  NA  Thought Content:  Rumination  Suicidal Thoughts:  No  Homicidal Thoughts:  No  Memory:  Immediate;   Fair  Judgement:  Intact  Insight:  Fair  Psychomotor Activity:  Normal  Concentration:  Fair  Recall:  Fair  Akathisia:  NA  Handed:  Right  AIMS (if indicated):     Assets:   Communication Skills Desire for Improvement Physical Health Resilience Social Support  Sleep:  Number of Hours: 6.5   Current Medications: Current Facility-Administered Medications  Medication Dose Route Frequency Provider Last Rate Last Dose  . acetaminophen (TYLENOL) tablet 650 mg  650 mg Oral Q4H PRN Court Joy, PA-C   650 mg at 08/26/13 1310  . albuterol (PROVENTIL) (5 MG/ML) 0.5% nebulizer solution 2.5 mg  2.5 mg Nebulization Q6H PRN Kerry Hough, PA-C   2.5 mg at 08/26/13 0641  . alum & mag hydroxide-simeth (MAALOX/MYLANTA) 200-200-20 MG/5ML suspension 30 mL  30 mL Oral Q4H PRN Court Joy, PA-C      . ibuprofen (ADVIL,MOTRIN) tablet 600 mg  600 mg Oral Q8H PRN Court Joy, PA-C   600 mg at 08/24/13 2159  . magnesium hydroxide (MILK OF MAGNESIA) suspension 30 mL  30 mL Oral Daily PRN Court Joy, PA-C      . mometasone-formoterol [facility]) 200-5 MCG/ACT inhaler 2 puff  2 puff Inhalation BID Kerry Hough, PA-C   2 puff at 08/26/13 0803  . ondansetron (ZOFRAN) tablet 4 mg  4 mg Oral Q8H PRN Court Joy, PA-C      . traZODone (DESYREL) tablet 50 mg  50 mg Oral QHS PRN,MR X 1 Court Joy, PA-C        Lab Results: No results found for this or any previous visit (from the past 48 hour(s)).  Physical Findings: AIMS: Facial and Oral Movements Muscles of Facial Expression: None, normal  Lips and Perioral Area: None, normal Jaw: None, normal Tongue: None, normal,Extremity Movements Upper (arms, wrists, hands, fingers): None, normal Lower (legs, knees, ankles, toes): None, normal, Trunk Movements Neck, shoulders, hips: None, normal, Overall Severity Severity of abnormal movements (highest score from questions above): None, normal Incapacitation due to abnormal movements: None, normal Patient's awareness of abnormal movements (rate only patient's report): No Awareness, Dental Status Current problems with teeth and/or dentures?: No Does patient usually wear  dentures?: No  CIWA:  CIWA-Ar Total: 1 COWS:  COWS Total Score: 2  Treatment Plan Summary: Daily contact with patient to assess and evaluate symptoms and progress in treatment Medication management  Plan: Monitor for safety Treatment Plan/Recommendations:   1. Admit for crisis management and stabilization. 2. Medication management to reduce current symptoms to base line and improve the patient's overall level of functioning. 3. Treat health problems as indicated. 4. Develop treatment plan to decrease risk of relapse upon discharge and to reduce the need for readmission. 5. Psycho-social education regarding relapse prevention and self care. 6. Health care follow up as needed for medical problems. 7. Restart home medications where appropriate.   Medical Decision Making Problem Points:  Established problem, worsening (2), Review of last therapy session (1) and Review of psycho-social stressors (1) Data Points:  Review or order clinical lab tests (1) Review or order medicine tests (1) Review of medication regiment & side effects (2) Review of new medications or change in dosage (2)  I certify that inpatient services furnished can reasonably be expected to improve the patient's condition.   Hilja Kintzel,JANARDHAHA R. 08/26/2013, 3:22 PM

## 2013-08-26 NOTE — Progress Notes (Signed)
Adult Psychoeducational Group Note  Date:  08/26/2013 Time:  10:00am Group Topic/Focus:  Therapeutic Activity  Participation Level:  Active  Participation Quality:  Appropriate  Affect:  Appropriate  Cognitive:  Alert and Appropriate  Insight: Appropriate  Engagement in Group:  Engaged  Modes of Intervention:  Discussion and Education  Additional Comments:  Pt attended and participated in group. Discussion was on naming a happy time in your life. Pt stated the happiest time in her life is when her kids are born and when she sings.  Shelly Bombard D 08/26/2013, 1:52 PM

## 2013-08-26 NOTE — [facility] Group Notes (Signed)
[facility] LCSW Group Therapy  Emotional Regulation 1:15 - 2: 30 PM        08/26/2013  1:13 PM   Type of Therapy:  Group Therapy  Participation Level:  Appropriate  Participation Quality:  Appropriate  Affect:  Appropriate  Cognitive:  Attentive Appropriate  Insight:  Engaged  Engagement in Therapy:  Engaged  Modes of Intervention:  Discussion Exploration Problem-Solving Supportive  Summary of Progress/Problems:  Group topic was emotional regulations.  Patient participated in the discussion and was able to identify an emotion that needed to regulated.  She shared she deals with holding things in and then exploding.  Patient was able to identify approprite coping skills.  Wynn Banker 08/26/2013
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
## 2013-08-26 NOTE — Progress Notes (Signed)
Recreation Therapy Notes  Date: 11.12.2014 Time: 3:00pm Location: [address] Dayroom    Group Topic: Communication, Team Building, Problem Solving  Goal Area(s) Addresses:  Patient will effectively work with peer towards shared goal.  Patient will identify skill used to make activity successful.  Patient will identify how skills used during activity can be used to reach post d/c goals.   Behavioral Response: Observation  Intervention: Problem Solving Activity  Activity: Landing Pad. In teams patients were given 12 plastic drinking straws and a length of masking tape. Using the materials provided patients were asked to build a landing pad to catch a golf ball dropped from approximately 6 feet in the air.   Education: Special educational needs teacher, Team Work, Building control surveyor, Journalist, newspaper.   Education Outcome: Acknowledges understanding   Clinical Observations/Feedback: Patient presented with negative attitude and self-defeating behavior during session. Patient arrived late to group session, upon arrival patient assumed role of observer. Patient refused to participate in group session, stating she did not know how to, so she did not want anything to do with activity. Patient was additionally overheard to speak negatively about staff. Before LRT could offer patient guidance and suggestions for admission patient was asked to leave session to see physician. Patient did not return.    Marykay Lex Sosha Shepherd, LRT/CTRS  Angelos Wasco L 08/26/2013 5:15 PM

## 2013-08-27 MED ORDER — MOMETASONE FURO-FORMOTEROL FUM 200-5 MCG/ACT IN AERO: 2.0000 | INHALATION_SPRAY | Freq: Two times a day (BID) | RESPIRATORY_TRACT | Status: DC

## 2013-08-27 NOTE — Progress Notes (Signed)
Patient ID: Margaret Cline, female   DOB: 12-Apr-1991, 22 y.o.   MRN: [id number]  Morning Wellness Group  9:00 a.m.   The focus of this group is to educate the patient on the purpose and policies of crisis stabilization and provide a format to answer questions about their admission.  The group details unit policies and expectations of patients while admitted.  The patient came in at the end of group but was attentive and engaged. Patient was tearful when she started talking about how much she has learned here and thankful for the other patients. Patient stated that her goal for when she discharges from [facility] is to set more boundaries and not let people, "walk all over me."

## 2013-08-27 NOTE — Progress Notes (Signed)
Patient ID: Margaret Cline, female   DOB: 1991/09/17, 22 y.o.   MRN: [id number]  Patient presents with appropriate mood and affect. Patient denies SI/HI and A/V hallucinations. Patient denies pain but states she is having slight cramps. Patient was given a hot pack and her cramps were relieved. Patient reports her depression and hopelessness at a 1/10 today. Writer gave patient encouragement and support. Writer went over discharge and medication instructions with patient. Patient verbalized understanding of discharge instructions. Patient did not have anything in a locker and signed the belongings sheet indicating this. Patient had no concerns or complaints at discharge. Patient was discharged from [facility] with no signs of distress.

## 2013-08-27 NOTE — Progress Notes (Signed)
D: Pt is appropriate in affect and mood.  She is reporting readiness to go home. She is denying any current psychosocial symptoms. Pt is denying any SI/HI/AVH. Pt attended group this evening. Pt observed interacting appropriately within the milieu. She denied the need for any medication for sleep this evening.  A: Continued support and availability as needed was extended to this pt. Staff continue to monitor pt with q87min checks.  R:  Pt receptive to treatment. Pt remains safe at this time.
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
## 2013-08-27 NOTE — Progress Notes (Signed)
[facility] Adult Case Management Discharge Plan :  Will you be returning to the same living situation after discharge: Yes,  Patient to return to her home. At discharge, do you have transportation home?:Yes,  Patient family will transport her home. Do you have the ability to pay for your medications:Yes,  Patient has Meicaid  Release of information consent forms completed and in the chart;  Patient's signature needed at discharge.  Patient to Follow up at: Follow-up Information   [facility] with [facility] On 09/01/2013. (You are scheduled with [facility] on Tuesday, September 01, 2013 at 1:00 PM)    Contact information:   [address] [place], [place]   [address]  [phone]      Follow up with [facility] On 08/31/2013. (Please go [facility] walk in clinic on Monday, August 31, 2013 or any weekday between 8AM - 3 PM for medication management)    Contact information:   [address] [place], [place]   [address]  [phone]      Patient denies SI/HI:   Patient no longer endorsing SI/HI or other thoughts of self harm.   Safety Planning and Suicide Prevention discussed: .Reviewed with all patients during discharge planning group  Thalya Fouche, [place] July 08/27/2013, 10:52 AM

## 2013-08-27 NOTE — ED Provider Notes (Signed)
CSN: [id number]     Arrival date & time 08/23/13  0154 History   First MD Initiated Contact with Patient 08/23/13 [phone]     Chief Complaint  Patient presents with  . Drug Overdose  . Medical Clearance   (Consider location/radiation/quality/duration/timing/severity/associated sxs/prior Treatment) HPI  Past Medical History  Diagnosis Date  . Asthma   . Chlamydia 2008  . Gonorrhea   . Irregular periods/menstrual cycles   . Constipation, chronic 11/13/11  . Migraines     Migraines  . Depression     postpartum  . Trichomonas 2008  . Hx: UTI (urinary tract infection)     Frequently  . Obese    Past Surgical History  Procedure Laterality Date  . Wisdom tooth extraction    . Induced abortion     Family History  Problem Relation Age of Onset  . Anesthesia problems Neg Hx   . Hypotension Neg Hx   . Malignant hyperthermia Neg Hx   . Pseudochol deficiency Neg Hx   . Hypertension Mother   . Heart disease Mother   . Diabetes Mother   . Stroke Mother   . Hyperlipidemia Mother   . Hypertension Sister   . Thrombophlebitis Sister     clotting issue  . Depression Sister   . Heart disease Maternal Grandfather   . Drug abuse Father   . Alcohol abuse Maternal Uncle   . Kidney disease Maternal Uncle     failure  . Rheum arthritis Sister     arthritis vs fibromyalgia  . Heart murmur Sister   . Fibromyalgia Sister   . Depression Sister   . Hypertension Sister    History  Substance Use Topics  . Smoking status: Never Smoker   . Smokeless tobacco: Never Used  . Alcohol Use: No     Comment: occasional   OB History   Grav Para Term Preterm Abortions TAB SAB Ect Mult Living   3 2 2  0 1 1 0 0 0 2     Review of Systems  Allergies  Review of patient's allergies indicates no known allergies.  Home Medications   Current Outpatient Rx  Name  Route  Sig  Dispense  Refill  . albuterol (PROAIR HFA) 108 (90 BASE) MCG/ACT inhaler   Inhalation   Inhale 2 puffs into the lungs  every 4 (four) hours as needed for wheezing or shortness of breath. For shortness of breath/wheezing         . albuterol (PROVENTIL) (2.5 MG/3ML) 0.083% nebulizer solution   Nebulization   Take 2.5 mg by nebulization every 6 (six) hours as needed for wheezing or shortness of breath. For shortness of breath         . mometasone-formoterol (DULERA) 200-5 MCG/ACT AERO   Inhalation   Inhale 2 puffs into the lungs 2 (two) times daily. For asthma.   1 Inhaler   0    BP 102/71  Pulse 80  Temp(Src) 97.8 F (36.6 C) (Oral)  Resp 20  Wt 243 lb 1.6 oz (110.269 kg)  SpO2 99% Physical Exam  ED Course  Procedures (including critical care time) Labs Review Labs Reviewed  CBC WITH DIFFERENTIAL - Abnormal; Notable for the following:    WBC 10.6 (*)    All other components within normal limits  URINE RAPID DRUG SCREEN (HOSP PERFORMED) - Abnormal; Notable for the following:    Tetrahydrocannabinol POSITIVE (*)    All other components within normal limits  SALICYLATE LEVEL - Abnormal; Notable  for the following:    Salicylate Lvl <2.0 (*)    All other components within normal limits  POCT I-STAT, CHEM 8 - Abnormal; Notable for the following:    Hemoglobin 15.3 (*)    All other components within normal limits  ACETAMINOPHEN LEVEL  ETHANOL  POCT PREGNANCY, URINE   Imaging Review No results found.  EKG Interpretation     Ventricular Rate:  72 PR Interval:  139 QRS Duration: 87 QT Interval:  376 QTC Calculation: 411 R Axis:   70 Text Interpretation:  Sinus rhythm            MDM   1. Major depression, recurrent         Arman Filter, NP 08/27/13 2013  Arman Filter, NP 08/27/13 2013

## 2013-08-27 NOTE — Discharge Summary (Addendum)
Physician Discharge Summary Note  Patient:  Margaret Cline is an 22 y.o., female MRN:  [id number] DOB:  11/15/1990 Patient phone:  [phone] (home)  Patient address:   [address] [place] [address],   Date of Admission:  08/24/2013 Date of Discharge: 08/27/2013  Reason for Admission:  Suicidal ideation  Discharge Diagnoses: Active Problems:   * No active hospital problems. *  ROS  DSM5: Schizophrenia Disorders:  Obsessive-Compulsive Disorders:  Trauma-Stressor Disorders:  Substance/Addictive Disorders: Cannabis Use Disorder - Moderate 9304.30)  Depressive Disorders: Disruptive Mood Dysregulation Disorder (296.99)  AXIS I: Major Depression, single episode  AXIS II: Cluster B Traits  AXIS III:  Past Medical History   Diagnosis  Date   .  Asthma    .  Chlamydia  2008   .  Gonorrhea    .  Irregular periods/menstrual cycles    .  Constipation, chronic  11/13/11   .  Migraines      Migraines   .  Depression      postpartum   .  Trichomonas  2008   .  Hx: UTI (urinary tract infection)      Frequently   .  Obese     AXIS IV: problems with primary support group  AXIS V: 51-60 moderate symptoms   Level of Care:  OP  Hospital Course:  Margaret Cline is a 22 year old AAF who presented to the [facility] brought in by police because her mother thought that she was suicidal stating she had seen a post on Facebook stating that Margaret Cline was going to take a "handful of pills". Her BF is reported as having seen her take a handful of pills which Margaret Cline stated was cold medicine.      Upon arrival at the ED Margaret Cline was lethargic and slurring her words upon her assessment. She was transferred to [facility] for further evaluation and stabilization.      She was evaluated upon arrival at the unit and her story is vastly different that what was documented in the ED. She denies any FB posts, denied being suicidal, homicidal or even depressed. She stated that her mother had overreacted because she  did not answer her phone at 1AM because she was sleeping.       Margaret Cline reported that she was stressed being a full time employee, going to school and raising her two children, but vehemently denied being suicidal. She was reluctant to take medication stating that she would rather cope in other ways.       She was encouraged to participate in unit programming to learn coping skills, problem solving and to share in a therapeutic and supportive environment.        The CM did contact her family that supported Margaret Cline's account of the situation and who felt that she was safe to discharge home. Margaret Cline was worried about her children, returning to work and school. It was felt that she was safe to discharge home and she was given information for follow up care if she chose to pursue it. She denied SI/HI or AVH and was discharged home. Consults:  None  Significant Diagnostic Studies:  labs: UDS, UA, CMP, CBC, BAL  Discharge Vitals:   Blood pressure 103/70, pulse 106, temperature 98.1 F (36.7 C), temperature source Oral, resp. rate 16, height 5' 6.25" (1.683 m), weight 109.77 kg (242 lb), last menstrual period 06/24/2012, not currently breastfeeding. Body mass index is 38.75 kg/(m^2). Lab Results:   No  results found for this or any previous visit (from the past 72 hour(s)).  Physical Findings: AIMS: Facial and Oral Movements Muscles of Facial Expression: None, normal Lips and Perioral Area: None, normal Jaw: None, normal Tongue: None, normal,Extremity Movements Upper (arms, wrists, hands, fingers): None, normal Lower (legs, knees, ankles, toes): None, normal, Trunk Movements Neck, shoulders, hips: None, normal, Overall Severity Severity of abnormal movements (highest score from questions above): None, normal Incapacitation due to abnormal movements: None, normal Patient's awareness of abnormal movements (rate only patient's report): No Awareness, Dental Status Current problems with teeth and/or  dentures?: No Does patient usually wear dentures?: No  CIWA:  CIWA-Ar Total: 1 COWS:  COWS Total Score: 2  Psychiatric Specialty Exam: See Psychiatric Specialty Exam and Suicide Risk Assessment completed by Attending Physician prior to discharge.  Discharge destination:  Home  Is patient on multiple antipsychotic therapies at discharge:  No   Has Patient had three or more failed trials of antipsychotic monotherapy by history:  No  Recommended Plan for Multiple Antipsychotic Therapies: NA     Medication List       Indication   mometasone-formoterol 200-5 MCG/ACT Aero  Commonly known as:  DULERA  Inhale 2 puffs into the lungs 2 (two) times daily. For asthma.   Indication:  Asthma     PROAIR HFA 108 (90 BASE) MCG/ACT inhaler  Generic drug:  albuterol  Inhale 2 puffs into the lungs every 4 (four) hours as needed for wheezing or shortness of breath. For shortness of breath/wheezing      albuterol (2.5 MG/3ML) 0.083% nebulizer solution  Commonly known as:  PROVENTIL  Take 2.5 mg by nebulization every 6 (six) hours as needed for wheezing or shortness of breath. For shortness of breath            Follow-up Information   [facility] with [facility] On 09/01/2013. (You are scheduled with [facility] on Tuesday, September 01, 2013 at 1:00 PM)    Contact information:   [address] [place], [place]   [address]  [phone]      Follow up with [facility] On 08/31/2013. (Please go [facility] on Monday, August 31, 2013 or any weekday between 8AM - 3 PM for medication management)    Contact information:   [address] [place], [place]   [address]  [phone]      Follow-up recommendations:   Activities: Resume activity as tolerated. Diet: Heart healthy low sodium diet Tests: Follow up testing will be determined by your out patient provider. Comments:    Total Discharge Time:  Less than 30  minutes.  Signed: MASHBURN,NEIL 08/27/2013, 11:18 AM  Patient is seen and personally for psychiatric evaluation, suicide risk assessment, case discussed with physician extender and made discharge treatment plan. Reviewed the information documented and agree with the treatment plan.  Azara Gemme,JANARDHAHA R. 08/31/2013 9:01 AM

## 2013-08-28 NOTE — ED Provider Notes (Signed)
Medical screening examination/treatment/procedure(s) were performed by non-physician practitioner and as supervising physician I was immediately available for consultation/collaboration.  EKG Interpretation     Ventricular Rate:  72 PR Interval:  139 QRS Duration: 87 QT Interval:  376 QTC Calculation: 411 R Axis:   70 Text Interpretation:  Sinus rhythm             Shirle Provencal K Samay Delcarlo-Rasch, MD 08/28/13 [phone]

## 2013-08-28 NOTE — Progress Notes (Signed)
Pt was seen with NP. Agree with assessment and plan.  Tonea Leiphart, MD 

## 2013-09-01 NOTE — Progress Notes (Signed)
Patient Discharge Instructions:  After Visit Summary (AVS):   Faxed to:  09/01/13 Discharge Summary Note:   Faxed to:  09/01/13 Psychiatric Admission Assessment Note:   Faxed to:  09/01/13 Suicide Risk Assessment - Discharge Assessment:   Faxed to:  09/01/13 Faxed/Sent to the Next Level Care provider:  09/01/13 Faxed to [facility] @ [phone] Faxed to [facility] @ [phone]  Jerelene Redden, 09/01/2013, 2:03 PM

## 2013-09-05 ENCOUNTER — Emergency Department (HOSPITAL_COMMUNITY): Payer: Medicaid Other

## 2013-09-05 ENCOUNTER — Emergency Department (HOSPITAL_COMMUNITY)
Admission: EM | Admit: 2013-09-05 | Discharge: 2013-09-06 | Disposition: A | Discharge status: Home / Self Care | Payer: Medicaid Other | Attending: Emergency Medicine | Admitting: Emergency Medicine

## 2013-09-05 ENCOUNTER — Encounter (HOSPITAL_COMMUNITY): Payer: Self-pay | Admitting: Emergency Medicine

## 2013-09-05 DIAGNOSIS — Z79899 Other long term (current) drug therapy: Secondary | ICD-10-CM | POA: Insufficient documentation

## 2013-09-05 DIAGNOSIS — IMO0002 Reserved for concepts with insufficient information to code with codable children: Secondary | ICD-10-CM | POA: Insufficient documentation

## 2013-09-05 DIAGNOSIS — Z87440 Personal history of urinary (tract) infections: Secondary | ICD-10-CM | POA: Insufficient documentation

## 2013-09-05 DIAGNOSIS — Z8659 Personal history of other mental and behavioral disorders: Secondary | ICD-10-CM | POA: Insufficient documentation

## 2013-09-05 DIAGNOSIS — R110 Nausea: Secondary | ICD-10-CM | POA: Insufficient documentation

## 2013-09-05 DIAGNOSIS — Z8619 Personal history of other infectious and parasitic diseases: Secondary | ICD-10-CM | POA: Insufficient documentation

## 2013-09-05 DIAGNOSIS — R1013 Epigastric pain: Secondary | ICD-10-CM

## 2013-09-05 DIAGNOSIS — Z8742 Personal history of other diseases of the female genital tract: Secondary | ICD-10-CM | POA: Insufficient documentation

## 2013-09-05 DIAGNOSIS — Z8719 Personal history of other diseases of the digestive system: Secondary | ICD-10-CM | POA: Insufficient documentation

## 2013-09-05 DIAGNOSIS — M549 Dorsalgia, unspecified: Secondary | ICD-10-CM | POA: Insufficient documentation

## 2013-09-05 DIAGNOSIS — Z3202 Encounter for pregnancy test, result negative: Secondary | ICD-10-CM | POA: Insufficient documentation

## 2013-09-05 DIAGNOSIS — E669 Obesity, unspecified: Secondary | ICD-10-CM | POA: Insufficient documentation

## 2013-09-05 DIAGNOSIS — Z8679 Personal history of other diseases of the circulatory system: Secondary | ICD-10-CM | POA: Insufficient documentation

## 2013-09-05 DIAGNOSIS — J45909 Unspecified asthma, uncomplicated: Secondary | ICD-10-CM | POA: Insufficient documentation

## 2013-09-05 LAB — URINALYSIS, ROUTINE W REFLEX MICROSCOPIC
Bilirubin Urine: NEGATIVE
Glucose, UA: NEGATIVE mg/dL
Hgb urine dipstick: NEGATIVE
Ketones, ur: NEGATIVE mg/dL
Specific Gravity, Urine: 1.038 — ABNORMAL HIGH (ref 1.005–1.030)
Urobilinogen, UA: 0.2 mg/dL (ref 0.0–1.0)

## 2013-09-05 LAB — CBC WITH DIFFERENTIAL/PLATELET
Basophils Absolute: 0 10*3/uL (ref 0.0–0.1)
Basophils Relative: 0 % (ref 0–1)
Eosinophils Relative: 1 % (ref 0–5)
Lymphocytes Relative: 35 % (ref 12–46)
Neutro Abs: 5.8 10*3/uL (ref 1.7–7.7)
Neutrophils Relative %: 59 % (ref 43–77)
Platelets: 318 10*3/uL (ref 150–400)
RDW: 14.4 % (ref 11.5–15.5)
WBC: 9.9 10*3/uL (ref 4.0–10.5)

## 2013-09-05 LAB — COMPREHENSIVE METABOLIC PANEL
ALT: 51 U/L — ABNORMAL HIGH (ref 0–35)
AST: 29 U/L (ref 0–37)
Albumin: 3.7 g/dL (ref 3.5–5.2)
Alkaline Phosphatase: 79 U/L (ref 39–117)
CO2: 27 mEq/L (ref 19–32)
Calcium: 9.4 mg/dL (ref 8.4–10.5)
Chloride: 102 mEq/L (ref 96–112)
GFR calc Af Amer: >90 mL/min (ref >90)
GFR calc non Af Amer: >90 mL/min (ref >90)
Glucose, Bld: 86 mg/dL (ref 70–99)
Potassium: 4.2 mEq/L (ref 3.5–5.1)
Sodium: 139 mEq/L (ref 135–145)
Total Bilirubin: 0.4 mg/dL (ref 0.3–1.2)

## 2013-09-05 LAB — LIPASE, BLOOD: Lipase: 23 U/L (ref 11–59)

## 2013-09-05 LAB — ACETAMINOPHEN LEVEL: Acetaminophen (Tylenol), Serum: <15 ug/mL (ref 10–30)

## 2013-09-05 LAB — POCT PREGNANCY, URINE: Preg Test, Ur: NEGATIVE

## 2013-09-05 MED ORDER — SODIUM CHLORIDE 0.9 % IV BOLUS (SEPSIS)
1000.0000 mL | Freq: Once | INTRAVENOUS | Status: AC
  Administered 2013-09-05: 1000 mL via INTRAVENOUS

## 2013-09-05 MED ORDER — ONDANSETRON HCL 4 MG/2ML IJ SOLN
4.0000 mg | INTRAMUSCULAR | Status: AC
  Administered 2013-09-05: 4 mg via INTRAVENOUS
  Filled 2013-09-05: qty 2

## 2013-09-05 MED ORDER — MORPHINE SULFATE 4 MG/ML IJ SOLN
4.0000 mg | Freq: Once | INTRAMUSCULAR | Status: AC
  Administered 2013-09-05: 4 mg via INTRAVENOUS
  Filled 2013-09-05: qty 1

## 2013-09-05 NOTE — ED Notes (Signed)
Patient presents with c/o abd pain that radiates through to her back.  BM's have been loose, brown

## 2013-09-05 NOTE — ED Notes (Signed)
U/S tech called, she is at [facility] and will be there at least until 11:30p, then she will come to [facility].

## 2013-09-05 NOTE — ED Notes (Signed)
Was seen last week at [facility] for asthma.  Mother called and told them that she was having panic attacks.

## 2013-09-05 NOTE — ED Provider Notes (Signed)
CSN: [id number]     Arrival date & time 09/05/13  1923 History   First MD Initiated Contact with Patient 09/05/13 2035     Chief Complaint  Patient presents with  . Abdominal Pain  . Back Pain   (Consider location/radiation/quality/duration/timing/severity/associated sxs/prior Treatment) HPI Comments: Patient is a 22 year old female who presents for abdominal pain x4 days. Patient states the symptoms have been constant since onset and radiating to her back. Pain is sharp in nature and waxing and waning in severity and worse with deep breathing and eating or drinking. Patient denies any alleviating factors of her symptoms. She states she has had associated loose brown stool for the past 2 days as well and that she has felt nauseous at times. Patient denies associated fever, chest pain, vomiting, urinary symptoms, vaginal complaints, melena or hematochezia, numbness or tingling, and weakness. Patient denies a history of abdominal surgeries.  The history is provided by the patient. No language interpreter was used.    Past Medical History  Diagnosis Date  . Asthma   . Chlamydia 2008  . Gonorrhea   . Irregular periods/menstrual cycles   . Constipation, chronic 11/13/11  . Migraines     Migraines  . Depression     postpartum  . Trichomonas 2008  . Hx: UTI (urinary tract infection)     Frequently  . Obese    Past Surgical History  Procedure Laterality Date  . Wisdom tooth extraction    . Induced abortion     Family History  Problem Relation Age of Onset  . Anesthesia problems Neg Hx   . Hypotension Neg Hx   . Malignant hyperthermia Neg Hx   . Pseudochol deficiency Neg Hx   . Hypertension Mother   . Heart disease Mother   . Diabetes Mother   . Stroke Mother   . Hyperlipidemia Mother   . Hypertension Sister   . Thrombophlebitis Sister     clotting issue  . Depression Sister   . Heart disease Maternal Grandfather   . Drug abuse Father   . Alcohol abuse Maternal Uncle   .  Kidney disease Maternal Uncle     failure  . Rheum arthritis Sister     arthritis vs fibromyalgia  . Heart murmur Sister   . Fibromyalgia Sister   . Depression Sister   . Hypertension Sister    History  Substance Use Topics  . Smoking status: Never Smoker   . Smokeless tobacco: Never Used  . Alcohol Use: Yes     Comment: occasional   OB History   Grav Para Term Preterm Abortions TAB SAB Ect Mult Living   3 2 2  0 1 1 0 0 0 2     Review of Systems  Constitutional: Negative for fever.  Cardiovascular: Negative for chest pain.  Gastrointestinal: Positive for nausea and abdominal pain. Negative for vomiting.  Genitourinary: Negative for dysuria, hematuria, vaginal bleeding and vaginal discharge.  Neurological: Negative for weakness and numbness.  All other systems reviewed and are negative.    Allergies  Review of patient's allergies indicates no known allergies.  Home Medications   Current Outpatient Rx  Name  Route  Sig  Dispense  Refill  . albuterol (PROAIR HFA) 108 (90 BASE) MCG/ACT inhaler   Inhalation   Inhale 2 puffs into the lungs every 4 (four) hours as needed for wheezing or shortness of breath. For shortness of breath/wheezing         . albuterol (PROVENTIL) (2.5 MG/3ML)  0.083% nebulizer solution   Nebulization   Take 2.5 mg by nebulization every 6 (six) hours as needed for wheezing or shortness of breath. For shortness of breath         . mometasone-formoterol (DULERA) 200-5 MCG/ACT AERO   Inhalation   Inhale 2 puffs into the lungs 2 (two) times daily. For asthma.   1 Inhaler   0   . oxyCODONE-acetaminophen (PERCOCET/ROXICET) 5-325 MG per tablet   Oral   Take 1 tablet by mouth every 6 (six) hours as needed for severe pain.   7 tablet   0    BP 120/81  Pulse 79  Temp(Src) 98.1 F (36.7 C) (Oral)  Resp 16  Wt 244 lb 11.2 oz (110.995 kg)  SpO2 100%  LMP 06/24/2012  Physical Exam  Nursing note and vitals reviewed. Constitutional: She is  oriented to person, place, and time. She appears well-developed and well-nourished. No distress.  HENT:  Head: Normocephalic and atraumatic.  Mouth/Throat: Oropharynx is clear and moist. No oropharyngeal exudate.  Eyes: Conjunctivae and EOM are normal. Pupils are equal, round, and reactive to light. No scleral icterus.  Neck: Normal range of motion.  Cardiovascular: Normal rate, regular rhythm and normal heart sounds.   Pulmonary/Chest: Effort normal and breath sounds normal. No respiratory distress. She has no wheezes. She has no rales.  No dyspnea, tachypnea, or accessory muscle use  Abdominal: She exhibits no mass. There is tenderness. There is no rebound and no guarding.  Focal TTP in epigastric region and mildly in RUQ. Patient endorses that palpation over rest of abdomen causes referred pain to her epigastric region. No peritoneal signs or involuntary guarding.  Musculoskeletal: Normal range of motion.  Neurological: She is alert and oriented to person, place, and time.  Skin: Skin is warm and dry. No rash noted. She is not diaphoretic. No erythema. No pallor.  Psychiatric: She has a normal mood and affect. Her behavior is normal.    ED Course  Procedures (including critical care time) Labs Review Labs Reviewed  COMPREHENSIVE METABOLIC PANEL - Abnormal; Notable for the following:    ALT 51 (*)    All other components within normal limits  URINALYSIS, ROUTINE W REFLEX MICROSCOPIC - Abnormal; Notable for the following:    APPearance HAZY (*)    Specific Gravity, Urine 1.038 (*)    All other components within normal limits  LIPASE, BLOOD  CBC WITH DIFFERENTIAL  ACETAMINOPHEN LEVEL  POCT PREGNANCY, URINE   Imaging Review Dg Chest 2 View  09/05/2013   CLINICAL DATA:  Epigastric abdomen pain  EXAM: CHEST  2 VIEW  COMPARISON:  November 06, 2012  FINDINGS: The heart size and mediastinal contours are within normal limits. Both lungs are clear. There is no focal infiltrate, pulmonary  edema, or pleural effusion. The visualized skeletal structures are stable.  IMPRESSION: No active cardiopulmonary disease.   Electronically Signed   By: Sherian Rein M.D.   On: 09/05/2013 22:52   US Abdomen Complete  09/06/2013   CLINICAL DATA:  Abdomen pain.  EXAM: ULTRASOUND ABDOMEN COMPLETE  COMPARISON:  None.  FINDINGS: Gallbladder  No gallstones or wall thickening visualized. No sonographic Murphy sign noted.  Common bile duct  Diameter: 2.4 mm  Liver  No focal lesion identified. Within normal limits in parenchymal echogenicity.  IVC  No abnormality visualized.  Pancreas  Visualized portion unremarkable.  Spleen  Size and appearance within normal limits.  Right Kidney  Length: 11.5 cm. Echogenicity within normal limits.  No mass or hydronephrosis visualized.  Left Kidney  Length: 11.6 cm. Echogenicity within normal limits. No mass or hydronephrosis visualized.  Abdominal aorta  No aneurysm visualized.  IMPRESSION: No acute abnormality.   Electronically Signed   By: Sherian Rein M.D.   On: 09/06/2013 00:56    EKG Interpretation   None       MDM   1. Epigastric pain    Patient is a 22 year old female who presents for epigastric pain radiating through to her back x4 days. Patient well and nontoxic appearing, hemodynamically stable, and afebrile on initial presentation. Physical exam significant for moderate focal tenderness to the epigastrium on deep palpation without peritoneal signs or guarding. Patient also mildly tender in the right upper quadrant. Patient without leukocytosis or electrolyte imbalance today. She has a mild elevation of her ALT; labs otherwise unremarkable. Will further evaluate symptoms with abdominal ultrasound and chest x-ray. IV fluids, morphine, and Zofran ordered for symptomatic management.  Chest x-ray negative for acute cardiopulmonary process and abdominal ultrasound with no findings to explain patient's abdominal pain. Patient states the pain has been  well-controlled with morphine over course of her ED stay. Given her improvement in symptoms, reassuring workup, and hemodynamic stability, do not believe further workup is indicated at this time. Patient stable and appropriate for discharge with gastroenterology followup; referral provided. Percocet prescribed for pain control if needed. Return precautions discussed and patient agreeable to plan with no unaddressed concerns.   Filed Vitals:   09/05/13 2315 09/05/13 2345 09/06/13 0100 09/06/13 0130  BP: 113/75 120/81 121/85 112/73  Pulse: 80 79 83 79  Temp:      TempSrc:      Resp:      Weight:      SpO2: 100% 100% 100% 100%      Antony Madura, PA-C 09/07/13 0815

## 2013-09-06 MED ORDER — OXYCODONE-ACETAMINOPHEN 5-325 MG PO TABS: 1.0000 | ORAL_TABLET | Freq: Four times a day (QID) | ORAL | Status: DC | PRN

## 2013-09-06 MED ORDER — OXYCODONE-ACETAMINOPHEN 5-325 MG PO TABS
2.0000 | ORAL_TABLET | Freq: Once | ORAL | Status: AC
  Administered 2013-09-06: 2 via ORAL
  Filled 2013-09-06: qty 2

## 2013-09-06 NOTE — ED Notes (Signed)
Pt taken to US

## 2013-09-06 NOTE — ED Notes (Signed)
Pt back from [facility]

## 2013-09-08 NOTE — ED Provider Notes (Signed)
Medical screening examination/treatment/procedure(s) were performed by non-physician practitioner and as supervising physician I was immediately available for consultation/collaboration.  EKG Interpretation   None         Joya Gaskins, MD 09/08/13 1531

## 2013-09-12 ENCOUNTER — Encounter (HOSPITAL_COMMUNITY): Payer: Self-pay | Admitting: Emergency Medicine

## 2013-09-12 ENCOUNTER — Emergency Department (HOSPITAL_COMMUNITY)
Admission: EM | Admit: 2013-09-12 | Discharge: 2013-09-12 | Disposition: A | Discharge status: Home / Self Care | Payer: Medicaid Other | Attending: Emergency Medicine | Admitting: Emergency Medicine

## 2013-09-12 ENCOUNTER — Emergency Department (HOSPITAL_COMMUNITY): Payer: Medicaid Other

## 2013-09-12 DIAGNOSIS — R112 Nausea with vomiting, unspecified: Secondary | ICD-10-CM | POA: Insufficient documentation

## 2013-09-12 DIAGNOSIS — Z3202 Encounter for pregnancy test, result negative: Secondary | ICD-10-CM | POA: Insufficient documentation

## 2013-09-12 DIAGNOSIS — K625 Hemorrhage of anus and rectum: Secondary | ICD-10-CM | POA: Insufficient documentation

## 2013-09-12 DIAGNOSIS — G43909 Migraine, unspecified, not intractable, without status migrainosus: Secondary | ICD-10-CM | POA: Insufficient documentation

## 2013-09-12 DIAGNOSIS — Z87440 Personal history of urinary (tract) infections: Secondary | ICD-10-CM | POA: Insufficient documentation

## 2013-09-12 DIAGNOSIS — J45909 Unspecified asthma, uncomplicated: Secondary | ICD-10-CM | POA: Insufficient documentation

## 2013-09-12 DIAGNOSIS — R1013 Epigastric pain: Secondary | ICD-10-CM | POA: Insufficient documentation

## 2013-09-12 DIAGNOSIS — Z8659 Personal history of other mental and behavioral disorders: Secondary | ICD-10-CM | POA: Insufficient documentation

## 2013-09-12 DIAGNOSIS — E669 Obesity, unspecified: Secondary | ICD-10-CM | POA: Insufficient documentation

## 2013-09-12 DIAGNOSIS — Z79899 Other long term (current) drug therapy: Secondary | ICD-10-CM | POA: Insufficient documentation

## 2013-09-12 DIAGNOSIS — Z8742 Personal history of other diseases of the female genital tract: Secondary | ICD-10-CM | POA: Insufficient documentation

## 2013-09-12 DIAGNOSIS — Z8619 Personal history of other infectious and parasitic diseases: Secondary | ICD-10-CM | POA: Insufficient documentation

## 2013-09-12 DIAGNOSIS — R109 Unspecified abdominal pain: Secondary | ICD-10-CM

## 2013-09-12 DIAGNOSIS — R197 Diarrhea, unspecified: Secondary | ICD-10-CM | POA: Insufficient documentation

## 2013-09-12 DIAGNOSIS — K920 Hematemesis: Secondary | ICD-10-CM | POA: Insufficient documentation

## 2013-09-12 DIAGNOSIS — K921 Melena: Secondary | ICD-10-CM | POA: Insufficient documentation

## 2013-09-12 DIAGNOSIS — R1110 Vomiting, unspecified: Secondary | ICD-10-CM

## 2013-09-12 DIAGNOSIS — Z791 Long term (current) use of non-steroidal anti-inflammatories (NSAID): Secondary | ICD-10-CM | POA: Insufficient documentation

## 2013-09-12 LAB — COMPREHENSIVE METABOLIC PANEL
AST: 16 U/L (ref 0–37)
Albumin: 3.6 g/dL (ref 3.5–5.2)
Alkaline Phosphatase: 74 U/L (ref 39–117)
Calcium: 9.5 mg/dL (ref 8.4–10.5)
Chloride: 102 mEq/L (ref 96–112)
Creatinine, Ser: 0.74 mg/dL (ref 0.50–1.10)
Potassium: 3.6 mEq/L (ref 3.5–5.1)
Sodium: 140 mEq/L (ref 135–145)

## 2013-09-12 LAB — CBC WITH DIFFERENTIAL/PLATELET
Basophils Absolute: 0 10*3/uL (ref 0.0–0.1)
Basophils Relative: 0 % (ref 0–1)
Eosinophils Relative: 2 % (ref 0–5)
HCT: 41.6 % (ref 36.0–46.0)
MCHC: 33.4 g/dL (ref 30.0–36.0)
Monocytes Absolute: 0.4 10*3/uL (ref 0.1–1.0)
Monocytes Relative: 4 % (ref 3–12)
Neutro Abs: 7.2 10*3/uL (ref 1.7–7.7)
Platelets: 346 10*3/uL (ref 150–400)
RDW: 14.3 % (ref 11.5–15.5)
WBC: 10.6 10*3/uL — ABNORMAL HIGH (ref 4.0–10.5)

## 2013-09-12 LAB — URINALYSIS, ROUTINE W REFLEX MICROSCOPIC
Glucose, UA: NEGATIVE mg/dL
Ketones, ur: 15 mg/dL — AB
Leukocytes, UA: NEGATIVE
Specific Gravity, Urine: 1.036 — ABNORMAL HIGH (ref 1.005–1.030)
Urobilinogen, UA: 0.2 mg/dL (ref 0.0–1.0)
pH: 5.5 (ref 5.0–8.0)

## 2013-09-12 LAB — POCT PREGNANCY, URINE: Preg Test, Ur: NEGATIVE

## 2013-09-12 MED ORDER — IOHEXOL 300 MG/ML  SOLN
100.0000 mL | Freq: Once | INTRAMUSCULAR | Status: AC | PRN
  Administered 2013-09-12: 100 mL via INTRAVENOUS

## 2013-09-12 MED ORDER — ONDANSETRON 8 MG PO TBDP: ORAL_TABLET | ORAL | Status: DC

## 2013-09-12 MED ORDER — PANTOPRAZOLE SODIUM 40 MG IV SOLR
40.0000 mg | Freq: Once | INTRAVENOUS | Status: AC
  Administered 2013-09-12: 40 mg via INTRAVENOUS
  Filled 2013-09-12: qty 40

## 2013-09-12 MED ORDER — HYDROMORPHONE HCL PF 1 MG/ML IJ SOLN
1.0000 mg | Freq: Once | INTRAMUSCULAR | Status: AC
  Administered 2013-09-12: 1 mg via INTRAVENOUS
  Filled 2013-09-12: qty 1

## 2013-09-12 MED ORDER — SODIUM CHLORIDE 0.9 % IV BOLUS (SEPSIS)
2000.0000 mL | Freq: Once | INTRAVENOUS | Status: AC
  Administered 2013-09-12: 2000 mL via INTRAVENOUS

## 2013-09-12 MED ORDER — SODIUM CHLORIDE 0.9 % IV SOLN: INTRAVENOUS | Status: DC

## 2013-09-12 MED ORDER — ONDANSETRON HCL 4 MG/2ML IJ SOLN
4.0000 mg | Freq: Once | INTRAMUSCULAR | Status: AC
  Administered 2013-09-12: 4 mg via INTRAVENOUS
  Filled 2013-09-12: qty 2

## 2013-09-12 MED ORDER — METOCLOPRAMIDE HCL 10 MG PO TABS: 10.0000 mg | ORAL_TABLET | Freq: Four times a day (QID) | ORAL | Status: DC | PRN

## 2013-09-12 MED ORDER — FAMOTIDINE IN NACL 20-0.9 MG/50ML-% IV SOLN
20.0000 mg | Freq: Once | INTRAVENOUS | Status: AC
  Administered 2013-09-12: 20 mg via INTRAVENOUS
  Filled 2013-09-12: qty 50

## 2013-09-12 MED ORDER — OMEPRAZOLE 20 MG PO CPDR: 20.0000 mg | DELAYED_RELEASE_CAPSULE | Freq: Every day | ORAL | Status: DC

## 2013-09-12 MED ORDER — IOHEXOL 300 MG/ML  SOLN
25.0000 mL | Freq: Once | INTRAMUSCULAR | Status: AC | PRN
  Administered 2013-09-12: 25 mL via ORAL

## 2013-09-12 NOTE — ED Provider Notes (Signed)
CSN: [id number]     Arrival date & time 09/12/13  1020 History   First MD Initiated Contact with Patient 09/12/13 1040     Chief Complaint  Patient presents with  . Abdominal Pain  . Back Pain  . Hematemesis  . Rectal Bleeding   (Consider location/radiation/quality/duration/timing/severity/associated sxs/prior Treatment) HPI This 22 year old female has 2 weeks of epigastric pain 24 hours a day worse with palpation worse with edition changes nausea for 2 weeks a few days of vomiting with small amount of bright red blood per vomiting around her emesis today, she has had several days of loose stools in the last few days has a small amount of bright red blood around her loose stools as well, she is no fever no chest pain or shortness breath no vaginal bleeding no vaginal discharge no dysuria, there is no treatment prior to arrival. She had an unremarkable abdominal ultrasound within the last couple weeks for the same symptoms but that hasn't had nausea without vomiting but did have some loose stools. Past Medical History  Diagnosis Date  . Asthma   . Chlamydia 2008  . Gonorrhea   . Irregular periods/menstrual cycles   . Constipation, chronic 11/13/11  . Migraines     Migraines  . Depression     postpartum  . Trichomonas 2008  . Hx: UTI (urinary tract infection)     Frequently  . Obese    Past Surgical History  Procedure Laterality Date  . Wisdom tooth extraction    . Induced abortion     Family History  Problem Relation Age of Onset  . Anesthesia problems Neg Hx   . Hypotension Neg Hx   . Malignant hyperthermia Neg Hx   . Pseudochol deficiency Neg Hx   . Hypertension Mother   . Heart disease Mother   . Diabetes Mother   . Stroke Mother   . Hyperlipidemia Mother   . Hypertension Sister   . Thrombophlebitis Sister     clotting issue  . Depression Sister   . Heart disease Maternal Grandfather   . Drug abuse Father   . Alcohol abuse Maternal Uncle   . Kidney disease  Maternal Uncle     failure  . Rheum arthritis Sister     arthritis vs fibromyalgia  . Heart murmur Sister   . Fibromyalgia Sister   . Depression Sister   . Hypertension Sister    History  Substance Use Topics  . Smoking status: Never Smoker   . Smokeless tobacco: Never Used  . Alcohol Use: Yes     Comment: occasional   OB History   Grav Para Term Preterm Abortions TAB SAB Ect Mult Living   3 2 2  0 1 1 0 0 0 2     Review of Systems 10 Systems reviewed and are negative for acute change except as noted in the HPI. Allergies  Review of patient's allergies indicates no known allergies.  Home Medications   Current Outpatient Rx  Name  Route  Sig  Dispense  Refill  . albuterol (PROAIR HFA) 108 (90 BASE) MCG/ACT inhaler   Inhalation   Inhale 2 puffs into the lungs every 4 (four) hours as needed for wheezing or shortness of breath. For shortness of breath/wheezing         . albuterol (PROVENTIL) (2.5 MG/3ML) 0.083% nebulizer solution   Nebulization   Take 2.5 mg by nebulization every 6 (six) hours as needed for wheezing or shortness of breath. For shortness of  breath         . bismuth subsalicylate (PEPTO BISMOL) 262 MG chewable tablet   Oral   Chew 524 mg by mouth daily as needed for diarrhea or loose stools.         Marland Kitchen HYDROcodone-acetaminophen (LORTAB) 7.5-500 MG per tablet   Oral   Take 1 tablet by mouth every 6 (six) hours as needed for pain.         . mometasone-formoterol (DULERA) 200-5 MCG/ACT AERO   Inhalation   Inhale 2 puffs into the lungs 2 (two) times daily. For asthma.   1 Inhaler   0   . naproxen sodium (ANAPROX) 220 MG tablet   Oral   Take 440 mg by mouth 2 (two) times daily with a meal.         . EXPIRED: metoCLOPramide (REGLAN) 10 MG tablet   Oral   Take 1 tablet (10 mg total) by mouth every 6 (six) hours as needed (nausea/headache).   6 tablet   0   . metoCLOPramide (REGLAN) 10 MG tablet   Oral   Take 1 tablet (10 mg total) by mouth  every 6 (six) hours as needed for nausea (nausea/headache).   6 tablet   0   . omeprazole (PRILOSEC) 20 MG capsule   Oral   Take 1 capsule (20 mg total) by mouth daily.   5 capsule   0   . ondansetron (ZOFRAN ODT) 8 MG disintegrating tablet      8mg  ODT q4 hours prn nausea   4 tablet   0   . oxyCODONE-acetaminophen (PERCOCET/ROXICET) 5-325 MG per tablet   Oral   Take 1 tablet by mouth every 6 (six) hours as needed for severe pain.   7 tablet   0    BP 96/65  Pulse 75  Temp(Src) 98.2 F (36.8 C) (Oral)  Resp 19  SpO2 98%  LMP 06/24/2012 Physical Exam  Nursing note and vitals reviewed. Constitutional:  Awake, alert, nontoxic appearance.  HENT:  Head: Atraumatic.  Eyes: Right eye exhibits no discharge. Left eye exhibits no discharge.  Neck: Neck supple.  Cardiovascular: Normal rate and regular rhythm.   No murmur heard. Pulmonary/Chest: Effort normal and breath sounds normal. No respiratory distress. She has no wheezes. She has no rales. She exhibits no tenderness.  Abdominal: Soft. Bowel sounds are normal. She exhibits no distension and no mass. There is tenderness. There is no rebound and no guarding.  Mild epigastric tenderness only the rest of the abdomen is nontender without rebound  Musculoskeletal: She exhibits no tenderness.  Baseline ROM, no obvious new focal weakness.  Neurological:  Mental status and motor strength appears baseline for patient and situation.  Skin: No rash noted.  Psychiatric: She has a normal mood and affect.    ED Course  Procedures (including critical care time) Pt stable in ED with no significant deterioration in condition.Patient informed of clinical course, understand medical decision-making process, and agree with plan. Labs Review Labs Reviewed  CBC WITH DIFFERENTIAL - Abnormal; Notable for the following:    WBC 10.6 (*)    All other components within normal limits  URINALYSIS, ROUTINE W REFLEX MICROSCOPIC - Abnormal; Notable  for the following:    APPearance CLOUDY (*)    Specific Gravity, Urine 1.036 (*)    Ketones, ur 15 (*)    All other components within normal limits  COMPREHENSIVE METABOLIC PANEL  LIPASE, BLOOD  POCT PREGNANCY, URINE   Imaging Review Ct Abdomen  Pelvis W Contrast  09/12/2013   CLINICAL DATA:  Nausea, vomiting and diarrhea for 1 week, now with sharp abdominal pain.  EXAM: CT ABDOMEN AND PELVIS WITH CONTRAST  TECHNIQUE: Multidetector CT imaging of the abdomen and pelvis was performed using the standard protocol following bolus administration of intravenous contrast.  CONTRAST:  OMNIPAQUE IOHEXOL 300 MG/ML  SOLN  COMPARISON:  Abdominal ultrasound -09/06/2013  FINDINGS: Normal hepatic contour. There is a minimal amount of focal fatty infiltration adjacent to the fissure for the ligamentum teres. No discrete hepatic lesions. Normal appearance of the gallbladder given degree of distention. No definite intra extrahepatic biliary duct dilatation. No ascites.  There is symmetric enhancement of the bilateral kidneys. No definite renal stones. No discrete renal lesions. Aorta obstruction or perinephric stranding. Normal appearance of the bilateral adrenal glands, pancreas and spleen.  Ingested enteric contrast extends to the level of the terminal ileum and cecum. No evidence of enteric obstruction. Scattered minimal colonic diverticulosis without evidence of diverticulitis. The bowel is otherwise normal in course and caliber without wall thickening or evidence of obstruction. Normal appearance of the appendix. No pneumoperitoneum, pneumatosis or portal venous gas.  Normal caliber of the abdominal aorta. The major branch vessels of the abdominal aorta appear patent on this non CTA examination. Incidental note is made of a circumaortic left-sided renal vein.  Shotty mesenteric lymph nodes within the right lower abdominal quadrant are individually not enlarged by size criteria with index right lower quadrant  mesenteric node measuring 0.7 cm in diameter (image 44, series 2). No definite retroperitoneal, mesenteric, pelvic or inguinal lymphadenopathy.  Normal appearance of the retroflexed uterus. No discrete adnexal lesion. There is a trace amount of presumably physiologic free fluid within the pelvic cul-de-sac  Limited visualization of the lower thorax is negative focal airspace opacity or pleural effusion. Normal heart size. No pericardial effusion.  No acute or aggressive osseus abnormalities. Small mesenteric fat containing periumbilical hernia.  IMPRESSION: 1. No explanation for patient's ongoing abdominal pain, nausea, vomiting diarrhea. Specifically, no evidence of enteric or urinary obstruction. 2. Colonic diverticulosis without evidence of diverticulitis.   Electronically Signed   By: Simonne Come M.D.   On: 09/12/2013 14:42    EKG Interpretation   None       MDM   1. Abdominal pain   2. Vomiting and diarrhea   3. Hematemesis   4. Hematochezia    I doubt any other EMC precluding discharge at this time including, but not necessarily limited to the following:significant GI bleed, SBI, peritonitis.    Hurman Horn, MD 09/13/13 [phone]

## 2013-09-12 NOTE — ED Notes (Signed)
Patient transported to CT 

## 2013-09-12 NOTE — ED Notes (Addendum)
Pt. Has a one week c/o n/v/d and was seen here 7 days ago for the same. Pt. reports that s/s have gotten worse and that she is constantly n/v/d now with sharp abdominal pains that radiate through to her back.  Pt. Is noted with blood in her vomit and stool.

## 2013-09-12 NOTE — ED Notes (Signed)
Left message for Margaret Cline that a new #22g Nexiva PIV was started in rt hand. The existing IV is working but very positional and could not be used for CT contrast.

## 2013-11-23 ENCOUNTER — Emergency Department (HOSPITAL_COMMUNITY): Payer: Medicaid Other

## 2013-11-23 ENCOUNTER — Encounter (HOSPITAL_COMMUNITY): Payer: Self-pay | Admitting: Emergency Medicine

## 2013-11-23 ENCOUNTER — Emergency Department (HOSPITAL_COMMUNITY)
Admission: EM | Admit: 2013-11-23 | Discharge: 2013-11-23 | Disposition: A | Discharge status: Home / Self Care | Payer: Medicaid Other | Attending: Emergency Medicine | Admitting: Emergency Medicine

## 2013-11-23 DIAGNOSIS — Z87440 Personal history of urinary (tract) infections: Secondary | ICD-10-CM | POA: Insufficient documentation

## 2013-11-23 DIAGNOSIS — T07XXXA Unspecified multiple injuries, initial encounter: Secondary | ICD-10-CM

## 2013-11-23 DIAGNOSIS — Z8719 Personal history of other diseases of the digestive system: Secondary | ICD-10-CM | POA: Insufficient documentation

## 2013-11-23 DIAGNOSIS — S6000XA Contusion of unspecified finger without damage to nail, initial encounter: Secondary | ICD-10-CM | POA: Insufficient documentation

## 2013-11-23 DIAGNOSIS — S0003XA Contusion of scalp, initial encounter: Secondary | ICD-10-CM | POA: Insufficient documentation

## 2013-11-23 DIAGNOSIS — S63610A Unspecified sprain of right index finger, initial encounter: Secondary | ICD-10-CM

## 2013-11-23 DIAGNOSIS — G43909 Migraine, unspecified, not intractable, without status migrainosus: Secondary | ICD-10-CM | POA: Insufficient documentation

## 2013-11-23 DIAGNOSIS — Y929 Unspecified place or not applicable: Secondary | ICD-10-CM | POA: Insufficient documentation

## 2013-11-23 DIAGNOSIS — S6390XA Sprain of unspecified part of unspecified wrist and hand, initial encounter: Secondary | ICD-10-CM | POA: Insufficient documentation

## 2013-11-23 DIAGNOSIS — Z79899 Other long term (current) drug therapy: Secondary | ICD-10-CM | POA: Insufficient documentation

## 2013-11-23 DIAGNOSIS — Z8742 Personal history of other diseases of the female genital tract: Secondary | ICD-10-CM | POA: Insufficient documentation

## 2013-11-23 DIAGNOSIS — E669 Obesity, unspecified: Secondary | ICD-10-CM | POA: Insufficient documentation

## 2013-11-23 DIAGNOSIS — J45909 Unspecified asthma, uncomplicated: Secondary | ICD-10-CM | POA: Insufficient documentation

## 2013-11-23 DIAGNOSIS — Z791 Long term (current) use of non-steroidal anti-inflammatories (NSAID): Secondary | ICD-10-CM | POA: Insufficient documentation

## 2013-11-23 DIAGNOSIS — Y939 Activity, unspecified: Secondary | ICD-10-CM | POA: Insufficient documentation

## 2013-11-23 DIAGNOSIS — Z8619 Personal history of other infectious and parasitic diseases: Secondary | ICD-10-CM | POA: Insufficient documentation

## 2013-11-23 DIAGNOSIS — S0083XA Contusion of other part of head, initial encounter: Secondary | ICD-10-CM

## 2013-11-23 DIAGNOSIS — S1093XA Contusion of unspecified part of neck, initial encounter: Secondary | ICD-10-CM

## 2013-11-23 MED ORDER — CYCLOBENZAPRINE HCL 10 MG PO TABS
10.0000 mg | ORAL_TABLET | Freq: Once | ORAL | Status: AC
  Administered 2013-11-23: 10 mg via ORAL
  Filled 2013-11-23: qty 1

## 2013-11-23 MED ORDER — OXYCODONE-ACETAMINOPHEN 5-325 MG PO TABS: 2.0000 | ORAL_TABLET | ORAL | Status: DC | PRN

## 2013-11-23 MED ORDER — IBUPROFEN 800 MG PO TABS
800.0000 mg | ORAL_TABLET | Freq: Once | ORAL | Status: AC
  Administered 2013-11-23: 800 mg via ORAL
  Filled 2013-11-23: qty 1

## 2013-11-23 MED ORDER — IBUPROFEN 800 MG PO TABS: 800.0000 mg | ORAL_TABLET | Freq: Three times a day (TID) | ORAL | Status: DC

## 2013-11-23 MED ORDER — ALBUTEROL SULFATE HFA 108 (90 BASE) MCG/ACT IN AERS
1.0000 | INHALATION_SPRAY | Freq: Once | RESPIRATORY_TRACT | Status: AC
  Administered 2013-11-23: 2 via RESPIRATORY_TRACT
  Filled 2013-11-23: qty 6.7

## 2013-11-23 MED ORDER — OXYCODONE-ACETAMINOPHEN 5-325 MG PO TABS
2.0000 | ORAL_TABLET | Freq: Once | ORAL | Status: AC
  Administered 2013-11-23: 2 via ORAL
  Filled 2013-11-23: qty 2
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
## 2013-11-23 NOTE — ED Notes (Signed)
Pt sts she is feeling anxious. Requesting an albuterol inhaler.

## 2013-11-23 NOTE — ED Notes (Signed)
Family at bedside. 
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
## 2014-01-12 ENCOUNTER — Emergency Department (HOSPITAL_COMMUNITY)
Admission: EM | Admit: 2014-01-12 | Discharge: 2014-01-12 | Disposition: A | Discharge status: Home / Self Care | Payer: Medicaid Other | Attending: Emergency Medicine | Admitting: Emergency Medicine

## 2014-01-12 DIAGNOSIS — G43909 Migraine, unspecified, not intractable, without status migrainosus: Secondary | ICD-10-CM | POA: Insufficient documentation

## 2014-01-12 DIAGNOSIS — Z8742 Personal history of other diseases of the female genital tract: Secondary | ICD-10-CM | POA: Insufficient documentation

## 2014-01-12 DIAGNOSIS — Z791 Long term (current) use of non-steroidal anti-inflammatories (NSAID): Secondary | ICD-10-CM | POA: Insufficient documentation

## 2014-01-12 DIAGNOSIS — E669 Obesity, unspecified: Secondary | ICD-10-CM | POA: Insufficient documentation

## 2014-01-12 DIAGNOSIS — J309 Allergic rhinitis, unspecified: Secondary | ICD-10-CM

## 2014-01-12 DIAGNOSIS — J069 Acute upper respiratory infection, unspecified: Secondary | ICD-10-CM | POA: Insufficient documentation

## 2014-01-12 DIAGNOSIS — Z79899 Other long term (current) drug therapy: Secondary | ICD-10-CM | POA: Insufficient documentation

## 2014-01-12 DIAGNOSIS — J45901 Unspecified asthma with (acute) exacerbation: Secondary | ICD-10-CM | POA: Insufficient documentation

## 2014-01-12 DIAGNOSIS — Z87440 Personal history of urinary (tract) infections: Secondary | ICD-10-CM | POA: Insufficient documentation

## 2014-01-12 DIAGNOSIS — Z8719 Personal history of other diseases of the digestive system: Secondary | ICD-10-CM | POA: Insufficient documentation

## 2014-01-12 DIAGNOSIS — R0981 Nasal congestion: Secondary | ICD-10-CM

## 2014-01-12 DIAGNOSIS — Z8619 Personal history of other infectious and parasitic diseases: Secondary | ICD-10-CM | POA: Insufficient documentation

## 2014-01-12 DIAGNOSIS — IMO0002 Reserved for concepts with insufficient information to code with codable children: Secondary | ICD-10-CM | POA: Insufficient documentation

## 2014-01-12 MED ORDER — FEXOFENADINE HCL 60 MG PO TABS: 60.0000 mg | ORAL_TABLET | Freq: Two times a day (BID) | ORAL | Status: DC

## 2014-01-12 MED ORDER — ALBUTEROL SULFATE HFA 108 (90 BASE) MCG/ACT IN AERS: 1.0000 | INHALATION_SPRAY | Freq: Four times a day (QID) | RESPIRATORY_TRACT | Status: DC | PRN

## 2014-01-12 MED ORDER — FLUTICASONE PROPIONATE 50 MCG/ACT NA SUSP: NASAL | Status: DC

## 2014-01-12 MED ORDER — ALBUTEROL SULFATE (2.5 MG/3ML) 0.083% IN NEBU: 2.5000 mg | INHALATION_SOLUTION | Freq: Four times a day (QID) | RESPIRATORY_TRACT | Status: DC | PRN

## 2014-01-12 NOTE — ED Provider Notes (Signed)
CSN: [id number]     Arrival date & time 01/12/14  4782 History   First MD Initiated Contact with Patient 01/12/14 [id number]     Chief Complaint  Patient presents with  . Facial Pain  . Asthma      HPI  Patient presents with nasal congestion. Has had nasal allergy symptoms watery eyes. Her cough. She'll likely need to use albuterol home. Is out of her MDI, and her nebulizer solution. No fever. No shortness of breath. She sore all over from her cough. Cough is primarily nocturnal, but dry and nonproductive.  Past Medical History  Diagnosis Date  . Asthma   . Chlamydia 2008  . Gonorrhea   . Irregular periods/menstrual cycles   . Constipation, chronic 11/13/11  . Migraines     Migraines  . Depression     postpartum  . Trichomonas 2008  . Hx: UTI (urinary tract infection)     Frequently  . Obese    Past Surgical History  Procedure Laterality Date  . Wisdom tooth extraction    . Induced abortion     Family History  Problem Relation Age of Onset  . Anesthesia problems Neg Hx   . Hypotension Neg Hx   . Malignant hyperthermia Neg Hx   . Pseudochol deficiency Neg Hx   . Hypertension Mother   . Heart disease Mother   . Diabetes Mother   . Stroke Mother   . Hyperlipidemia Mother   . Hypertension Sister   . Thrombophlebitis Sister     clotting issue  . Depression Sister   . Heart disease Maternal Grandfather   . Drug abuse Father   . Alcohol abuse Maternal Uncle   . Kidney disease Maternal Uncle     failure  . Rheum arthritis Sister     arthritis vs fibromyalgia  . Heart murmur Sister   . Fibromyalgia Sister   . Depression Sister   . Hypertension Sister    History  Substance Use Topics  . Smoking status: Never Smoker   . Smokeless tobacco: Never Used  . Alcohol Use: Yes     Comment: occasional   OB History   Grav Para Term Preterm Abortions TAB SAB Ect Mult Living   3 2 2  0 1 1 0 0 0 2     Review of Systems  Constitutional: Negative for fever, chills,  diaphoresis, appetite change and fatigue.  HENT: Positive for congestion, postnasal drip, rhinorrhea, sinus pressure and sneezing. Negative for mouth sores, sore throat and trouble swallowing.   Eyes: Negative for visual disturbance.  Respiratory: Positive for cough and shortness of breath. Negative for chest tightness and wheezing.   Cardiovascular: Negative for chest pain.  Gastrointestinal: Negative for nausea, vomiting, abdominal pain, diarrhea and abdominal distention.  Endocrine: Negative for polydipsia, polyphagia and polyuria.  Genitourinary: Negative for dysuria, frequency and hematuria.  Musculoskeletal: Negative for gait problem.  Skin: Negative for color change, pallor and rash.  Neurological: Negative for dizziness, syncope, light-headedness and headaches.  Hematological: Does not bruise/bleed easily.  Psychiatric/Behavioral: Negative for behavioral problems and confusion.      Allergies  Review of patient's allergies indicates no known allergies.  Home Medications   Current Outpatient Rx  Name  Route  Sig  Dispense  Refill  . albuterol (PROAIR HFA) 108 (90 BASE) MCG/ACT inhaler   Inhalation   Inhale 2 puffs into the lungs every 4 (four) hours as needed for wheezing or shortness of breath. For shortness of breath/wheezing         .  albuterol (PROVENTIL HFA;VENTOLIN HFA) 108 (90 BASE) MCG/ACT inhaler   Inhalation   Inhale 1-2 puffs into the lungs every 6 (six) hours as needed for wheezing.   1 Inhaler   0   . albuterol (PROVENTIL) (2.5 MG/3ML) 0.083% nebulizer solution   Nebulization   Take 2.5 mg by nebulization every 6 (six) hours as needed for wheezing or shortness of breath. For shortness of breath         . albuterol (PROVENTIL) (2.5 MG/3ML) 0.083% nebulizer solution   Nebulization   Take 3 mLs (2.5 mg total) by nebulization every 6 (six) hours as needed for wheezing or shortness of breath.   75 mL   12   . fexofenadine (ALLEGRA) 60 MG tablet   Oral    Take 1 tablet (60 mg total) by mouth 2 (two) times daily.   60 tablet   0   . fluticasone (FLONASE) 50 MCG/ACT nasal spray      1 spray each nares bid   10 g   1   . ibuprofen (ADVIL,MOTRIN) 800 MG tablet   Oral   Take 1 tablet (800 mg total) by mouth 3 (three) times daily.   21 tablet   0   . EXPIRED: metoCLOPramide (REGLAN) 10 MG tablet   Oral   Take 1 tablet (10 mg total) by mouth every 6 (six) hours as needed (nausea/headache).   6 tablet   0   . metoCLOPramide (REGLAN) 10 MG tablet   Oral   Take 1 tablet (10 mg total) by mouth every 6 (six) hours as needed for nausea (nausea/headache).   6 tablet   0   . mometasone-formoterol (DULERA) 200-5 MCG/ACT AERO   Inhalation   Inhale 2 puffs into the lungs 2 (two) times daily. For asthma.   1 Inhaler   0   . naproxen sodium (ANAPROX) 220 MG tablet   Oral   Take 440 mg by mouth 2 (two) times daily.         Marland Kitchen omeprazole (PRILOSEC) 20 MG capsule   Oral   Take 1 capsule (20 mg total) by mouth daily.   5 capsule   0   . ondansetron (ZOFRAN ODT) 8 MG disintegrating tablet      8mg  ODT q4 hours prn nausea   4 tablet   0   . oxyCODONE-acetaminophen (PERCOCET/ROXICET) 5-325 MG per tablet   Oral   Take 2 tablets by mouth every 4 (four) hours as needed for severe pain.   15 tablet   0   . pseudoephedrine-acetaminophen (TYLENOL SINUS) 30-500 MG TABS   Oral   Take 2 tablets by mouth every 6 (six) hours as needed.          BP 113/77  Pulse 93  Temp(Src) 98.1 F (36.7 C) (Oral)  Resp 16  SpO2 100% Physical Exam  Constitutional: She is oriented to person, place, and time. She appears well-developed and well-nourished. No distress.  HENT:  Head: Normocephalic.  Nose: Mucosal edema and rhinorrhea present. Right sinus exhibits maxillary sinus tenderness and frontal sinus tenderness. Left sinus exhibits maxillary sinus tenderness and frontal sinus tenderness.  Eyes: Conjunctivae are normal. Pupils are equal,  round, and reactive to light. No scleral icterus.  Neck: Normal range of motion. Neck supple. No thyromegaly present.  Cardiovascular: Normal rate and regular rhythm.  Exam reveals no gallop and no friction rub.   No murmur heard. Pulmonary/Chest: Effort normal. No respiratory distress. She has no decreased breath sounds.  She has wheezes. She has no rhonchi. She has no rales.  No prolongation  Abdominal: Soft. Bowel sounds are normal. She exhibits no distension. There is no tenderness. There is no rebound.  Musculoskeletal: Normal range of motion.  Neurological: She is alert and oriented to person, place, and time.  Skin: Skin is warm and dry. No rash noted.  Psychiatric: She has a normal mood and affect. Her behavior is normal.    ED Course  Procedures (including critical care time) Labs Review Labs Reviewed - No data to display Imaging Review No results found.   EKG Interpretation None      MDM   Final diagnoses:  URI (upper respiratory infection)  Sinus congestion  Allergic rhinitis    Plan antihistamines, Flonase, albuterol MDI refill, albuterol nebulizer solution refill.    Rolland PorterMark Prisma Decarlo, MD 01/12/14 [phone]

## 2014-01-12 NOTE — ED Notes (Signed)
Pt came to ED with c/o sinus pain in face and cough/congestion. Has hx of asthma, out of inhaler and nebulizer meds. Dry cough, nonproductive

## 2014-01-12 NOTE — Discharge Instructions (Signed)
Viral Infections A virus is a type of germ. Viruses can cause:  Minor sore throats.  Aches and pains.  Headaches.  Runny nose.  Rashes.  Watery eyes.  Tiredness.  Coughs.  Loss of appetite.  Feeling sick to your stomach (nausea).  Throwing up (vomiting).  Watery poop (diarrhea). HOME CARE   Only take medicines as told by your doctor.  Drink enough water and fluids to keep your pee (urine) clear or pale yellow. Sports drinks are a good choice.  Get plenty of rest and eat healthy. Soups and broths with crackers or rice are fine. GET HELP RIGHT AWAY IF:   You have a very bad headache.  You have shortness of breath.  You have chest pain or neck pain.  You have an unusual rash.  You cannot stop throwing up.  You have watery poop that does not stop.  You cannot keep fluids down.  You or your child has a temperature by mouth above 102 F (38.9 C), not controlled by medicine.  Your baby is older than 3 months with a rectal temperature of 102 F (38.9 C) or higher.  Your baby is 3 months old or younger with a rectal temperature of 100.4 F (38 C) or higher. MAKE SURE YOU:   Understand these instructions.  Will watch this condition.  Will get help right away if you are not doing well or get worse. Document Released: 09/13/2008 Document Revised: 12/24/2011 Document Reviewed: 02/06/2011 [employer] Patient Information 2014 [employer]. Allergic Rhinitis Allergic rhinitis is when the mucous membranes in the nose respond to allergens. Allergens are particles in the air that cause your body to have an allergic reaction. This causes you to release allergic antibodies. Through a chain of events, these eventually cause you to release histamine into the blood stream. Although meant to protect the body, it is this release of histamine that causes your discomfort, such as frequent sneezing, congestion, and an itchy, runny nose.  CAUSES  Seasonal allergic rhinitis (hay  fever) is caused by pollen allergens that may come from grasses, trees, and weeds. Year-round allergic rhinitis (perennial allergic rhinitis) is caused by allergens such as house dust mites, pet dander, and mold spores.  SYMPTOMS   Nasal stuffiness (congestion).  Itchy, runny nose with sneezing and tearing of the eyes. DIAGNOSIS  Your health care provider can help you determine the allergen or allergens that trigger your symptoms. If you and your health care provider are unable to determine the allergen, skin or blood testing may be used. TREATMENT  Allergic Rhinitis does not have a cure, but it can be controlled by:  Medicines and allergy shots (immunotherapy).  Avoiding the allergen. Hay fever may often be treated with antihistamines in pill or nasal spray forms. Antihistamines block the effects of histamine. There are over-the-counter medicines that may help with nasal congestion and swelling around the eyes. Check with your health care provider before taking or giving this medicine.  If avoiding the allergen or the medicine prescribed do not work, there are many new medicines your health care provider can prescribe. Stronger medicine may be used if initial measures are ineffective. Desensitizing injections can be used if medicine and avoidance does not work. Desensitization is when a patient is given ongoing shots until the body becomes less sensitive to the allergen. Make sure you follow up with your health care provider if problems continue. HOME CARE INSTRUCTIONS It is not possible to completely avoid allergens, but you can reduce your symptoms   by taking steps to limit your exposure to them. It helps to know exactly what you are allergic to so that you can avoid your specific triggers. SEEK MEDICAL CARE IF:   You have a fever.  You develop a cough that does not stop easily (persistent).  You have shortness of breath.  You start wheezing.  Symptoms interfere with normal daily  activities. Document Released: 06/26/2001 Document Revised: 07/22/2013 Document Reviewed: 06/08/2013 [employer] Patient Information 2014 [employer].  

## 2014-01-28 ENCOUNTER — Encounter (HOSPITAL_COMMUNITY): Payer: Self-pay | Admitting: Emergency Medicine

## 2014-01-28 ENCOUNTER — Emergency Department (HOSPITAL_COMMUNITY)
Admission: EM | Admit: 2014-01-28 | Discharge: 2014-01-28 | Disposition: A | Discharge status: Home / Self Care | Payer: Medicaid Other | Attending: Emergency Medicine | Admitting: Emergency Medicine

## 2014-01-28 DIAGNOSIS — E669 Obesity, unspecified: Secondary | ICD-10-CM | POA: Insufficient documentation

## 2014-01-28 DIAGNOSIS — H9209 Otalgia, unspecified ear: Secondary | ICD-10-CM | POA: Insufficient documentation

## 2014-01-28 DIAGNOSIS — IMO0002 Reserved for concepts with insufficient information to code with codable children: Secondary | ICD-10-CM | POA: Insufficient documentation

## 2014-01-28 DIAGNOSIS — Z87440 Personal history of urinary (tract) infections: Secondary | ICD-10-CM | POA: Insufficient documentation

## 2014-01-28 DIAGNOSIS — Z8619 Personal history of other infectious and parasitic diseases: Secondary | ICD-10-CM | POA: Insufficient documentation

## 2014-01-28 DIAGNOSIS — J029 Acute pharyngitis, unspecified: Secondary | ICD-10-CM | POA: Insufficient documentation

## 2014-01-28 DIAGNOSIS — Z8659 Personal history of other mental and behavioral disorders: Secondary | ICD-10-CM | POA: Insufficient documentation

## 2014-01-28 DIAGNOSIS — J45909 Unspecified asthma, uncomplicated: Secondary | ICD-10-CM | POA: Insufficient documentation

## 2014-01-28 DIAGNOSIS — Z8742 Personal history of other diseases of the female genital tract: Secondary | ICD-10-CM | POA: Insufficient documentation

## 2014-01-28 DIAGNOSIS — G43909 Migraine, unspecified, not intractable, without status migrainosus: Secondary | ICD-10-CM | POA: Insufficient documentation

## 2014-01-28 DIAGNOSIS — Z8719 Personal history of other diseases of the digestive system: Secondary | ICD-10-CM | POA: Insufficient documentation

## 2014-01-28 DIAGNOSIS — Z791 Long term (current) use of non-steroidal anti-inflammatories (NSAID): Secondary | ICD-10-CM | POA: Insufficient documentation

## 2014-01-28 DIAGNOSIS — Z79899 Other long term (current) drug therapy: Secondary | ICD-10-CM | POA: Insufficient documentation

## 2014-01-28 LAB — RAPID STREP SCREEN (MED CTR MEBANE ONLY): Streptococcus, Group A Screen (Direct): NEGATIVE

## 2014-01-28 MED ORDER — AMOXICILLIN 500 MG PO CAPS: 500.0000 mg | ORAL_CAPSULE | Freq: Three times a day (TID) | ORAL | Status: DC

## 2014-01-28 NOTE — ED Provider Notes (Signed)
CSN: [id number]     Arrival date & time 01/28/14  2003 History   First MD Initiated Contact with Patient 01/28/14 2025    This chart was scribed for non-physician practitioner, Jaynie Crumble, PA, working with Raeford Razor, MD by Marica Otter, ED Scribe. This patient was seen in room TR04C/TR04C and the patient's care was started at 8:53 PM.  PCP: PROVIDER NOT IN SYSTEM  Chief Complaint  Patient presents with  . Sore Throat   The history is provided by the patient. No language interpreter was used.   HPI Comments: Margaret Cline is a 23 y.o. female who presents to the Emergency Department complaining of a sore throat with associated congestion and ear pain onset approximately one week ago. Pt reports she tried multiple different over the counter pain meds and gurgling with salt without relief.   Sick Contact: Son had strep and ear infection.   Past Medical History  Diagnosis Date  . Asthma   . Chlamydia 2008  . Gonorrhea   . Irregular periods/menstrual cycles   . Constipation, chronic 11/13/11  . Migraines     Migraines  . Depression     postpartum  . Trichomonas 2008  . Hx: UTI (urinary tract infection)     Frequently  . Obese    Past Surgical History  Procedure Laterality Date  . Wisdom tooth extraction    . Induced abortion     Family History  Problem Relation Age of Onset  . Anesthesia problems Neg Hx   . Hypotension Neg Hx   . Malignant hyperthermia Neg Hx   . Pseudochol deficiency Neg Hx   . Hypertension Mother   . Heart disease Mother   . Diabetes Mother   . Stroke Mother   . Hyperlipidemia Mother   . Hypertension Sister   . Thrombophlebitis Sister     clotting issue  . Depression Sister   . Heart disease Maternal Grandfather   . Drug abuse Father   . Alcohol abuse Maternal Uncle   . Kidney disease Maternal Uncle     failure  . Rheum arthritis Sister     arthritis vs fibromyalgia  . Heart murmur Sister   . Fibromyalgia Sister   . Depression  Sister   . Hypertension Sister    History  Substance Use Topics  . Smoking status: Never Smoker   . Smokeless tobacco: Never Used  . Alcohol Use: Yes     Comment: occasional   OB History   Grav Para Term Preterm Abortions TAB SAB Ect Mult Living   3 2 2  0 1 1 0 0 0 2     Review of Systems  HENT: Positive for ear pain and sore throat.       Allergies  Review of patient's allergies indicates no known allergies.  Home Medications   Prior to Admission medications   Medication Sig Start Date End Date Taking? Authorizing Provider  albuterol (PROAIR HFA) 108 (90 BASE) MCG/ACT inhaler Inhale 2 puffs into the lungs every 4 (four) hours as needed for wheezing or shortness of breath. For shortness of breath/wheezing    Historical Provider, MD  albuterol (PROVENTIL HFA;VENTOLIN HFA) 108 (90 BASE) MCG/ACT inhaler Inhale 1-2 puffs into the lungs every 6 (six) hours as needed for wheezing. 01/12/14   Rolland Porter, MD  albuterol (PROVENTIL) (2.5 MG/3ML) 0.083% nebulizer solution Take 2.5 mg by nebulization every 6 (six) hours as needed for wheezing or shortness of breath. For shortness of breath  Historical Provider, MD  albuterol (PROVENTIL) (2.5 MG/3ML) 0.083% nebulizer solution Take 3 mLs (2.5 mg total) by nebulization every 6 (six) hours as needed for wheezing or shortness of breath. 01/12/14   Rolland PorterMark James, MD  fexofenadine (ALLEGRA) 60 MG tablet Take 1 tablet (60 mg total) by mouth 2 (two) times daily. 01/12/14   Rolland PorterMark James, MD  fluticasone Aleda Grana(FLONASE) 50 MCG/ACT nasal spray 1 spray each nares bid 01/12/14   Rolland PorterMark James, MD  ibuprofen (ADVIL,MOTRIN) 800 MG tablet Take 1 tablet (800 mg total) by mouth 3 (three) times daily. 11/23/13   Sunnie NielsenBrian Opitz, MD  metoCLOPramide (REGLAN) 10 MG tablet Take 1 tablet (10 mg total) by mouth every 6 (six) hours as needed (nausea/headache). 09/05/11 09/15/11  Hurman HornJohn M Bednar, MD  metoCLOPramide (REGLAN) 10 MG tablet Take 1 tablet (10 mg total) by mouth every 6 (six) hours  as needed for nausea (nausea/headache). 09/12/13   Hurman HornJohn M Bednar, MD  mometasone-formoterol (DULERA) 200-5 MCG/ACT AERO Inhale 2 puffs into the lungs 2 (two) times daily. For asthma. 08/27/13   Verne SpurrNeil Mashburn, PA-C  naproxen sodium (ANAPROX) 220 MG tablet Take 440 mg by mouth 2 (two) times daily.    Historical Provider, MD  omeprazole (PRILOSEC) 20 MG capsule Take 1 capsule (20 mg total) by mouth daily. 09/12/13   Hurman HornJohn M Bednar, MD  ondansetron (ZOFRAN ODT) 8 MG disintegrating tablet 8mg  ODT q4 hours prn nausea 09/12/13   Hurman HornJohn M Bednar, MD  oxyCODONE-acetaminophen (PERCOCET/ROXICET) 5-325 MG per tablet Take 2 tablets by mouth every 4 (four) hours as needed for severe pain. 11/23/13   Sunnie NielsenBrian Opitz, MD  pseudoephedrine-acetaminophen (TYLENOL SINUS) 30-500 MG TABS Take 2 tablets by mouth every 6 (six) hours as needed.    Historical Provider, MD   Triage Vitals: BP 128/77  Pulse 85  Temp(Src) 98.2 F (36.8 C) (Oral)  Resp 12  Ht 5\' 7"  (1.702 m)  Wt 247 lb 5 oz (112.18 kg)  BMI 38.73 kg/m2  SpO2 96%  LMP 01/28/2014  Breastfeeding? No Physical Exam  Nursing note and vitals reviewed. Constitutional: She is oriented to person, place, and time. She appears well-developed and well-nourished. No distress.  HENT:  Head: Normocephalic and atraumatic.  Bilateral enlarged tonsils with right tonsil bigger than the left with exudate. Uvula is midline. Normal ear canals and TMs bilaterally. Nasal congestion present.   Eyes: EOM are normal.  Neck: Neck supple. No tracheal deviation present.  Cardiovascular: Normal rate.   Pulmonary/Chest: Effort normal. No respiratory distress. She has no wheezes. She has no rales.  Musculoskeletal: Normal range of motion.  Lymphadenopathy:    She has cervical adenopathy.  Neurological: She is alert and oriented to person, place, and time.  Skin: Skin is warm and dry.  Psychiatric: She has a normal mood and affect. Her behavior is normal.    ED Course  Procedures  (including critical care time) DIAGNOSTIC STUDIES: Oxygen Saturation is 96% on RA, adequate by my interpretation.    COORDINATION OF CARE:  8:56 PM-Discussed treatment plan which includes strep screen and ENT referral with pt at bedside and pt agreed to plan.   Labs Review Labs Reviewed  RAPID STREP SCREEN    Imaging Review No results found.   EKG Interpretation None      MDM   Final diagnoses:  Pharyngitis    Patient in emergency department with silk throat and congestion for one week. On exam she does have enlarged tonsils, right tonsil larger than the left, however  uvula is midline I do not suspect she has a peritonsillar abscess at this time. She does have some exit date on her tonsils bilaterally. She is able to swallow with no problems. She's afebrile. She does have some anterior cervical lymphadenopathy. Strep was negative, however given her exam findings along with the fact that her son just had strep throat, will put on amoxicillin. Followup with primary care Dr. closely.  Filed Vitals:   01/28/14 2012  BP: 128/77  Pulse: 85  Temp: 98.2 F (36.8 C)  TempSrc: Oral  Resp: 12  Height: 5\' 7"  (1.702 m)  Weight: 247 lb 5 oz (112.18 kg)  SpO2: 96%   I personally performed the services described in this documentation, which was scribed in my presence. The recorded information has been reviewed and is accurate.    Lottie Musselatyana A Harce Volden, PA-C 01/28/14 2127

## 2014-01-28 NOTE — Discharge Instructions (Signed)
Continue over-the-counter treatments including time, Tylenol, salt water gargles, decongestants, an allergy medicines. Take amoxicillin as prescribed until all gone. Followup with your primary care doctor.   Pharyngitis Pharyngitis is redness, pain, and swelling (inflammation) of your pharynx.  CAUSES  Pharyngitis is usually caused by infection. Most of the time, these infections are from viruses (viral) and are part of a cold. However, sometimes pharyngitis is caused by bacteria (bacterial). Pharyngitis can also be caused by allergies. Viral pharyngitis may be spread from person to person by coughing, sneezing, and personal items or utensils (cups, forks, spoons, toothbrushes). Bacterial pharyngitis may be spread from person to person by more intimate contact, such as kissing.  SIGNS AND SYMPTOMS  Symptoms of pharyngitis include:   Sore throat.   Tiredness (fatigue).   Low-grade fever.   Headache.  Joint pain and muscle aches.  Skin rashes.  Swollen lymph nodes.  Plaque-like film on throat or tonsils (often seen with bacterial pharyngitis). DIAGNOSIS  Your health care provider will ask you questions about your illness and your symptoms. Your medical history, along with a physical exam, is often all that is needed to diagnose pharyngitis. Sometimes, a rapid strep test is done. Other lab tests may also be done, depending on the suspected cause.  TREATMENT  Viral pharyngitis will usually get better in 3 4 days without the use of medicine. Bacterial pharyngitis is treated with medicines that kill germs (antibiotics).  HOME CARE INSTRUCTIONS   Drink enough water and fluids to keep your urine clear or pale yellow.   Only take over-the-counter or prescription medicines as directed by your health care provider:   If you are prescribed antibiotics, make sure you finish them even if you start to feel better.   Do not take aspirin.   Get lots of rest.   Gargle with 8 oz of salt  water ( tsp of salt per 1 qt of water) as often as every 1 2 hours to soothe your throat.   Throat lozenges (if you are not at risk for choking) or sprays may be used to soothe your throat. SEEK MEDICAL CARE IF:   You have large, tender lumps in your neck.  You have a rash.  You cough up green, yellow-brown, or bloody spit. SEEK IMMEDIATE MEDICAL CARE IF:   Your neck becomes stiff.  You drool or are unable to swallow liquids.  You vomit or are unable to keep medicines or liquids down.  You have severe pain that does not go away with the use of recommended medicines.  You have trouble breathing (not caused by a stuffy nose). MAKE SURE YOU:   Understand these instructions.  Will watch your condition.  Will get help right away if you are not doing well or get worse. Document Released: 10/01/2005 Document Revised: 07/22/2013 Document Reviewed: 06/08/2013 [facility] Patient Information 2014 [place], [place].

## 2014-01-28 NOTE — ED Notes (Signed)
Pt states she was seen last week for allergies, given an inhaler, neb, and pain medicine for sore throat. States her throat still hurts and now her ear hurts.

## 2014-01-28 NOTE — ED Notes (Signed)
Sore throat - rt. Side.  Rt. Ear hurts. Here recently here for sinusitis.

## 2014-01-30 LAB — CULTURE, GROUP A STREP

## 2014-02-01 NOTE — ED Provider Notes (Signed)
Medical screening examination/treatment/procedure(s) were performed by non-physician practitioner and as supervising physician I was immediately available for consultation/collaboration.   EKG Interpretation None       Syretta Kochel, MD 02/01/14 0922 

## 2014-04-14 ENCOUNTER — Emergency Department (HOSPITAL_COMMUNITY)
Admission: EM | Admit: 2014-04-14 | Discharge: 2014-04-15 | Disposition: A | Discharge status: Home / Self Care | Payer: Medicaid Other | Attending: Emergency Medicine | Admitting: Emergency Medicine

## 2014-04-14 ENCOUNTER — Encounter (HOSPITAL_COMMUNITY): Payer: Self-pay | Admitting: Emergency Medicine

## 2014-04-14 DIAGNOSIS — Z79899 Other long term (current) drug therapy: Secondary | ICD-10-CM | POA: Insufficient documentation

## 2014-04-14 DIAGNOSIS — Z791 Long term (current) use of non-steroidal anti-inflammatories (NSAID): Secondary | ICD-10-CM | POA: Insufficient documentation

## 2014-04-14 DIAGNOSIS — G8929 Other chronic pain: Secondary | ICD-10-CM | POA: Insufficient documentation

## 2014-04-14 DIAGNOSIS — J45909 Unspecified asthma, uncomplicated: Secondary | ICD-10-CM | POA: Insufficient documentation

## 2014-04-14 DIAGNOSIS — Z8742 Personal history of other diseases of the female genital tract: Secondary | ICD-10-CM | POA: Insufficient documentation

## 2014-04-14 DIAGNOSIS — S139XXA Sprain of joints and ligaments of unspecified parts of neck, initial encounter: Secondary | ICD-10-CM | POA: Insufficient documentation

## 2014-04-14 DIAGNOSIS — Y9389 Activity, other specified: Secondary | ICD-10-CM | POA: Insufficient documentation

## 2014-04-14 DIAGNOSIS — Z87440 Personal history of urinary (tract) infections: Secondary | ICD-10-CM | POA: Insufficient documentation

## 2014-04-14 DIAGNOSIS — E669 Obesity, unspecified: Secondary | ICD-10-CM | POA: Insufficient documentation

## 2014-04-14 DIAGNOSIS — Y92410 Unspecified street and highway as the place of occurrence of the external cause: Secondary | ICD-10-CM | POA: Insufficient documentation

## 2014-04-14 DIAGNOSIS — S39012A Strain of muscle, fascia and tendon of lower back, initial encounter: Secondary | ICD-10-CM

## 2014-04-14 DIAGNOSIS — Z8679 Personal history of other diseases of the circulatory system: Secondary | ICD-10-CM | POA: Insufficient documentation

## 2014-04-14 DIAGNOSIS — S161XXA Strain of muscle, fascia and tendon at neck level, initial encounter: Secondary | ICD-10-CM

## 2014-04-14 DIAGNOSIS — IMO0002 Reserved for concepts with insufficient information to code with codable children: Secondary | ICD-10-CM | POA: Insufficient documentation

## 2014-04-14 DIAGNOSIS — Z792 Long term (current) use of antibiotics: Secondary | ICD-10-CM | POA: Insufficient documentation

## 2014-04-14 DIAGNOSIS — Z8659 Personal history of other mental and behavioral disorders: Secondary | ICD-10-CM | POA: Insufficient documentation

## 2014-04-14 DIAGNOSIS — G44319 Acute post-traumatic headache, not intractable: Secondary | ICD-10-CM

## 2014-04-14 DIAGNOSIS — G44309 Post-traumatic headache, unspecified, not intractable: Secondary | ICD-10-CM | POA: Insufficient documentation

## 2014-04-14 DIAGNOSIS — S335XXA Sprain of ligaments of lumbar spine, initial encounter: Secondary | ICD-10-CM | POA: Insufficient documentation

## 2014-04-14 MED ORDER — TRAMADOL HCL 50 MG PO TABS
50.0000 mg | ORAL_TABLET | Freq: Once | ORAL | Status: AC
  Administered 2014-04-15: 50 mg via ORAL
  Filled 2014-04-14: qty 1
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
## 2014-04-15 ENCOUNTER — Emergency Department (HOSPITAL_COMMUNITY): Payer: Medicaid Other

## 2014-04-15 MED ORDER — NAPROXEN 500 MG PO TABS: 500.0000 mg | ORAL_TABLET | Freq: Two times a day (BID) | ORAL | Status: DC

## 2014-04-15 MED ORDER — TRAMADOL HCL 50 MG PO TABS: 50.0000 mg | ORAL_TABLET | Freq: Four times a day (QID) | ORAL | Status: DC | PRN

## 2014-04-15 MED ORDER — CYCLOBENZAPRINE HCL 10 MG PO TABS: 10.0000 mg | ORAL_TABLET | Freq: Two times a day (BID) | ORAL | Status: DC | PRN
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
## 2014-04-15 NOTE — ED Provider Notes (Signed)
Medical screening examination/treatment/procedure(s) were performed by non-physician practitioner and as supervising physician I was immediately available for consultation/collaboration.   EKG Interpretation None       Derwood KaplanAnkit Shaquila Sigman, MD 04/15/14 0221

## 2014-04-19 ENCOUNTER — Encounter (HOSPITAL_COMMUNITY): Payer: Self-pay | Admitting: Emergency Medicine

## 2014-04-19 DIAGNOSIS — Z87440 Personal history of urinary (tract) infections: Secondary | ICD-10-CM | POA: Insufficient documentation

## 2014-04-19 DIAGNOSIS — Z8742 Personal history of other diseases of the female genital tract: Secondary | ICD-10-CM | POA: Insufficient documentation

## 2014-04-19 DIAGNOSIS — Z791 Long term (current) use of non-steroidal anti-inflammatories (NSAID): Secondary | ICD-10-CM | POA: Insufficient documentation

## 2014-04-19 DIAGNOSIS — G8911 Acute pain due to trauma: Secondary | ICD-10-CM | POA: Insufficient documentation

## 2014-04-19 DIAGNOSIS — Z8659 Personal history of other mental and behavioral disorders: Secondary | ICD-10-CM | POA: Insufficient documentation

## 2014-04-19 DIAGNOSIS — E669 Obesity, unspecified: Secondary | ICD-10-CM | POA: Insufficient documentation

## 2014-04-19 DIAGNOSIS — Z79899 Other long term (current) drug therapy: Secondary | ICD-10-CM | POA: Insufficient documentation

## 2014-04-19 DIAGNOSIS — M545 Low back pain, unspecified: Secondary | ICD-10-CM | POA: Insufficient documentation

## 2014-04-19 DIAGNOSIS — J45909 Unspecified asthma, uncomplicated: Secondary | ICD-10-CM | POA: Insufficient documentation

## 2014-04-19 DIAGNOSIS — IMO0002 Reserved for concepts with insufficient information to code with codable children: Secondary | ICD-10-CM | POA: Insufficient documentation

## 2014-04-19 DIAGNOSIS — Z8619 Personal history of other infectious and parasitic diseases: Secondary | ICD-10-CM | POA: Insufficient documentation

## 2014-04-19 DIAGNOSIS — G43909 Migraine, unspecified, not intractable, without status migrainosus: Secondary | ICD-10-CM | POA: Insufficient documentation
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
## 2014-04-20 ENCOUNTER — Emergency Department (HOSPITAL_COMMUNITY)
Admission: EM | Admit: 2014-04-20 | Discharge: 2014-04-20 | Disposition: A | Discharge status: Home / Self Care | Payer: Medicaid Other | Attending: Emergency Medicine | Admitting: Emergency Medicine

## 2014-04-20 DIAGNOSIS — M545 Low back pain, unspecified: Secondary | ICD-10-CM

## 2014-04-20 MED ORDER — HYDROCODONE-ACETAMINOPHEN 5-325 MG PO TABS
2.0000 | ORAL_TABLET | Freq: Once | ORAL | Status: AC
  Administered 2014-04-20: 2 via ORAL
  Filled 2014-04-20: qty 2

## 2014-04-20 MED ORDER — HYDROCODONE-ACETAMINOPHEN 5-325 MG PO TABS: 1.0000 | ORAL_TABLET | Freq: Four times a day (QID) | ORAL | Status: DC | PRN
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
## 2014-04-20 NOTE — ED Provider Notes (Signed)
Medical screening examination/treatment/procedure(s) were performed by non-physician practitioner and as supervising physician I was immediately available for consultation/collaboration.   EKG Interpretation None       Naseem Adler M Arlester Keehan, MD 04/20/14 0648 

## 2014-04-20 NOTE — Discharge Instructions (Signed)

## 2014-05-04 ENCOUNTER — Ambulatory Visit: Payer: Medicaid Other | Attending: Orthopaedic Surgery | Admitting: Physical Therapy

## 2014-05-04 DIAGNOSIS — M25559 Pain in unspecified hip: Secondary | ICD-10-CM | POA: Insufficient documentation

## 2014-05-04 DIAGNOSIS — M545 Low back pain, unspecified: Secondary | ICD-10-CM | POA: Insufficient documentation

## 2014-05-04 DIAGNOSIS — IMO0001 Reserved for inherently not codable concepts without codable children: Secondary | ICD-10-CM | POA: Insufficient documentation

## 2014-05-07 ENCOUNTER — Ambulatory Visit: Payer: Medicaid Other

## 2014-05-11 ENCOUNTER — Ambulatory Visit: Payer: Medicaid Other | Admitting: Rehabilitation

## 2014-05-17 ENCOUNTER — Ambulatory Visit: Payer: Medicaid Other | Admitting: Rehabilitation

## 2014-05-19 ENCOUNTER — Ambulatory Visit: Payer: Medicaid Other | Attending: Orthopaedic Surgery | Admitting: Rehabilitation

## 2014-05-19 DIAGNOSIS — M545 Low back pain, unspecified: Secondary | ICD-10-CM | POA: Diagnosis not present

## 2014-05-19 DIAGNOSIS — M25559 Pain in unspecified hip: Secondary | ICD-10-CM | POA: Diagnosis not present

## 2014-05-19 DIAGNOSIS — IMO0001 Reserved for inherently not codable concepts without codable children: Secondary | ICD-10-CM | POA: Insufficient documentation

## 2014-05-24 ENCOUNTER — Encounter: Payer: Self-pay | Admitting: Rehabilitation

## 2014-05-26 ENCOUNTER — Encounter: Payer: Self-pay | Admitting: Physical Therapy

## 2014-05-27 ENCOUNTER — Ambulatory Visit: Payer: Medicaid Other | Admitting: Rehabilitation

## 2014-05-27 DIAGNOSIS — IMO0001 Reserved for inherently not codable concepts without codable children: Secondary | ICD-10-CM | POA: Diagnosis not present

## 2014-05-31 ENCOUNTER — Ambulatory Visit: Payer: Medicaid Other | Admitting: Rehabilitation

## 2014-05-31 DIAGNOSIS — IMO0001 Reserved for inherently not codable concepts without codable children: Secondary | ICD-10-CM | POA: Diagnosis not present

## 2014-06-02 ENCOUNTER — Encounter: Payer: Self-pay | Admitting: Rehabilitation

## 2014-06-03 ENCOUNTER — Ambulatory Visit: Payer: Medicaid Other

## 2014-06-03 DIAGNOSIS — IMO0001 Reserved for inherently not codable concepts without codable children: Secondary | ICD-10-CM | POA: Diagnosis not present

## 2014-06-07 ENCOUNTER — Ambulatory Visit: Payer: Medicaid Other | Admitting: Physical Therapy

## 2014-06-07 DIAGNOSIS — IMO0001 Reserved for inherently not codable concepts without codable children: Secondary | ICD-10-CM | POA: Diagnosis not present

## 2014-06-09 ENCOUNTER — Ambulatory Visit: Payer: Medicaid Other | Admitting: Physical Therapy

## 2014-06-09 DIAGNOSIS — IMO0001 Reserved for inherently not codable concepts without codable children: Secondary | ICD-10-CM | POA: Diagnosis not present

## 2014-06-15 ENCOUNTER — Ambulatory Visit: Payer: Medicaid Other | Attending: Orthopaedic Surgery | Admitting: Physical Therapy

## 2014-06-15 DIAGNOSIS — IMO0001 Reserved for inherently not codable concepts without codable children: Secondary | ICD-10-CM | POA: Insufficient documentation

## 2014-06-15 DIAGNOSIS — M545 Low back pain, unspecified: Secondary | ICD-10-CM | POA: Insufficient documentation

## 2014-06-15 DIAGNOSIS — M25559 Pain in unspecified hip: Secondary | ICD-10-CM | POA: Insufficient documentation

## 2014-06-17 ENCOUNTER — Encounter: Payer: Self-pay | Admitting: Rehabilitation

## 2014-06-22 ENCOUNTER — Encounter: Payer: Self-pay | Admitting: Physical Therapy

## 2014-06-24 ENCOUNTER — Encounter: Payer: Self-pay | Admitting: Rehabilitation

## 2014-08-16 ENCOUNTER — Encounter (HOSPITAL_COMMUNITY): Payer: Self-pay | Admitting: Emergency Medicine

## 2014-09-15 ENCOUNTER — Emergency Department (HOSPITAL_COMMUNITY): Payer: Medicaid Other

## 2014-09-15 ENCOUNTER — Encounter (HOSPITAL_COMMUNITY): Payer: Self-pay | Admitting: Family Medicine

## 2014-09-15 ENCOUNTER — Emergency Department (HOSPITAL_COMMUNITY)
Admission: EM | Admit: 2014-09-15 | Discharge: 2014-09-15 | Disposition: A | Discharge status: Home / Self Care | Payer: Medicaid Other | Attending: Emergency Medicine | Admitting: Emergency Medicine

## 2014-09-15 DIAGNOSIS — Z9889 Other specified postprocedural states: Secondary | ICD-10-CM | POA: Diagnosis not present

## 2014-09-15 DIAGNOSIS — Z8719 Personal history of other diseases of the digestive system: Secondary | ICD-10-CM | POA: Diagnosis not present

## 2014-09-15 DIAGNOSIS — Z791 Long term (current) use of non-steroidal anti-inflammatories (NSAID): Secondary | ICD-10-CM | POA: Insufficient documentation

## 2014-09-15 DIAGNOSIS — G43909 Migraine, unspecified, not intractable, without status migrainosus: Secondary | ICD-10-CM | POA: Diagnosis not present

## 2014-09-15 DIAGNOSIS — Z79899 Other long term (current) drug therapy: Secondary | ICD-10-CM | POA: Diagnosis not present

## 2014-09-15 DIAGNOSIS — Z87440 Personal history of urinary (tract) infections: Secondary | ICD-10-CM | POA: Diagnosis not present

## 2014-09-15 DIAGNOSIS — Z3202 Encounter for pregnancy test, result negative: Secondary | ICD-10-CM | POA: Diagnosis not present

## 2014-09-15 DIAGNOSIS — R197 Diarrhea, unspecified: Secondary | ICD-10-CM | POA: Diagnosis not present

## 2014-09-15 DIAGNOSIS — E669 Obesity, unspecified: Secondary | ICD-10-CM | POA: Insufficient documentation

## 2014-09-15 DIAGNOSIS — R102 Pelvic and perineal pain: Secondary | ICD-10-CM | POA: Insufficient documentation

## 2014-09-15 DIAGNOSIS — R1031 Right lower quadrant pain: Secondary | ICD-10-CM | POA: Insufficient documentation

## 2014-09-15 DIAGNOSIS — J45909 Unspecified asthma, uncomplicated: Secondary | ICD-10-CM | POA: Diagnosis not present

## 2014-09-15 DIAGNOSIS — Z8659 Personal history of other mental and behavioral disorders: Secondary | ICD-10-CM | POA: Diagnosis not present

## 2014-09-15 DIAGNOSIS — R109 Unspecified abdominal pain: Secondary | ICD-10-CM | POA: Diagnosis present

## 2014-09-15 DIAGNOSIS — N939 Abnormal uterine and vaginal bleeding, unspecified: Secondary | ICD-10-CM | POA: Insufficient documentation

## 2014-09-15 DIAGNOSIS — Z8619 Personal history of other infectious and parasitic diseases: Secondary | ICD-10-CM | POA: Diagnosis not present

## 2014-09-15 LAB — URINALYSIS, ROUTINE W REFLEX MICROSCOPIC
Bilirubin Urine: NEGATIVE
Glucose, UA: NEGATIVE mg/dL
Ketones, ur: NEGATIVE mg/dL
Leukocytes, UA: NEGATIVE
Nitrite: NEGATIVE
PROTEIN: NEGATIVE mg/dL
SPECIFIC GRAVITY, URINE: 1.034 — AB (ref 1.005–1.030)
UROBILINOGEN UA: 1 mg/dL (ref 0.0–1.0)
pH: 5.5 (ref 5.0–8.0)

## 2014-09-15 LAB — BASIC METABOLIC PANEL
Anion gap: 10 (ref 5–15)
BUN: 11 mg/dL (ref 6–23)
CALCIUM: 9 mg/dL (ref 8.4–10.5)
CO2: 25 meq/L (ref 19–32)
Chloride: 105 mEq/L (ref 96–112)
Creatinine, Ser: 0.73 mg/dL (ref 0.50–1.10)
GFR calc Af Amer: >90 mL/min (ref >90)
GFR calc non Af Amer: >90 mL/min (ref >90)
GLUCOSE: 83 mg/dL (ref 70–99)
Potassium: 3.8 mEq/L (ref 3.7–5.3)
Sodium: 140 mEq/L (ref 137–147)

## 2014-09-15 LAB — URINE MICROSCOPIC-ADD ON

## 2014-09-15 LAB — CBC WITH DIFFERENTIAL/PLATELET
Basophils Absolute: 0 10*3/uL (ref 0.0–0.1)
Basophils Relative: 0 % (ref 0–1)
EOS PCT: 1 % (ref 0–5)
Eosinophils Absolute: 0.1 10*3/uL (ref 0.0–0.7)
HCT: 39.4 % (ref 36.0–46.0)
HEMOGLOBIN: 13 g/dL (ref 12.0–15.0)
LYMPHS ABS: 3.4 10*3/uL (ref 0.7–4.0)
LYMPHS PCT: 34 % (ref 12–46)
MCH: 28.8 pg (ref 26.0–34.0)
MCHC: 33 g/dL (ref 30.0–36.0)
MCV: 87.2 fL (ref 78.0–100.0)
MONO ABS: 0.5 10*3/uL (ref 0.1–1.0)
MONOS PCT: 5 % (ref 3–12)
Neutro Abs: 5.9 10*3/uL (ref 1.7–7.7)
Neutrophils Relative %: 60 % (ref 43–77)
Platelets: 294 10*3/uL (ref 150–400)
RBC: 4.52 MIL/uL (ref 3.87–5.11)
RDW: 14.4 % (ref 11.5–15.5)
WBC: 9.9 10*3/uL (ref 4.0–10.5)

## 2014-09-15 LAB — WET PREP, GENITAL
Clue Cells Wet Prep HPF POC: NONE SEEN
Trich, Wet Prep: NONE SEEN
Yeast Wet Prep HPF POC: NONE SEEN

## 2014-09-15 LAB — POC URINE PREG, ED: Preg Test, Ur: NEGATIVE

## 2014-09-15 MED ORDER — IBUPROFEN 800 MG PO TABS: 800.0000 mg | ORAL_TABLET | Freq: Three times a day (TID) | ORAL | Status: DC

## 2014-09-15 MED ORDER — HYDROCODONE-ACETAMINOPHEN 5-325 MG PO TABS: 2.0000 | ORAL_TABLET | ORAL | Status: DC | PRN

## 2014-09-15 MED ORDER — ONDANSETRON HCL 4 MG/2ML IJ SOLN
4.0000 mg | Freq: Once | INTRAMUSCULAR | Status: AC
  Administered 2014-09-15: 4 mg via INTRAVENOUS
  Filled 2014-09-15: qty 2

## 2014-09-15 MED ORDER — IOHEXOL 300 MG/ML  SOLN
100.0000 mL | Freq: Once | INTRAMUSCULAR | Status: AC | PRN
  Administered 2014-09-15: 100 mL via INTRAVENOUS

## 2014-09-15 MED ORDER — IOHEXOL 300 MG/ML  SOLN
25.0000 mL | Freq: Once | INTRAMUSCULAR | Status: AC | PRN
  Administered 2014-09-15: 25 mL via ORAL

## 2014-09-15 MED ORDER — HYDROCODONE-ACETAMINOPHEN 5-325 MG PO TABS
2.0000 | ORAL_TABLET | Freq: Once | ORAL | Status: AC
  Administered 2014-09-15: 2 via ORAL
  Filled 2014-09-15: qty 2

## 2014-09-15 MED ORDER — MORPHINE SULFATE 4 MG/ML IJ SOLN
4.0000 mg | Freq: Once | INTRAMUSCULAR | Status: AC
  Administered 2014-09-15: 4 mg via INTRAVENOUS
  Filled 2014-09-15: qty 1

## 2014-09-15 NOTE — ED Notes (Signed)
Patient returned from CT

## 2014-09-15 NOTE — ED Notes (Signed)
Patient returned from US.

## 2014-09-15 NOTE — ED Notes (Signed)
MD at the bedside  

## 2014-09-15 NOTE — ED Notes (Signed)
Called CT and told patient has finished contrast.

## 2014-09-15 NOTE — Discharge Instructions (Signed)
Pelvic Pain Female pelvic pain can be caused by many different things and start from a variety of places. Pelvic pain refers to pain that is located in the lower half of the abdomen and between your hips. The pain may occur over a short period of time (acute) or may be reoccurring (chronic). The cause of pelvic pain may be related to disorders affecting the female reproductive organs (gynecologic), but it may also be related to the bladder, kidney stones, an intestinal complication, or muscle or skeletal problems. Getting help right away for pelvic pain is important, especially if there has been severe, sharp, or a sudden onset of unusual pain. It is also important to get help right away because some types of pelvic pain can be life threatening.  CAUSES  Below are only some of the causes of pelvic pain. The causes of pelvic pain can be in one of several categories.   Gynecologic.  Pelvic inflammatory disease.  Sexually transmitted infection.  Ovarian cyst or a twisted ovarian ligament (ovarian torsion).  Uterine lining that grows outside the uterus (endometriosis).  Fibroids, cysts, or tumors.  Ovulation.  Pregnancy.  Pregnancy that occurs outside the uterus (ectopic pregnancy).  Miscarriage.  Labor.  Abruption of the placenta or ruptured uterus.  Infection.  Uterine infection (endometritis).  Bladder infection.  Diverticulitis.  Miscarriage related to a uterine infection (septic abortion).  Bladder.  Inflammation of the bladder (cystitis).  Kidney stone(s).  Gastrointestinal.  Constipation.  Diverticulitis.  Neurologic.  Trauma.  Feeling pelvic pain because of mental or emotional causes (psychosomatic).  Cancers of the bowel or pelvis. EVALUATION  Your caregiver will want to take a careful history of your concerns. This includes recent changes in your health, a careful gynecologic history of your periods (menses), and a sexual history. Obtaining your family  history and medical history is also important. Your caregiver may suggest a pelvic exam. A pelvic exam will help identify the location and severity of the pain. It also helps in the evaluation of which organ system may be involved. In order to identify the cause of the pelvic pain and be properly treated, your caregiver may order tests. These tests may include:   A pregnancy test.  Pelvic ultrasonography.  An X-ray exam of the abdomen.  A urinalysis or evaluation of vaginal discharge.  Blood tests. HOME CARE INSTRUCTIONS   Only take over-the-counter or prescription medicines for pain, discomfort, or fever as directed by your caregiver.   Rest as directed by your caregiver.   Eat a balanced diet.   Drink enough fluids to make your urine clear or pale yellow, or as directed.   Avoid sexual intercourse if it causes pain.   Apply warm or cold compresses to the lower abdomen depending on which one helps the pain.   Avoid stressful situations.   Keep a journal of your pelvic pain. Write down when it started, where the pain is located, and if there are things that seem to be associated with the pain, such as food or your menstrual cycle.  Follow up with your caregiver as directed.  SEEK MEDICAL CARE IF:  Your medicine does not help your pain.  You have abnormal vaginal discharge. SEEK IMMEDIATE MEDICAL CARE IF:   You have heavy bleeding from the vagina.   Your pelvic pain increases.   You feel light-headed or faint.   You have chills.   You have pain with urination or blood in your urine.   You have uncontrolled diarrhea   or vomiting.   You have a fever or persistent symptoms for more than 3 days.  You have a fever and your symptoms suddenly get worse.   You are being physically or sexually abused.  MAKE SURE YOU:  Understand these instructions.  Will watch your condition.  Will get help if you are not doing well or get worse. Document Released:  08/28/2004 Document Revised: 02/15/2014 Document Reviewed: 01/21/2012 Exit[employer] Patient Information 2015 [employer]. This information is not intended to replace advice given to you by your health care provider. Make sure you discuss any questions you have with your health care provider.  

## 2014-09-15 NOTE — ED Notes (Addendum)
Per pt sts right pelvic pain.  Denies any urinary symptoms. sts some vaginal spotting.

## 2014-09-15 NOTE — ED Provider Notes (Signed)
CSN: [id number]     Arrival date & time 09/15/14  1515 History   First MD Initiated Contact with Patient 09/15/14 1625     Chief Complaint  Patient presents with  . Abdominal Pain  . Diarrhea     (Consider location/radiation/quality/duration/timing/severity/associated sxs/prior Treatment) HPI Comments: Patient presents with four-day history of intermittent right-sided pelvic pain that feels like cramps. Ibuprofen does not help. Nothing makes it worse. Denies any vomiting, nausea, fever. She has had loose stools but no diarrhea. No fever. Endorses vaginal bleeding which started today after she has not had a period for the past 4 months. Denies any vaginal discharge. No chest pain or shortness of breath. Denies any back pain. She is on Implanon birth control. No history of ovarian cysts.  The history is provided by the patient.    Past Medical History  Diagnosis Date  . Asthma   . Chlamydia 2008  . Gonorrhea   . Irregular periods/menstrual cycles   . Constipation, chronic 11/13/11  . Migraines     Migraines  . Depression     postpartum  . Trichomonas 2008  . Hx: UTI (urinary tract infection)     Frequently  . Obese    Past Surgical History  Procedure Laterality Date  . Wisdom tooth extraction    . Induced abortion     Family History  Problem Relation Age of Onset  . Anesthesia problems Neg Hx   . Hypotension Neg Hx   . Malignant hyperthermia Neg Hx   . Pseudochol deficiency Neg Hx   . Hypertension Mother   . Heart disease Mother   . Diabetes Mother   . Stroke Mother   . Hyperlipidemia Mother   . Hypertension Sister   . Thrombophlebitis Sister     clotting issue  . Depression Sister   . Heart disease Maternal Grandfather   . Drug abuse Father   . Alcohol abuse Maternal Uncle   . Kidney disease Maternal Uncle     failure  . Rheum arthritis Sister     arthritis vs fibromyalgia  . Heart murmur Sister   . Fibromyalgia Sister   . Depression Sister   . Hypertension  Sister    History  Substance Use Topics  . Smoking status: Never Smoker   . Smokeless tobacco: Never Used  . Alcohol Use: Yes     Comment: occasional   OB History    Gravida Para Term Preterm AB TAB SAB Ectopic Multiple Living   3 2 2  0 1 1 0 0 0 2     Review of Systems  Constitutional: Negative for fever, activity change and appetite change.  HENT: Negative for congestion and rhinorrhea.   Respiratory: Negative for cough, chest tightness and shortness of breath.   Cardiovascular: Negative for chest pain.  Gastrointestinal: Positive for abdominal pain and diarrhea. Negative for nausea and vomiting.  Genitourinary: Positive for vaginal bleeding, menstrual problem and pelvic pain. Negative for dysuria, hematuria and vaginal discharge.  Musculoskeletal: Negative for myalgias, back pain and arthralgias.  Skin: Negative for rash.  Neurological: Negative for dizziness, weakness and headaches.  A complete 10 system review of systems was obtained and all systems are negative except as noted in the HPI and PMH.      Allergies  Review of patient's allergies indicates no known allergies.  Home Medications   Prior to Admission medications   Medication Sig Start Date End Date Taking? Authorizing Provider  albuterol (PROAIR HFA) 108 (90 BASE) MCG/ACT  inhaler Inhale 2 puffs into the lungs every 4 (four) hours as needed for wheezing or shortness of breath. For shortness of breath/wheezing    Historical Provider, MD  albuterol (PROVENTIL) (2.5 MG/3ML) 0.083% nebulizer solution Take 3 mLs (2.5 mg total) by nebulization every 6 (six) hours as needed for wheezing or shortness of breath. 01/12/14   Rolland PorterMark James, MD  cetirizine (ZYRTEC) 10 MG tablet Take 10 mg by mouth daily.    Historical Provider, MD  cyclobenzaprine (FLEXERIL) 10 MG tablet Take 1 tablet (10 mg total) by mouth 2 (two) times daily as needed for muscle spasms. 04/15/14   Tatyana A Kirichenko, PA-C  etonogestrel (NEXPLANON) 68 MG IMPL  implant Inject 1 each into the skin once.    Historical Provider, MD  fluticasone (FLONASE) 50 MCG/ACT nasal spray Place 1 spray into both nostrils 2 (two) times daily as needed for allergies or rhinitis.    Historical Provider, MD  HYDROcodone-acetaminophen (NORCO/VICODIN) 5-325 MG per tablet Take 2 tablets by mouth every 4 (four) hours as needed. 09/15/14   Glynn OctaveStephen Mann Skaggs, MD  ibuprofen (ADVIL,MOTRIN) 800 MG tablet Take 1 tablet (800 mg total) by mouth 3 (three) times daily. 09/15/14   Glynn OctaveStephen Finis Hendricksen, MD  metoCLOPramide (REGLAN) 10 MG tablet Take 1 tablet (10 mg total) by mouth every 6 (six) hours as needed (nausea/headache). 09/05/11 09/15/11  Hurman HornJohn M Bednar, MD  naproxen (NAPROSYN) 500 MG tablet Take 1 tablet (500 mg total) by mouth 2 (two) times daily. 04/15/14   Tatyana A Kirichenko, PA-C  traMADol (ULTRAM) 50 MG tablet Take 1 tablet (50 mg total) by mouth every 6 (six) hours as needed. 04/15/14   Tatyana A Kirichenko, PA-C   BP 112/81 mmHg  Pulse 80  Temp(Src) 98.5 F (36.9 C) (Oral)  Resp 16  SpO2 100% Physical Exam  Constitutional: She is oriented to person, place, and time. She appears well-developed and well-nourished. No distress.  HENT:  Head: Normocephalic and atraumatic.  Mouth/Throat: Oropharynx is clear and moist. No oropharyngeal exudate.  Eyes: Conjunctivae and EOM are normal. Pupils are equal, round, and reactive to light.  Neck: Normal range of motion. Neck supple.  No meningismus.  Cardiovascular: Normal rate, regular rhythm, normal heart sounds and intact distal pulses.   No murmur heard. Pulmonary/Chest: Effort normal and breath sounds normal. No respiratory distress.  Abdominal: Soft. There is tenderness. There is no rebound and no guarding.  TTP RLQ. No guarding or rebound  Genitourinary:  Chaperone present. Normal external genitalia. Mild right-sided adnexal tenderness. No CMT. No vaginal bleeding or discharge. No left-sided adnexal tenderness  Musculoskeletal:  Normal range of motion. She exhibits no edema or tenderness.  No CVAT  Neurological: She is alert and oriented to person, place, and time. No cranial nerve deficit. She exhibits normal muscle tone. Coordination normal.  No ataxia on finger to nose bilaterally. No pronator drift. 5/5 strength throughout. CN 2-12 intact. Negative Romberg. Equal grip strength. Sensation intact. Gait is normal.   Skin: Skin is warm.  Psychiatric: She has a normal mood and affect. Her behavior is normal.  Nursing note and vitals reviewed.   ED Course  Procedures (including critical care time) Labs Review Labs Reviewed  WET PREP, GENITAL - Abnormal; Notable for the following:    WBC, Wet Prep HPF POC FEW (*)    All other components within normal limits  URINALYSIS, ROUTINE W REFLEX MICROSCOPIC - Abnormal; Notable for the following:    Color, Urine AMBER (*)    APPearance  CLOUDY (*)    Specific Gravity, Urine 1.034 (*)    Hgb urine dipstick MODERATE (*)    All other components within normal limits  URINE MICROSCOPIC-ADD ON - Abnormal; Notable for the following:    Squamous Epithelial / LPF MANY (*)    All other components within normal limits  GC/CHLAMYDIA PROBE AMP  CBC WITH DIFFERENTIAL  BASIC METABOLIC PANEL  POC URINE PREG, ED    Imaging Review US Transvaginal Non-ob  09/15/2014   CLINICAL DATA:  Pelvic pain for 1 week.  EXAM: TRANSABDOMINAL AND TRANSVAGINAL ULTRASOUND OF PELVIS  DOPPLER ULTRASOUND OF OVARIES  TECHNIQUE: Both transabdominal and transvaginal ultrasound examinations of the pelvis were performed. Transabdominal technique was performed for global imaging of the pelvis including uterus, ovaries, adnexal regions, and pelvic cul-de-sac.  It was necessary to proceed with endovaginal exam following the transabdominal exam to visualize the ovaries and endometrium. Color and duplex Doppler ultrasound was utilized to evaluate blood flow to the ovaries.  COMPARISON:  CT scan 09/15/2014  FINDINGS:  Uterus  Measurements: 8.8 x 4.6 x 5.9 cm. Retroflexed position. No myometrial abnormalities.  Endometrium  Thickness: 4.0 mm.  No focal abnormality visualized.  Right ovary  Measurements: 3.4 x 2.2 x 1.9 cm. Normal appearance/no adnexal mass.  Left ovary  Measurements: 3.8 x 2.3 x 1.5 cm. Normal appearance/no adnexal mass.  Pulsed Doppler evaluation of both ovaries demonstrates normal low-resistance arterial and venous waveforms.  Other findings  Small amount of free pelvic fluid.  IMPRESSION: Unremarkable pelvic ultrasound examination.  Small amount of free pelvic fluid.   Electronically Signed   By: Loralie Champagne M.D.   On: 09/15/2014 20:13   US Pelvis Complete  09/15/2014   CLINICAL DATA:  Pelvic pain for 1 week.  EXAM: TRANSABDOMINAL AND TRANSVAGINAL ULTRASOUND OF PELVIS  DOPPLER ULTRASOUND OF OVARIES  TECHNIQUE: Both transabdominal and transvaginal ultrasound examinations of the pelvis were performed. Transabdominal technique was performed for global imaging of the pelvis including uterus, ovaries, adnexal regions, and pelvic cul-de-sac.  It was necessary to proceed with endovaginal exam following the transabdominal exam to visualize the ovaries and endometrium. Color and duplex Doppler ultrasound was utilized to evaluate blood flow to the ovaries.  COMPARISON:  CT scan 09/15/2014  FINDINGS: Uterus  Measurements: 8.8 x 4.6 x 5.9 cm. Retroflexed position. No myometrial abnormalities.  Endometrium  Thickness: 4.0 mm.  No focal abnormality visualized.  Right ovary  Measurements: 3.4 x 2.2 x 1.9 cm. Normal appearance/no adnexal mass.  Left ovary  Measurements: 3.8 x 2.3 x 1.5 cm. Normal appearance/no adnexal mass.  Pulsed Doppler evaluation of both ovaries demonstrates normal low-resistance arterial and venous waveforms.  Other findings  Small amount of free pelvic fluid.  IMPRESSION: Unremarkable pelvic ultrasound examination.  Small amount of free pelvic fluid.   Electronically Signed   By: Loralie Champagne  M.D.   On: 09/15/2014 20:13   Ct Abdomen Pelvis W Contrast  09/15/2014   CLINICAL DATA:  Four-day history of right lower quadrant pain. Diarrhea  EXAM: CT ABDOMEN AND PELVIS WITH CONTRAST  TECHNIQUE: Multidetector CT imaging of the abdomen and pelvis was performed using the standard protocol following bolus administration of intravenous contrast. Oral contrast was also administered.  CONTRAST:  OMNIPAQUE IOHEXOL 300 MG/ML  SOLN  COMPARISON:  September 12, 2013  FINDINGS: Lung bases are clear.  Liver is prominent, measuring 19.0 cm in length. There is fatty infiltration near the fissure for the ligamentum teres. No focal liver lesions  are identified. Gallbladder wall is not thickened. There is no biliary duct dilatation.  Spleen, pancreas, and adrenals appear normal. Kidneys bilaterally show no mass or hydronephrosis on either side. There is no renal or ureteral calculus on either side.  In the pelvis, the urinary bladder is midline with normal wall thickness. There is a small amount of free fluid in the cul-de-sac. There is no demonstrable pelvic mass.  There is a small ventral hernia containing only fat.  The appendix appears within normal limits. Terminal ileum appears normal. There are several subcentimeter lymph nodes in the right lower quadrant region. By size criteria, there is no adenopathy in the abdomen or pelvis.  There is no bowel obstruction. No free air or portal venous air. There is no abscess in the abdomen or pelvis. There is no demonstrable abdominal aortic aneurysm. There are no blastic or lytic bone lesions.  IMPRESSION: There are a few small right lower quadrant lymph nodes. Question a degree of mesenteric adenitis. There is no periappendiceal region inflammation. No bowel obstruction. No abscess.  Liver prominent with fatty infiltration.  No renal or ureteral calculus.  No hydronephrosis.  Small ventral hernia containing only fat.  Small amount of free fluid in the cul-de-sac. Question  recent ovarian cyst rupture.   Electronically Signed   By: Bretta BangWilliam  Woodruff M.D.   On: 09/15/2014 19:06   [place] Art/ven Flow Abd Pelv Doppler  09/15/2014   CLINICAL DATA:  Pelvic pain for 1 week.  EXAM: TRANSABDOMINAL AND TRANSVAGINAL ULTRASOUND OF PELVIS  DOPPLER ULTRASOUND OF OVARIES  TECHNIQUE: Both transabdominal and transvaginal ultrasound examinations of the pelvis were performed. Transabdominal technique was performed for global imaging of the pelvis including uterus, ovaries, adnexal regions, and pelvic cul-de-sac.  It was necessary to proceed with endovaginal exam following the transabdominal exam to visualize the ovaries and endometrium. Color and duplex Doppler ultrasound was utilized to evaluate blood flow to the ovaries.  COMPARISON:  CT scan 09/15/2014  FINDINGS: Uterus  Measurements: 8.8 x 4.6 x 5.9 cm. Retroflexed position. No myometrial abnormalities.  Endometrium  Thickness: 4.0 mm.  No focal abnormality visualized.  Right ovary  Measurements: 3.4 x 2.2 x 1.9 cm. Normal appearance/no adnexal mass.  Left ovary  Measurements: 3.8 x 2.3 x 1.5 cm. Normal appearance/no adnexal mass.  Pulsed Doppler evaluation of both ovaries demonstrates normal low-resistance arterial and venous waveforms.  Other findings  Small amount of free pelvic fluid.  IMPRESSION: Unremarkable pelvic ultrasound examination.  Small amount of free pelvic fluid.   Electronically Signed   By: Loralie ChampagneMark  Gallerani M.D.   On: 09/15/2014 20:13     EKG Interpretation None      MDM   Final diagnoses:  Pelvic pain in female  RLQ abdominal pain   Intermittent pelvic pain for the past 4 days. No vomitin  HCG negative. UA shows blood consistent with menstrual cycle.  CT scan shows normal appendix. There is mesenteric adenitis. No evidence of ovarian torsion. No evidence of ovarian cyst. There is a small amount of free pelvic fluid likely from recent cyst rupture.  Treat symptomatically for mesenteric adenitis with possible  ovarian cyst. Follow-up with PCP. Return precautions discussed.  BP 112/81 mmHg  Pulse 80  Temp(Src) 98.5 F (36.9 C) (Oral)  Resp 16  SpO2 100%   Glynn OctaveStephen Monay Houlton, MD 09/16/14 [phone]

## 2014-09-16 LAB — GC/CHLAMYDIA PROBE AMP
CT Probe RNA: NEGATIVE
GC Probe RNA: NEGATIVE

## 2014-09-18 ENCOUNTER — Inpatient Hospital Stay (HOSPITAL_COMMUNITY)
Admission: AD | Admit: 2014-09-18 | Discharge: 2014-09-18 | Disposition: A | Discharge status: Home / Self Care | Payer: Medicaid Other | Source: Ambulatory Visit | Attending: Family Medicine | Admitting: Family Medicine

## 2014-09-18 ENCOUNTER — Encounter (HOSPITAL_COMMUNITY): Payer: Self-pay

## 2014-09-18 DIAGNOSIS — N390 Urinary tract infection, site not specified: Secondary | ICD-10-CM | POA: Diagnosis not present

## 2014-09-18 DIAGNOSIS — F17210 Nicotine dependence, cigarettes, uncomplicated: Secondary | ICD-10-CM | POA: Insufficient documentation

## 2014-09-18 DIAGNOSIS — R195 Other fecal abnormalities: Secondary | ICD-10-CM

## 2014-09-18 DIAGNOSIS — N3001 Acute cystitis with hematuria: Secondary | ICD-10-CM

## 2014-09-18 DIAGNOSIS — R1031 Right lower quadrant pain: Secondary | ICD-10-CM | POA: Diagnosis present

## 2014-09-18 LAB — URINALYSIS, ROUTINE W REFLEX MICROSCOPIC
BILIRUBIN URINE: NEGATIVE
Glucose, UA: NEGATIVE mg/dL
HGB URINE DIPSTICK: NEGATIVE
Ketones, ur: NEGATIVE mg/dL
Leukocytes, UA: NEGATIVE
Nitrite: NEGATIVE
PROTEIN: NEGATIVE mg/dL
Specific Gravity, Urine: >1.03 — ABNORMAL HIGH (ref 1.005–1.030)
Urobilinogen, UA: 0.2 mg/dL (ref 0.0–1.0)
pH: 5.5 (ref 5.0–8.0)

## 2014-09-18 LAB — POCT PREGNANCY, URINE: Preg Test, Ur: NEGATIVE

## 2014-09-18 MED ORDER — CIPROFLOXACIN HCL 250 MG PO TABS: 250.0000 mg | ORAL_TABLET | Freq: Two times a day (BID) | ORAL | Status: DC

## 2014-09-18 NOTE — Discharge Instructions (Signed)
Food Choices to Help Relieve Diarrhea °When you have diarrhea, the foods you eat and your eating habits are very important. Choosing the right foods and drinks can help relieve diarrhea. Also, because diarrhea can last up to 7 days, you need to replace lost fluids and electrolytes (such as sodium, potassium, and chloride) in order to help prevent dehydration.  °WHAT GENERAL GUIDELINES DO I NEED TO FOLLOW? °· Slowly drink 1 cup (8 oz) of fluid for each episode of diarrhea. If you are getting enough fluid, your urine will be clear or pale yellow. °· Eat starchy foods. Some good choices include white rice, white toast, pasta, low-fiber cereal, baked potatoes (without the skin), saltine crackers, and bagels. °· Avoid large servings of any cooked vegetables. °· Limit fruit to two servings per day. A serving is ½ cup or 1 small piece. °· Choose foods with less than 2 g of fiber per serving. °· Limit fats to less than 8 tsp (38 g) per day. °· Avoid fried foods. °· Eat foods that have probiotics in them. Probiotics can be found in certain dairy products. °· Avoid foods and beverages that may increase the speed at which food moves through the stomach and intestines (gastrointestinal tract). Things to avoid include: °¨ High-fiber foods, such as dried fruit, raw fruits and vegetables, nuts, seeds, and whole grain foods. °¨ Spicy foods and high-fat foods. °¨ Foods and beverages sweetened with high-fructose corn syrup, honey, or sugar alcohols such as xylitol, sorbitol, and mannitol. °WHAT FOODS ARE RECOMMENDED? °Grains °White rice. White, French, or pita breads (fresh or toasted), including plain rolls, buns, or bagels. White pasta. Saltine, soda, or graham crackers. Pretzels. Low-fiber cereal. Cooked cereals made with water (such as cornmeal, farina, or cream cereals). Plain muffins. Matzo. Melba toast. Zwieback.  °Vegetables °Potatoes (without the skin). Strained tomato and vegetable juices. Most well-cooked and canned  vegetables without seeds. Tender lettuce. °Fruits °Cooked or canned applesauce, apricots, cherries, fruit cocktail, grapefruit, peaches, pears, or plums. Fresh bananas, apples without skin, cherries, grapes, cantaloupe, grapefruit, peaches, oranges, or plums.  °Meat and Other Protein Products °Baked or boiled chicken. Eggs. Tofu. Fish. Seafood. Smooth peanut butter. Ground or well-cooked tender beef, ham, veal, lamb, pork, or poultry.  °Dairy °Plain yogurt, kefir, and unsweetened liquid yogurt. Lactose-free milk, buttermilk, or soy milk. Plain hard cheese. °Beverages °Sport drinks. Clear broths. Diluted fruit juices (except prune). Regular, caffeine-free sodas such as ginger ale. Water. Decaffeinated teas. Oral rehydration solutions. Sugar-free beverages not sweetened with sugar alcohols. °Other °Bouillon, broth, or soups made from recommended foods.  °The items listed above may not be a complete list of recommended foods or beverages. Contact your dietitian for more options. °WHAT FOODS ARE NOT RECOMMENDED? °Grains °Whole grain, whole wheat, bran, or rye breads, rolls, pastas, crackers, and cereals. Wild or brown rice. Cereals that contain more than 2 g of fiber per serving. Corn tortillas or taco shells. Cooked or dry oatmeal. Granola. Popcorn. °Vegetables °Raw vegetables. Cabbage, broccoli, Brussels sprouts, artichokes, baked beans, beet greens, corn, kale, legumes, peas, sweet potatoes, and yams. Potato skins. Cooked spinach and cabbage. °Fruits °Dried fruit, including raisins and dates. Raw fruits. Stewed or dried prunes. Fresh apples with skin, apricots, mangoes, pears, raspberries, and strawberries.  °Meat and Other Protein Products °Chunky peanut butter. Nuts and seeds. Beans and lentils. Bacon.  °Dairy °High-fat cheeses. Milk, chocolate milk, and beverages made with milk, such as milk shakes. Cream. Ice cream. °Sweets and Desserts °Sweet rolls, doughnuts, and sweet breads. Pancakes   and waffles. Fats and  Oils Butter. Cream sauces. Margarine. Salad oils. Plain salad dressings. Olives. Avocados.  Beverages Caffeinated beverages (such as coffee, tea, soda, or energy drinks). Alcoholic beverages. Fruit juices with pulp. Prune juice. Soft drinks sweetened with high-fructose corn syrup or sugar alcohols. Other Coconut. Hot sauce. Chili powder. Mayonnaise. Gravy. Cream-based or milk-based soups.  The items listed above may not be a complete list of foods and beverages to avoid. Contact your dietitian for more information. WHAT SHOULD I DO IF I BECOME DEHYDRATED? Diarrhea can sometimes lead to dehydration. Signs of dehydration include dark urine and dry mouth and skin. If you think you are dehydrated, you should rehydrate with an oral rehydration solution. These solutions can be purchased at pharmacies, retail stores, or online.  Drink -1 cup (120-240 mL) of oral rehydration solution each time you have an episode of diarrhea. If drinking this amount makes your diarrhea worse, try drinking smaller amounts more often. For example, drink 1-3 tsp (5-15 mL) every 5-10 minutes.  A general rule for staying hydrated is to drink 1-2 L of fluid per day. Talk to your health care provider about the specific amount you should be drinking each day. Drink enough fluids to keep your urine clear or pale yellow. Document Released: 12/22/2003 Document Revised: 10/06/2013 Document Reviewed: 08/24/2013 [facility] Patient Information 2015 [place], [place]. This information is not intended to replace advice given to you by your health care provider. Make sure you discuss any questions you have with your health care provider. Urinary Tract Infection Urinary tract infections (UTIs) can develop anywhere along your urinary tract. Your urinary tract is your body's drainage system for removing wastes and extra water. Your urinary tract includes two kidneys, two ureters, a bladder, and a urethra. Your kidneys are a pair of bean-shaped  organs. Each kidney is about the size of your fist. They are located below your ribs, one on each side of your spine. CAUSES Infections are caused by microbes, which are microscopic organisms, including fungi, viruses, and bacteria. These organisms are so small that they can only be seen through a microscope. Bacteria are the microbes that most commonly cause UTIs. SYMPTOMS  Symptoms of UTIs may vary by age and gender of the patient and by the location of the infection. Symptoms in young women typically include a frequent and intense urge to urinate and a painful, burning feeling in the bladder or urethra during urination. Older women and men are more likely to be tired, shaky, and weak and have muscle aches and abdominal pain. A fever may mean the infection is in your kidneys. Other symptoms of a kidney infection include pain in your back or sides below the ribs, nausea, and vomiting. DIAGNOSIS To diagnose a UTI, your caregiver will ask you about your symptoms. Your caregiver also will ask to provide a urine sample. The urine sample will be tested for bacteria and white blood cells. White blood cells are made by your body to help fight infection. TREATMENT  Typically, UTIs can be treated with medication. Because most UTIs are caused by a bacterial infection, they usually can be treated with the use of antibiotics. The choice of antibiotic and length of treatment depend on your symptoms and the type of bacteria causing your infection. HOME CARE INSTRUCTIONS  If you were prescribed antibiotics, take them exactly as your caregiver instructs you. Finish the medication even if you feel better after you have only taken some of the medication.  Drink enough water and  fluids to keep your urine clear or pale yellow.  Avoid caffeine, tea, and carbonated beverages. They tend to irritate your bladder.  Empty your bladder often. Avoid holding urine for long periods of time.  Empty your bladder before and  after sexual intercourse.  After a bowel movement, women should cleanse from front to back. Use each tissue only once. SEEK MEDICAL CARE IF:   You have back pain.  You develop a fever.  Your symptoms do not begin to resolve within 3 days. SEEK IMMEDIATE MEDICAL CARE IF:   You have severe back pain or lower abdominal pain.  You develop chills.  You have nausea or vomiting.  You have continued burning or discomfort with urination. MAKE SURE YOU:   Understand these instructions.  Will watch your condition.  Will get help right away if you are not doing well or get worse. Document Released: 07/11/2005 Document Revised: 04/01/2012 Document Reviewed: 11/09/2011 [facility] Patient Information 2015 [place], [place]. This information is not intended to replace advice given to you by your health care provider. Make sure you discuss any questions you have with your health care provider.

## 2014-09-18 NOTE — MAU Note (Addendum)
Pt presents complaining [facility] lower [facility] States the pain is still bad. States she took ibuprofen and hydrocodone at 0100 this am. Pt also complaining of nausea. Pt currently has Nexplanon. Denies vaginal bleeding or discharge today. Pt still on cell phone

## 2014-09-18 NOTE — MAU Note (Signed)
Arrived in pt room to assess pt and get a history of present complaints. Pt on cell phone and not acknowledging questions asked of her. Vital signs obtained and stable. Informed pt that I would come back when she was finished with her phone call.

## 2014-09-18 NOTE — MAU Provider Note (Signed)
History     CSN: [id number]  Arrival date and time: 09/18/14 1054   First Provider Initiated Contact with Patient 09/18/14 1149      Chief Complaint  Patient presents with  . Abdominal Pain   HPI  [name] is a 23 y.o. [id number] who presents to MAU today with complaint of RLQ abdominal pain that radiates to her lower back x 1 week. She was at [facility] on 09/16/14 and had full evaluation including pelvic US and CT scan of abdomen and pelvis. Patient states that she was told she had ovarian cysts and that one had ruptured. She was given Vicodin and Ibuprofen for pain. She states last dose at 0100 today. She feels that pain has continued. She denies fever, dysuria, frequency or urgency. She states she noted hematuria earlier this week and also endorses right sided flank pain. She states frequent loose stools x 1 week as well with last BM last night.   OB History    Gravida Para Term Preterm AB TAB SAB Ectopic Multiple Living   3 2 2  0 1 1 0 0 0 2      Past Medical History  Diagnosis Date  . Asthma   . Chlamydia 2008  . Gonorrhea   . Irregular periods/menstrual cycles   . Constipation, chronic 11/13/11  . Migraines     Migraines  . Depression     postpartum  . Trichomonas 2008  . Hx: UTI (urinary tract infection)     Frequently  . Obese     Past Surgical History  Procedure Laterality Date  . Wisdom tooth extraction    . Induced abortion      Family History  Problem Relation Age of Onset  . Anesthesia problems Neg Hx   . Hypotension Neg Hx   . Malignant hyperthermia Neg Hx   . Pseudochol deficiency Neg Hx   . Hypertension Mother   . Heart disease Mother   . Diabetes Mother   . Stroke Mother   . Hyperlipidemia Mother   . Hypertension Sister   . Thrombophlebitis Sister     clotting issue  . Depression Sister   . Heart disease Maternal Grandfather   . Drug abuse Father   . Alcohol abuse Maternal Uncle   . Kidney disease Maternal Uncle     failure  . Rheum  arthritis Sister     arthritis vs fibromyalgia  . Heart murmur Sister   . Fibromyalgia Sister   . Depression Sister   . Hypertension Sister     History  Substance Use Topics  . Smoking status: Current Every Day Smoker    Types: Cigarettes  . Smokeless tobacco: Never Used  . Alcohol Use: Yes     Comment: occasional    Allergies: No Known Allergies  Prescriptions prior to admission  Medication Sig Dispense Refill Last Dose  . albuterol (PROAIR HFA) 108 (90 BASE) MCG/ACT inhaler Inhale 2 puffs into the lungs every 4 (four) hours as needed for wheezing or shortness of breath. For shortness of breath/wheezing   Past Week at Unknown time  . albuterol (PROVENTIL) (2.5 MG/3ML) 0.083% nebulizer solution Take 3 mLs (2.5 mg total) by nebulization every 6 (six) hours as needed for wheezing or shortness of breath. 75 mL 12 Past Week at Unknown time  . cetirizine (ZYRTEC) 10 MG tablet Take 10 mg by mouth daily.   04/15/2014 at Unknown time  . cyclobenzaprine (FLEXERIL) 10 MG tablet Take 1 tablet (10 mg total)  by mouth 2 (two) times daily as needed for muscle spasms. 20 tablet 0   . etonogestrel (NEXPLANON) 68 MG IMPL implant Inject 1 each into the skin once.   2013  . fluticasone (FLONASE) 50 MCG/ACT nasal spray Place 1 spray into both nostrils 2 (two) times daily as needed for allergies or rhinitis.   Past Month at Unknown time  . HYDROcodone-acetaminophen (NORCO/VICODIN) 5-325 MG per tablet Take 2 tablets by mouth every 4 (four) hours as needed. 10 tablet 0   . ibuprofen (ADVIL,MOTRIN) 800 MG tablet Take 1 tablet (800 mg total) by mouth 3 (three) times daily. 21 tablet 0   . metoCLOPramide (REGLAN) 10 MG tablet Take 1 tablet (10 mg total) by mouth every 6 (six) hours as needed (nausea/headache). 6 tablet 0   . naproxen (NAPROSYN) 500 MG tablet Take 1 tablet (500 mg total) by mouth 2 (two) times daily. 30 tablet 0   . traMADol (ULTRAM) 50 MG tablet Take 1 tablet (50 mg total) by mouth every 6 (six)  hours as needed. 15 tablet 0     Review of Systems  Constitutional: Negative for fever and malaise/fatigue.  Gastrointestinal: Positive for nausea, abdominal pain and diarrhea. Negative for vomiting, constipation and blood in stool.  Genitourinary: Positive for hematuria and flank pain. Negative for dysuria, urgency and frequency.       Neg - vaginal bleeding, discharge   Physical Exam   Blood pressure 122/76, pulse 91, temperature 98 F (36.7 C), temperature source Oral, resp. rate 18.  Physical Exam  Constitutional: She is oriented to person, place, and time. She appears well-developed and well-nourished. No distress.  HENT:  Head: Normocephalic.  Cardiovascular: Normal rate.   Respiratory: Effort normal.  GI: Soft. She exhibits no distension and no mass. There is no tenderness. There is no rebound and no guarding.  Neurological: She is alert and oriented to person, place, and time.  Skin: Skin is warm and dry. No erythema.  Psychiatric: She has a normal mood and affect.   Results for orders placed or performed during the hospital encounter of 09/18/14 (from the past 24 hour(s))  Urinalysis, Routine w reflex microscopic     Status: Abnormal   Collection Time: 09/18/14 11:00 AM  Result Value Ref Range   Color, Urine YELLOW YELLOW   APPearance CLEAR CLEAR   Specific Gravity, Urine >1.030 (H) 1.005 - 1.030   pH 5.5 5.0 - 8.0   Glucose, UA NEGATIVE NEGATIVE mg/dL   Hgb urine dipstick NEGATIVE NEGATIVE   Bilirubin Urine NEGATIVE NEGATIVE   Ketones, ur NEGATIVE NEGATIVE mg/dL   Protein, ur NEGATIVE NEGATIVE mg/dL   Urobilinogen, UA 0.2 0.0 - 1.0 mg/dL   Nitrite NEGATIVE NEGATIVE   Leukocytes, UA NEGATIVE NEGATIVE  Pregnancy, urine POC     Status: None   Collection Time: 09/18/14 11:13 AM  Result Value Ref Range   Preg Test, Ur NEGATIVE NEGATIVE   [place] Transvaginal Non-ob  09/15/2014   CLINICAL DATA:  Pelvic pain for 1 week.  EXAM: TRANSABDOMINAL AND TRANSVAGINAL ULTRASOUND OF  PELVIS  DOPPLER ULTRASOUND OF OVARIES  TECHNIQUE: Both transabdominal and transvaginal ultrasound examinations of the pelvis were performed. Transabdominal technique was performed for global imaging of the pelvis including uterus, ovaries, adnexal regions, and pelvic cul-de-sac.  It was necessary to proceed with endovaginal exam following the transabdominal exam to visualize the ovaries and endometrium. Color and duplex Doppler ultrasound was utilized to evaluate blood flow to the ovaries.  COMPARISON:  CT  scan 09/15/2014  FINDINGS: Uterus  Measurements: 8.8 x 4.6 x 5.9 cm. Retroflexed position. No myometrial abnormalities.  Endometrium  Thickness: 4.0 mm.  No focal abnormality visualized.  Right ovary  Measurements: 3.4 x 2.2 x 1.9 cm. Normal appearance/no adnexal mass.  Left ovary  Measurements: 3.8 x 2.3 x 1.5 cm. Normal appearance/no adnexal mass.  Pulsed Doppler evaluation of both ovaries demonstrates normal low-resistance arterial and venous waveforms.  Other findings  Small amount of free pelvic fluid.  IMPRESSION: Unremarkable pelvic ultrasound examination.  Small amount of free pelvic fluid.   Electronically Signed   By: Loralie ChampagneMark  Gallerani M.D.   On: 09/15/2014 20:13   [place] Pelvis Complete  09/15/2014   CLINICAL DATA:  Pelvic pain for 1 week.  EXAM: TRANSABDOMINAL AND TRANSVAGINAL ULTRASOUND OF PELVIS  DOPPLER ULTRASOUND OF OVARIES  TECHNIQUE: Both transabdominal and transvaginal ultrasound examinations of the pelvis were performed. Transabdominal technique was performed for global imaging of the pelvis including uterus, ovaries, adnexal regions, and pelvic cul-de-sac.  It was necessary to proceed with endovaginal exam following the transabdominal exam to visualize the ovaries and endometrium. Color and duplex Doppler ultrasound was utilized to evaluate blood flow to the ovaries.  COMPARISON:  CT scan 09/15/2014  FINDINGS: Uterus  Measurements: 8.8 x 4.6 x 5.9 cm. Retroflexed position. No myometrial  abnormalities.  Endometrium  Thickness: 4.0 mm.  No focal abnormality visualized.  Right ovary  Measurements: 3.4 x 2.2 x 1.9 cm. Normal appearance/no adnexal mass.  Left ovary  Measurements: 3.8 x 2.3 x 1.5 cm. Normal appearance/no adnexal mass.  Pulsed Doppler evaluation of both ovaries demonstrates normal low-resistance arterial and venous waveforms.  Other findings  Small amount of free pelvic fluid.  IMPRESSION: Unremarkable pelvic ultrasound examination.  Small amount of free pelvic fluid.   Electronically Signed   By: Loralie ChampagneMark  Gallerani M.D.   On: 09/15/2014 20:13   Ct Abdomen Pelvis W Contrast  09/15/2014   CLINICAL DATA:  Four-day history of right lower quadrant pain. Diarrhea  EXAM: CT ABDOMEN AND PELVIS WITH CONTRAST  TECHNIQUE: Multidetector CT imaging of the abdomen and pelvis was performed using the standard protocol following bolus administration of intravenous contrast. Oral contrast was also administered.  CONTRAST:  100mL OMNIPAQUE IOHEXOL 300 MG/ML  SOLN  COMPARISON:  September 12, 2013  FINDINGS: Lung bases are clear.  Liver is prominent, measuring 19.0 cm in length. There is fatty infiltration near the fissure for the ligamentum teres. No focal liver lesions are identified. Gallbladder wall is not thickened. There is no biliary duct dilatation.  Spleen, pancreas, and adrenals appear normal. Kidneys bilaterally show no mass or hydronephrosis on either side. There is no renal or ureteral calculus on either side.  In the pelvis, the urinary bladder is midline with normal wall thickness. There is a small amount of free fluid in the cul-de-sac. There is no demonstrable pelvic mass.  There is a small ventral hernia containing only fat.  The appendix appears within normal limits. Terminal ileum appears normal. There are several subcentimeter lymph nodes in the right lower quadrant region. By size criteria, there is no adenopathy in the abdomen or pelvis.  There is no bowel obstruction. No free air or  portal venous air. There is no abscess in the abdomen or pelvis. There is no demonstrable abdominal aortic aneurysm. There are no blastic or lytic bone lesions.  IMPRESSION: There are a few small right lower quadrant lymph nodes. Question a degree of mesenteric adenitis. There is no  periappendiceal region inflammation. No bowel obstruction. No abscess.  Liver prominent with fatty infiltration.  No renal or ureteral calculus.  No hydronephrosis.  Small ventral hernia containing only fat.  Small amount of free fluid in the cul-de-sac. Question recent ovarian cyst rupture.   Electronically Signed   By: Bretta Bang M.D.   On: 09/15/2014 19:06   [place] Art/ven Flow Abd Pelv Doppler  09/15/2014   CLINICAL DATA:  Pelvic pain for 1 week.  EXAM: TRANSABDOMINAL AND TRANSVAGINAL ULTRASOUND OF PELVIS  DOPPLER ULTRASOUND OF OVARIES  TECHNIQUE: Both transabdominal and transvaginal ultrasound examinations of the pelvis were performed. Transabdominal technique was performed for global imaging of the pelvis including uterus, ovaries, adnexal regions, and pelvic cul-de-sac.  It was necessary to proceed with endovaginal exam following the transabdominal exam to visualize the ovaries and endometrium. Color and duplex Doppler ultrasound was utilized to evaluate blood flow to the ovaries.  COMPARISON:  CT scan 09/15/2014  FINDINGS: Uterus  Measurements: 8.8 x 4.6 x 5.9 cm. Retroflexed position. No myometrial abnormalities.  Endometrium  Thickness: 4.0 mm.  No focal abnormality visualized.  Right ovary  Measurements: 3.4 x 2.2 x 1.9 cm. Normal appearance/no adnexal mass.  Left ovary  Measurements: 3.8 x 2.3 x 1.5 cm. Normal appearance/no adnexal mass.  Pulsed Doppler evaluation of both ovaries demonstrates normal low-resistance arterial and venous waveforms.  Other findings  Small amount of free pelvic fluid.  IMPRESSION: Unremarkable pelvic ultrasound examination.  Small amount of free pelvic fluid.   Electronically Signed   By:  Loralie Champagne M.D.   On: 09/15/2014 20:13      MAU Course  Procedures None  MDM UPT - negative UA today Urine culture pending  Assessment and Plan  A: UTI Loose stools  P: Discharge home Rx for Cipro sent to patient's pharmacy Urine culture pending Patient advised to take Imodium over the counter as direceted for loose stools Patient encouraged to follow-up with PCP if symptoms persist or worsen. Patient is a current patient with [facility] Patient may return to MAU as needed or if her condition were to change or worsen   Marny Lowenstein, PA-C  09/18/2014, 11:49 AM

## 2014-09-19 LAB — URINE CULTURE: Colony Count: 50000

## 2014-12-13 ENCOUNTER — Emergency Department (HOSPITAL_COMMUNITY)
Admission: EM | Admit: 2014-12-13 | Discharge: 2014-12-13 | Disposition: A | Discharge status: Home / Self Care | Payer: Medicaid Other | Attending: Emergency Medicine | Admitting: Emergency Medicine

## 2014-12-13 ENCOUNTER — Encounter (HOSPITAL_COMMUNITY): Payer: Self-pay

## 2014-12-13 DIAGNOSIS — Z8619 Personal history of other infectious and parasitic diseases: Secondary | ICD-10-CM | POA: Diagnosis not present

## 2014-12-13 DIAGNOSIS — Z7952 Long term (current) use of systemic steroids: Secondary | ICD-10-CM | POA: Insufficient documentation

## 2014-12-13 DIAGNOSIS — Z87440 Personal history of urinary (tract) infections: Secondary | ICD-10-CM | POA: Diagnosis not present

## 2014-12-13 DIAGNOSIS — F329 Major depressive disorder, single episode, unspecified: Secondary | ICD-10-CM | POA: Diagnosis not present

## 2014-12-13 DIAGNOSIS — R55 Syncope and collapse: Secondary | ICD-10-CM | POA: Insufficient documentation

## 2014-12-13 DIAGNOSIS — Z872 Personal history of diseases of the skin and subcutaneous tissue: Secondary | ICD-10-CM | POA: Insufficient documentation

## 2014-12-13 DIAGNOSIS — Z791 Long term (current) use of non-steroidal anti-inflammatories (NSAID): Secondary | ICD-10-CM | POA: Insufficient documentation

## 2014-12-13 DIAGNOSIS — Z792 Long term (current) use of antibiotics: Secondary | ICD-10-CM | POA: Insufficient documentation

## 2014-12-13 DIAGNOSIS — E669 Obesity, unspecified: Secondary | ICD-10-CM | POA: Diagnosis not present

## 2014-12-13 DIAGNOSIS — Z79899 Other long term (current) drug therapy: Secondary | ICD-10-CM | POA: Diagnosis not present

## 2014-12-13 DIAGNOSIS — J45909 Unspecified asthma, uncomplicated: Secondary | ICD-10-CM | POA: Diagnosis not present

## 2014-12-13 LAB — URINALYSIS, ROUTINE W REFLEX MICROSCOPIC
Bilirubin Urine: NEGATIVE
Glucose, UA: NEGATIVE mg/dL
Ketones, ur: NEGATIVE mg/dL
Leukocytes, UA: NEGATIVE
Nitrite: NEGATIVE
PROTEIN: NEGATIVE mg/dL
SPECIFIC GRAVITY, URINE: 1.019 (ref 1.005–1.030)
UROBILINOGEN UA: 0.2 mg/dL (ref 0.0–1.0)
pH: 6.5 (ref 5.0–8.0)

## 2014-12-13 LAB — BASIC METABOLIC PANEL
Anion gap: 9 (ref 5–15)
BUN: 9 mg/dL (ref 6–23)
CO2: 21 mmol/L (ref 19–32)
Calcium: 8.8 mg/dL (ref 8.4–10.5)
Chloride: 108 mmol/L (ref 96–112)
Creatinine, Ser: 0.68 mg/dL (ref 0.50–1.10)
GFR calc non Af Amer: >90 mL/min (ref >90)
GLUCOSE: 75 mg/dL (ref 70–99)
Potassium: 3.8 mmol/L (ref 3.5–5.1)
Sodium: 138 mmol/L (ref 135–145)

## 2014-12-13 LAB — CBC
HEMATOCRIT: 41.1 % (ref 36.0–46.0)
HEMOGLOBIN: 13.4 g/dL (ref 12.0–15.0)
MCH: 28.5 pg (ref 26.0–34.0)
MCHC: 32.6 g/dL (ref 30.0–36.0)
MCV: 87.4 fL (ref 78.0–100.0)
Platelets: 276 10*3/uL (ref 150–400)
RBC: 4.7 MIL/uL (ref 3.87–5.11)
RDW: 14.7 % (ref 11.5–15.5)
WBC: 7.8 10*3/uL (ref 4.0–10.5)

## 2014-12-13 LAB — I-STAT BETA HCG BLOOD, ED (MC, WL, AP ONLY)

## 2014-12-13 LAB — URINE MICROSCOPIC-ADD ON

## 2014-12-13 LAB — I-STAT TROPONIN, ED: Troponin i, poc: 0.01 ng/mL (ref 0.00–0.08)

## 2014-12-13 MED ORDER — SODIUM CHLORIDE 0.9 % IV BOLUS (SEPSIS)
1000.0000 mL | Freq: Once | INTRAVENOUS | Status: AC
  Administered 2014-12-13: 1000 mL via INTRAVENOUS

## 2014-12-13 NOTE — Discharge Instructions (Signed)
Near-Syncope Near-syncope (commonly known as near fainting) is sudden weakness, dizziness, or feeling like you might pass out. During an episode of near-syncope, you may also develop pale skin, have tunnel vision, or feel sick to your stomach (nauseous). Near-syncope may occur when getting up after sitting or while standing for a long time. It is caused by a sudden decrease in blood flow to the brain. This decrease can result from various causes or triggers, most of which are not serious. However, because near-syncope can sometimes be a sign of something serious, a medical evaluation is required. The specific cause is often not determined. HOME CARE INSTRUCTIONS  Monitor your condition for any changes. The following actions may help to alleviate any discomfort you are experiencing:  Have someone stay with you until you feel stable.  Lie down right away and prop your feet up if you start feeling like you might faint. Breathe deeply and steadily. Wait until all the symptoms have passed. Most of these episodes last only a few minutes. You may feel tired for several hours.   Drink enough fluids to keep your urine clear or pale yellow.   If you are taking blood pressure or heart medicine, get up slowly when seated or lying down. Take several minutes to sit and then stand. This can reduce dizziness.  Follow up with your health care provider as directed. SEEK IMMEDIATE MEDICAL CARE IF:   You have a severe headache.   You have unusual pain in the chest, abdomen, or back.   You are bleeding from the mouth or rectum, or you have black or tarry stool.   You have an irregular or very fast heartbeat.   You have repeated fainting or have seizure-like jerking during an episode.   You faint when sitting or lying down.   You have confusion.   You have difficulty walking.   You have severe weakness.   You have vision problems.  MAKE SURE YOU:   Understand these instructions.  Will  watch your condition.  Will get help right away if you are not doing well or get worse. Document Released: 10/01/2005 Document Revised: 10/06/2013 Document Reviewed: 03/06/2013 ExitCare Patient Information 2015 [employer]. This information is not intended to replace advice given to you by your health care provider. Make sure you discuss any questions you have with your health care provider.  

## 2014-12-13 NOTE — ED Provider Notes (Signed)
CSN: [id number]     Arrival date & time 12/13/14  1208 History   First MD Initiated Contact with Patient 12/13/14 1415     Chief Complaint  Patient presents with  . Near Syncope     (Consider location/radiation/quality/duration/timing/severity/associated sxs/prior Treatment) HPI Margaret Cline is a 24 year old female with past medical history of asthma who presents the ER complaining of 2 syncopal episodes. 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 Patient reports waking up on the floor afterward feeling better. Patient reports a similar episode today in which she was walking into work and experienced feeling flushed again, the procedure feel lightheaded and had to sit down. Patient states after sitting down this helped her symptoms resolve, may soon resolved completely. Patient reports for the past week she has had extreme difficulty sleeping, and states she has only been able to sleep several hours in the past several days. She states she also reports poor by mouth intake past several days. Patient denies associated headache, blurred vision, weakness, chest pain, shortness of breath, palpitations, nausea, vomiting, abdominal pain, diarrhea, dysuria.  Past Medical History  Diagnosis Date  . Asthma   . Chlamydia 2008  . Gonorrhea   . Irregular periods/menstrual cycles   . Constipation, chronic 11/13/11  . Migraines     Migraines  . Depression     postpartum  . Trichomonas 2008  . Hx: UTI (urinary tract infection)     Frequently  . Obese    Past Surgical History  Procedure Laterality Date  . Wisdom tooth extraction    . Induced abortion     Family History  Problem Relation Age of Onset  . Anesthesia problems Neg Hx   . Hypotension Neg Hx   . Malignant hyperthermia Neg Hx   . Pseudochol deficiency Neg Hx   . Hypertension Mother   . Heart disease Mother   . Diabetes Mother   . Stroke Mother   .  Hyperlipidemia Mother   . Hypertension Sister   . Thrombophlebitis Sister     clotting issue  . Depression Sister   . Heart disease Maternal Grandfather   . Drug abuse Father   . Alcohol abuse Maternal Uncle   . Kidney disease Maternal Uncle     failure  . Rheum arthritis Sister     arthritis vs fibromyalgia  . Heart murmur Sister   . Fibromyalgia Sister   . Depression Sister   . Hypertension Sister    History  Substance Use Topics  . Smoking status: Current Every Day Smoker    Types: Cigarettes  . Smokeless tobacco: Never Used  . Alcohol Use: Yes     Comment: occasional   OB History    Gravida Para Term Preterm AB TAB SAB Ectopic Multiple Living   0 1 1 0 0 0 2     Review of Systems  Constitutional: Negative for fever.  HENT: Negative for trouble swallowing.   Eyes: Negative for visual disturbance.  Respiratory: Negative for shortness of breath.   Cardiovascular: Negative for chest pain.  Gastrointestinal: Negative for nausea, vomiting and abdominal pain.  Genitourinary: Negative for dysuria.  Musculoskeletal: Negative for neck pain.  Skin: Negative for rash.  Neurological: Positive for syncope. Negative for dizziness, weakness and numbness.  Psychiatric/Behavioral: Negative.       Allergies  Review of patient's allergies indicates no known allergies.  Home Medications  Prior to Admission medications   Medication Sig Start Date End Date Taking? Authorizing Provider  albuterol (PROAIR HFA) 108 (90 BASE) MCG/ACT inhaler Inhale 2 puffs into the lungs every 4 (four) hours as needed for wheezing or shortness of breath. For shortness of breath/wheezing    Historical Provider, MD  albuterol (PROVENTIL) (2.5 MG/3ML) 0.083% nebulizer solution Take 3 mLs (2.5 mg total) by nebulization every 6 (six) hours as needed for wheezing or shortness of breath. 01/12/14   Rolland Porter, MD  cetirizine (ZYRTEC) 10 MG tablet Take 10 mg by mouth daily.    Historical Provider, MD   ciprofloxacin (CIPRO) 250 MG tablet Take 1 tablet (250 mg total) by mouth every 12 (twelve) hours. 09/18/14   Marny Lowenstein, PA-C  cyclobenzaprine (FLEXERIL) 10 MG tablet Take 1 tablet (10 mg total) by mouth 2 (two) times daily as needed for muscle spasms. 04/15/14   Tatyana A Kirichenko, PA-C  etonogestrel (NEXPLANON) 68 MG IMPL implant Inject 1 each into the skin once.    Historical Provider, MD  fluticasone (FLONASE) 50 MCG/ACT nasal spray Place 1 spray into both nostrils 2 (two) times daily as needed for allergies or rhinitis.    Historical Provider, MD  HYDROcodone-acetaminophen (NORCO/VICODIN) 5-325 MG per tablet Take 2 tablets by mouth every 4 (four) hours as needed. 09/15/14   Glynn Octave, MD  ibuprofen (ADVIL,MOTRIN) 800 MG tablet Take 1 tablet (800 mg total) by mouth 3 (three) times daily. 09/15/14   Glynn Octave, MD  metoCLOPramide (REGLAN) 10 MG tablet Take 1 tablet (10 mg total) by mouth every 6 (six) hours as needed (nausea/headache). 09/05/11 09/15/11  Hurman Horn, MD  naproxen (NAPROSYN) 500 MG tablet Take 1 tablet (500 mg total) by mouth 2 (two) times daily. 04/15/14   Tatyana A Kirichenko, PA-C  traMADol (ULTRAM) 50 MG tablet Take 1 tablet (50 mg total) by mouth every 6 (six) hours as needed. 04/15/14   Tatyana A Kirichenko, PA-C   BP 102/57 mmHg  Pulse 67  Temp(Src) 98.4 F (36.9 C) (Oral)  Resp 16  Ht  (1.702 m)  Wt 248 lb (112.492 kg)  BMI 38.83 kg/m2  SpO2 100% Physical Exam  Constitutional: She is oriented to person, place, and time. She appears well-developed and well-nourished. No distress.  HENT:  Head: Normocephalic and atraumatic.  Mouth/Throat: Oropharynx is clear and moist. No oropharyngeal exudate.  Eyes: Right eye exhibits no discharge. Left eye exhibits no discharge. No scleral icterus.  Neck: Normal range of motion.  Cardiovascular: Normal rate, regular rhythm and normal heart sounds.   No murmur heard. Pulmonary/Chest: Effort normal and breath  sounds normal. No respiratory distress.  Abdominal: Soft. There is no tenderness.  Musculoskeletal: Normal range of motion. She exhibits no edema or tenderness.  Neurological: She is alert and oriented to person, place, and time. She has normal strength. No cranial nerve deficit or sensory deficit. Coordination normal. GCS eye subscore is 4. GCS verbal subscore is 5. GCS motor subscore is 6.  Patient fully alert, answering questions appropriately in full, clear sentences. Cranial nerves II through XII grossly intact. Motor strength 5 out of 5 in all major muscle groups of upper lip surface. Distal sensation intact.  Skin: Skin is warm and dry. No rash noted. She is not diaphoretic.  Psychiatric: She has a normal mood and affect.  Nursing note and vitals reviewed.   ED Course  Procedures (including critical care time) Labs Review Labs Reviewed  URINALYSIS, ROUTINE W REFLEX  MICROSCOPIC - Abnormal; Notable for the following:    Hgb urine dipstick LARGE (*)    All other components within normal limits  URINE MICROSCOPIC-ADD ON - Abnormal; Notable for the following:    Squamous Epithelial / LPF FEW (*)    Bacteria, UA FEW (*)    All other components within normal limits  URINE CULTURE  CBC  BASIC METABOLIC PANEL  I-STAT BETA HCG BLOOD, ED ([facility], [facility], [facility] ONLY)  I-STAT TROPOININ, ED    Imaging Review No results found.   EKG Interpretation   Date/Time:  Monday December 13 2014 12:16:31 EST Ventricular Rate:  82 PR Interval:  146 QRS Duration: 72 QT Interval:  368 QTC Calculation: 429 R Axis:   65 Text Interpretation:  Normal sinus rhythm with sinus arrhythmia Normal ECG  Confirmed by Lincoln Brighamees, Liz [phone]([id number]) on 12/13/2014 1:30:00 PM      MDM   Final diagnoses:  Near syncope    Patient here for near single episode today. Patient stating for the past week she has been unable to sleep, feels this may be contributing to her to her near syncopal episode today. Patient states insomnia and  poor by mouth intake the past week. Patient rehydrated here, with mild improvement of her blood pressure. Patient stating she is feeling better after by mouth challenge and IV fluids here. Patient's lab work reassuring, glucose normal. No leukocytosis or anemia. Renal function within normal limits. No electrolyte abnormalities. UA with few bacteria and few squamous epithelial cells along with large hemoglobin--suspect contaminated catch is patient states she is currently on her menses. With patient continued to deny any shortness of breath chest discomfort, doubt PE--Wells criteria 0.0 for PE. No concern for ACS or cardiac abnormalities. EKG unremarkable for acute ectopy or injury. Patient with negative troponin. Patient afebrile, non-tachycardic, nontachypneic, non-hypoxic, well-appearing in no acute distress. Discussed the importance of oral hydration, recommended patient try using over-the-counter Benadryl to help sleep, and strongly recommended her to follow-up with her primary care physician. I discussed return precautions with patient, and patient verbalizes understanding and agreement with this plan. I encouraged patient to call or return to the ER should she have any questions or concerns.  BP 102/57 mmHg  Pulse 67  Temp(Src) 98.4 F (36.9 C) (Oral)  Resp 16  Ht 5\' 7"  (1.702 m)  Wt 248 lb (112.492 kg)  BMI 38.83 kg/m2  SpO2 100%  Signed,  Ladona MowJoe Annlouise Gerety, PA-C 6:17 PM  Patient discussed with Dr. Tilden FossaElizabeth Rees, MD  Monte FantasiaJoseph W Peregrine Nolt, PA-C 12/13/14 1817  Tilden FossaElizabeth Rees, MD 12/14/14 [phone]

## 2014-12-13 NOTE — ED Notes (Signed)
Pt comfortable with discharge and follow up instructions. No prescriptions. 

## 2014-12-13 NOTE — ED Notes (Signed)
Ambulated to restroom  

## 2014-12-13 NOTE — ED Notes (Signed)
Patient here for syncope since last night x 2. headahce and light headedness.

## 2014-12-15 LAB — URINE CULTURE
CULTURE: NO GROWTH
Colony Count: NO GROWTH

## 2015-01-11 ENCOUNTER — Emergency Department (HOSPITAL_COMMUNITY): Payer: No Typology Code available for payment source

## 2015-01-11 ENCOUNTER — Emergency Department (HOSPITAL_COMMUNITY)
Admission: EM | Admit: 2015-01-11 | Discharge: 2015-01-11 | Disposition: A | Discharge status: Home / Self Care | Payer: No Typology Code available for payment source | Attending: Emergency Medicine | Admitting: Emergency Medicine

## 2015-01-11 ENCOUNTER — Encounter (HOSPITAL_COMMUNITY): Payer: Self-pay | Admitting: Emergency Medicine

## 2015-01-11 DIAGNOSIS — J45909 Unspecified asthma, uncomplicated: Secondary | ICD-10-CM | POA: Insufficient documentation

## 2015-01-11 DIAGNOSIS — Z3202 Encounter for pregnancy test, result negative: Secondary | ICD-10-CM | POA: Insufficient documentation

## 2015-01-11 DIAGNOSIS — E669 Obesity, unspecified: Secondary | ICD-10-CM | POA: Diagnosis not present

## 2015-01-11 DIAGNOSIS — Y999 Unspecified external cause status: Secondary | ICD-10-CM | POA: Insufficient documentation

## 2015-01-11 DIAGNOSIS — G43909 Migraine, unspecified, not intractable, without status migrainosus: Secondary | ICD-10-CM | POA: Diagnosis not present

## 2015-01-11 DIAGNOSIS — Y92410 Unspecified street and highway as the place of occurrence of the external cause: Secondary | ICD-10-CM | POA: Diagnosis not present

## 2015-01-11 DIAGNOSIS — S3991XA Unspecified injury of abdomen, initial encounter: Secondary | ICD-10-CM | POA: Insufficient documentation

## 2015-01-11 DIAGNOSIS — Z8659 Personal history of other mental and behavioral disorders: Secondary | ICD-10-CM | POA: Diagnosis not present

## 2015-01-11 DIAGNOSIS — S5001XA Contusion of right elbow, initial encounter: Secondary | ICD-10-CM | POA: Insufficient documentation

## 2015-01-11 DIAGNOSIS — Z720 Tobacco use: Secondary | ICD-10-CM | POA: Diagnosis not present

## 2015-01-11 DIAGNOSIS — Z87440 Personal history of urinary (tract) infections: Secondary | ICD-10-CM | POA: Insufficient documentation

## 2015-01-11 DIAGNOSIS — R1012 Left upper quadrant pain: Secondary | ICD-10-CM

## 2015-01-11 DIAGNOSIS — S79912A Unspecified injury of left hip, initial encounter: Secondary | ICD-10-CM | POA: Diagnosis not present

## 2015-01-11 DIAGNOSIS — Z8719 Personal history of other diseases of the digestive system: Secondary | ICD-10-CM | POA: Diagnosis not present

## 2015-01-11 DIAGNOSIS — S59901A Unspecified injury of right elbow, initial encounter: Secondary | ICD-10-CM | POA: Diagnosis present

## 2015-01-11 DIAGNOSIS — Z8742 Personal history of other diseases of the female genital tract: Secondary | ICD-10-CM | POA: Insufficient documentation

## 2015-01-11 DIAGNOSIS — Z7951 Long term (current) use of inhaled steroids: Secondary | ICD-10-CM | POA: Diagnosis not present

## 2015-01-11 DIAGNOSIS — Y939 Activity, unspecified: Secondary | ICD-10-CM | POA: Insufficient documentation

## 2015-01-11 DIAGNOSIS — T148XXA Other injury of unspecified body region, initial encounter: Secondary | ICD-10-CM

## 2015-01-11 DIAGNOSIS — Z8619 Personal history of other infectious and parasitic diseases: Secondary | ICD-10-CM | POA: Diagnosis not present

## 2015-01-11 DIAGNOSIS — S3992XA Unspecified injury of lower back, initial encounter: Secondary | ICD-10-CM | POA: Diagnosis not present

## 2015-01-11 DIAGNOSIS — Z79899 Other long term (current) drug therapy: Secondary | ICD-10-CM | POA: Insufficient documentation

## 2015-01-11 LAB — CBC WITH DIFFERENTIAL/PLATELET
BASOS ABS: 0 10*3/uL (ref 0.0–0.1)
BASOS PCT: 0 % (ref 0–1)
EOS ABS: 0.1 10*3/uL (ref 0.0–0.7)
Eosinophils Relative: 1 % (ref 0–5)
HEMATOCRIT: 39.5 % (ref 36.0–46.0)
Hemoglobin: 12.9 g/dL (ref 12.0–15.0)
LYMPHS PCT: 28 % (ref 12–46)
Lymphs Abs: 3.1 10*3/uL (ref 0.7–4.0)
MCH: 28.7 pg (ref 26.0–34.0)
MCHC: 32.7 g/dL (ref 30.0–36.0)
MCV: 87.8 fL (ref 78.0–100.0)
Monocytes Absolute: 0.7 10*3/uL (ref 0.1–1.0)
Monocytes Relative: 6 % (ref 3–12)
NEUTROS ABS: 7.1 10*3/uL (ref 1.7–7.7)
NEUTROS PCT: 65 % (ref 43–77)
Platelets: 251 10*3/uL (ref 150–400)
RBC: 4.5 MIL/uL (ref 3.87–5.11)
RDW: 14.8 % (ref 11.5–15.5)
WBC: 11 10*3/uL — ABNORMAL HIGH (ref 4.0–10.5)

## 2015-01-11 LAB — URINALYSIS, ROUTINE W REFLEX MICROSCOPIC
BILIRUBIN URINE: NEGATIVE
GLUCOSE, UA: NEGATIVE mg/dL
Hgb urine dipstick: NEGATIVE
Ketones, ur: NEGATIVE mg/dL
LEUKOCYTES UA: NEGATIVE
Nitrite: NEGATIVE
Protein, ur: NEGATIVE mg/dL
Specific Gravity, Urine: 1.025 (ref 1.005–1.030)
UROBILINOGEN UA: 1 mg/dL (ref 0.0–1.0)
pH: 7 (ref 5.0–8.0)

## 2015-01-11 LAB — BASIC METABOLIC PANEL
Anion gap: 6 (ref 5–15)
BUN: 8 mg/dL (ref 6–23)
CALCIUM: 8.9 mg/dL (ref 8.4–10.5)
CO2: 27 mmol/L (ref 19–32)
CREATININE: 0.72 mg/dL (ref 0.50–1.10)
Chloride: 106 mmol/L (ref 96–112)
GFR calc Af Amer: >90 mL/min (ref >90)
Glucose, Bld: 97 mg/dL (ref 70–99)
POTASSIUM: 4 mmol/L (ref 3.5–5.1)
SODIUM: 139 mmol/L (ref 135–145)

## 2015-01-11 LAB — CBG MONITORING, ED: GLUCOSE-CAPILLARY: 87 mg/dL (ref 70–99)

## 2015-01-11 LAB — POC URINE PREG, ED: PREG TEST UR: NEGATIVE

## 2015-01-11 MED ORDER — SODIUM CHLORIDE 0.9 % IV BOLUS (SEPSIS)
1000.0000 mL | Freq: Once | INTRAVENOUS | Status: AC
  Administered 2015-01-11: 1000 mL via INTRAVENOUS

## 2015-01-11 MED ORDER — FENTANYL CITRATE 0.05 MG/ML IJ SOLN
50.0000 ug | Freq: Once | INTRAMUSCULAR | Status: AC
  Administered 2015-01-11: 50 ug via INTRAVENOUS
  Filled 2015-01-11: qty 2

## 2015-01-11 MED ORDER — HYDROCODONE-ACETAMINOPHEN 5-325 MG PO TABS
1.0000 | ORAL_TABLET | Freq: Once | ORAL | Status: AC
  Administered 2015-01-11: 1 via ORAL
  Filled 2015-01-11: qty 1

## 2015-01-11 MED ORDER — IBUPROFEN 400 MG PO TABS
600.0000 mg | ORAL_TABLET | Freq: Once | ORAL | Status: AC
  Administered 2015-01-11: 600 mg via ORAL
  Filled 2015-01-11 (×2): qty 1

## 2015-01-11 MED ORDER — IBUPROFEN 600 MG PO TABS: 600.0000 mg | ORAL_TABLET | Freq: Four times a day (QID) | ORAL | Status: DC | PRN

## 2015-01-11 MED ORDER — HYDROCODONE-ACETAMINOPHEN 5-325 MG PO TABS: 1.0000 | ORAL_TABLET | Freq: Four times a day (QID) | ORAL | Status: DC | PRN

## 2015-01-11 MED ORDER — ALBUTEROL SULFATE HFA 108 (90 BASE) MCG/ACT IN AERS
1.0000 | INHALATION_SPRAY | Freq: Once | RESPIRATORY_TRACT | Status: AC
  Administered 2015-01-11: 2 via RESPIRATORY_TRACT
  Filled 2015-01-11: qty 6.7

## 2015-01-11 MED ORDER — METHOCARBAMOL 500 MG PO TABS: 500.0000 mg | ORAL_TABLET | Freq: Two times a day (BID) | ORAL | Status: DC

## 2015-01-11 MED ORDER — IOHEXOL 300 MG/ML  SOLN
100.0000 mL | Freq: Once | INTRAMUSCULAR | Status: AC | PRN
  Administered 2015-01-11: 100 mL via INTRAVENOUS

## 2015-01-11 MED ORDER — ALBUTEROL SULFATE HFA 108 (90 BASE) MCG/ACT IN AERS: 2.0000 | INHALATION_SPRAY | Freq: Once | RESPIRATORY_TRACT | Status: DC
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
## 2015-01-11 NOTE — ED Notes (Signed)
Pt in CT.

## 2015-01-11 NOTE — ED Notes (Signed)
Pt CBG result 87. Informed RN.
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
## 2015-01-11 NOTE — ED Notes (Signed)
Pt. Left with all belongings and refused wheelchair 
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
## 2015-01-12 ENCOUNTER — Emergency Department (HOSPITAL_COMMUNITY): Payer: No Typology Code available for payment source

## 2015-01-12 ENCOUNTER — Emergency Department (HOSPITAL_COMMUNITY)
Admission: EM | Admit: 2015-01-12 | Discharge: 2015-01-13 | Disposition: A | Discharge status: Home / Self Care | Payer: No Typology Code available for payment source | Attending: Emergency Medicine | Admitting: Emergency Medicine

## 2015-01-12 ENCOUNTER — Encounter (HOSPITAL_COMMUNITY): Payer: Self-pay | Admitting: *Deleted

## 2015-01-12 DIAGNOSIS — R52 Pain, unspecified: Secondary | ICD-10-CM

## 2015-01-12 DIAGNOSIS — S79911A Unspecified injury of right hip, initial encounter: Secondary | ICD-10-CM | POA: Diagnosis present

## 2015-01-12 DIAGNOSIS — G43909 Migraine, unspecified, not intractable, without status migrainosus: Secondary | ICD-10-CM | POA: Diagnosis not present

## 2015-01-12 DIAGNOSIS — Z720 Tobacco use: Secondary | ICD-10-CM | POA: Insufficient documentation

## 2015-01-12 DIAGNOSIS — Z79899 Other long term (current) drug therapy: Secondary | ICD-10-CM | POA: Insufficient documentation

## 2015-01-12 DIAGNOSIS — Y999 Unspecified external cause status: Secondary | ICD-10-CM | POA: Insufficient documentation

## 2015-01-12 DIAGNOSIS — E663 Overweight: Secondary | ICD-10-CM | POA: Diagnosis not present

## 2015-01-12 DIAGNOSIS — J45909 Unspecified asthma, uncomplicated: Secondary | ICD-10-CM | POA: Insufficient documentation

## 2015-01-12 DIAGNOSIS — Z87440 Personal history of urinary (tract) infections: Secondary | ICD-10-CM | POA: Diagnosis not present

## 2015-01-12 DIAGNOSIS — Y92410 Unspecified street and highway as the place of occurrence of the external cause: Secondary | ICD-10-CM | POA: Insufficient documentation

## 2015-01-12 DIAGNOSIS — F419 Anxiety disorder, unspecified: Secondary | ICD-10-CM | POA: Insufficient documentation

## 2015-01-12 DIAGNOSIS — Z8719 Personal history of other diseases of the digestive system: Secondary | ICD-10-CM | POA: Diagnosis not present

## 2015-01-12 DIAGNOSIS — Y939 Activity, unspecified: Secondary | ICD-10-CM | POA: Insufficient documentation

## 2015-01-12 DIAGNOSIS — Z8619 Personal history of other infectious and parasitic diseases: Secondary | ICD-10-CM | POA: Diagnosis not present

## 2015-01-12 DIAGNOSIS — S299XXA Unspecified injury of thorax, initial encounter: Secondary | ICD-10-CM | POA: Diagnosis not present

## 2015-01-12 DIAGNOSIS — S0990XA Unspecified injury of head, initial encounter: Secondary | ICD-10-CM | POA: Insufficient documentation

## 2015-01-12 DIAGNOSIS — Z8742 Personal history of other diseases of the female genital tract: Secondary | ICD-10-CM | POA: Diagnosis not present

## 2015-01-12 DIAGNOSIS — H538 Other visual disturbances: Secondary | ICD-10-CM | POA: Insufficient documentation

## 2015-01-12 MED ORDER — OXYCODONE-ACETAMINOPHEN 5-325 MG PO TABS
1.0000 | ORAL_TABLET | Freq: Once | ORAL | Status: AC
  Administered 2015-01-13: 1 via ORAL
  Filled 2015-01-12: qty 1
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
## 2015-01-13 ENCOUNTER — Emergency Department (HOSPITAL_COMMUNITY): Payer: No Typology Code available for payment source

## 2015-01-13 ENCOUNTER — Encounter (HOSPITAL_COMMUNITY): Payer: Self-pay | Admitting: Radiology

## 2015-01-13 DIAGNOSIS — S79911A Unspecified injury of right hip, initial encounter: Secondary | ICD-10-CM | POA: Diagnosis not present

## 2015-01-13 NOTE — Discharge Instructions (Signed)

## 2015-01-13 NOTE — ED Notes (Signed)
Pt reconnected to the pulse ox and bp cuff.

## 2015-02-06 ENCOUNTER — Emergency Department (HOSPITAL_COMMUNITY)
Admission: EM | Admit: 2015-02-06 | Discharge: 2015-02-06 | Disposition: A | Discharge status: Home / Self Care | Payer: Medicaid Other | Attending: Emergency Medicine | Admitting: Emergency Medicine

## 2015-02-06 ENCOUNTER — Encounter (HOSPITAL_COMMUNITY): Payer: Self-pay | Admitting: Emergency Medicine

## 2015-02-06 DIAGNOSIS — G43909 Migraine, unspecified, not intractable, without status migrainosus: Secondary | ICD-10-CM | POA: Diagnosis present

## 2015-02-06 DIAGNOSIS — Z8659 Personal history of other mental and behavioral disorders: Secondary | ICD-10-CM | POA: Insufficient documentation

## 2015-02-06 DIAGNOSIS — Z8619 Personal history of other infectious and parasitic diseases: Secondary | ICD-10-CM | POA: Diagnosis not present

## 2015-02-06 DIAGNOSIS — J029 Acute pharyngitis, unspecified: Secondary | ICD-10-CM

## 2015-02-06 DIAGNOSIS — Z720 Tobacco use: Secondary | ICD-10-CM | POA: Diagnosis not present

## 2015-02-06 DIAGNOSIS — Z87440 Personal history of urinary (tract) infections: Secondary | ICD-10-CM | POA: Diagnosis not present

## 2015-02-06 DIAGNOSIS — H6121 Impacted cerumen, right ear: Secondary | ICD-10-CM | POA: Insufficient documentation

## 2015-02-06 DIAGNOSIS — Z79899 Other long term (current) drug therapy: Secondary | ICD-10-CM | POA: Insufficient documentation

## 2015-02-06 DIAGNOSIS — J45909 Unspecified asthma, uncomplicated: Secondary | ICD-10-CM | POA: Diagnosis not present

## 2015-02-06 DIAGNOSIS — Z793 Long term (current) use of hormonal contraceptives: Secondary | ICD-10-CM | POA: Diagnosis not present

## 2015-02-06 DIAGNOSIS — Z8719 Personal history of other diseases of the digestive system: Secondary | ICD-10-CM | POA: Insufficient documentation

## 2015-02-06 DIAGNOSIS — E669 Obesity, unspecified: Secondary | ICD-10-CM | POA: Diagnosis present

## 2015-02-06 DIAGNOSIS — Z8679 Personal history of other diseases of the circulatory system: Secondary | ICD-10-CM | POA: Insufficient documentation

## 2015-02-06 DIAGNOSIS — F339 Major depressive disorder, recurrent, unspecified: Secondary | ICD-10-CM | POA: Diagnosis present

## 2015-02-06 LAB — RAPID STREP SCREEN (MED CTR MEBANE ONLY): STREPTOCOCCUS, GROUP A SCREEN (DIRECT): NEGATIVE

## 2015-02-06 MED ORDER — PENICILLIN G BENZATHINE 1200000 UNIT/2ML IM SUSP
1.2000 10*6.[IU] | Freq: Once | INTRAMUSCULAR | Status: AC
  Administered 2015-02-06: 1.2 10*6.[IU] via INTRAMUSCULAR
  Filled 2015-02-06: qty 2
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
## 2015-02-06 NOTE — Discharge Instructions (Signed)
Strep Throat Ms. Ane PaymentHooker, see a primary care physician within 3 days for close follow-up. Your were given antibiotics in emergency department to treat strep throat. If symptoms worsen come back to emergency department immediately. Thank you. Strep throat is an infection of the throat caused by a bacteria named Streptococcus pyogenes. Your health care provider may call the infection streptococcal "tonsillitis" or "pharyngitis" depending on whether there are signs of inflammation in the tonsils or back of the throat. Strep throat is most common in children aged 5-15 years during the cold months of the year, but it can occur in people of any age during any season. This infection is spread from person to person (contagious) through coughing, sneezing, or other close contact. SIGNS AND SYMPTOMS   Fever or chills.  Painful, swollen, red tonsils or throat.  Pain or difficulty when swallowing.  White or yellow spots on the tonsils or throat.  Swollen, tender lymph nodes or "glands" of the neck or under the jaw.  Red rash all over the body (rare). DIAGNOSIS  Many different infections can cause the same symptoms. A test must be done to confirm the diagnosis so the right treatment can be given. A "rapid strep test" can help your health care provider make the diagnosis in a few minutes. If this test is not available, a light swab of the infected area can be used for a throat culture test. If a throat culture test is done, results are usually available in a day or two. TREATMENT  Strep throat is treated with antibiotic medicine. HOME CARE INSTRUCTIONS   Gargle with 1 tsp of salt in 1 cup of warm water, 3-4 times per day or as needed for comfort.  Family members who also have a sore throat or fever should be tested for strep throat and treated with antibiotics if they have the strep infection.  Make sure everyone in your household washes their hands well.  Do not share food, drinking cups, or personal  items that could cause the infection to spread to others.  You may need to eat a soft food diet until your sore throat gets better.  Drink enough water and fluids to keep your urine clear or pale yellow. This will help prevent dehydration.  Get plenty of rest.  Stay home from school, day care, or work until you have been on antibiotics for 24 hours.  Take medicines only as directed by your health care provider.  Take your antibiotic medicine as directed by your health care provider. Finish it even if you start to feel better. SEEK MEDICAL CARE IF:   The glands in your neck continue to enlarge.  You develop a rash, cough, or earache.  You cough up green, yellow-brown, or bloody sputum.  You have pain or discomfort not controlled by medicines.  Your problems seem to be getting worse rather than better.  You have a fever. SEEK IMMEDIATE MEDICAL CARE IF:   You develop any new symptoms such as vomiting, severe headache, stiff or painful neck, chest pain, shortness of breath, or trouble swallowing.  You develop severe throat pain, drooling, or changes in your voice.  You develop swelling of the neck, or the skin on the neck becomes red and tender.  You develop signs of dehydration, such as fatigue, dry mouth, and decreased urination.  You become increasingly sleepy, or you cannot wake up completely. MAKE SURE YOU:  Understand these instructions.  Will watch your condition.  Will get help right away if  you are not doing well or get worse. Document Released: 09/28/2000 Document Revised: 02/15/2014 Document Reviewed: 11/30/2010 [facility] Patient Information 2015 [place], [place]. This information is not intended to replace advice given to you by your health care provider. Make sure you discuss any questions you have with your health care provider.

## 2015-02-06 NOTE — ED Notes (Signed)
No S/S of reaction noted from antibiotic.  Discharged home.

## 2015-02-06 NOTE — ED Provider Notes (Signed)
CSN: [id number]     Arrival date & time 02/06/15  0110 History  This chart was scribed for Tomasita CrumbleAdeleke Hue Steveson, MD by Evon Slackerrance Branch, ED Scribe. This patient was seen in room B15C/B15C and the patient's care was started at 3:13 AM.      Chief Complaint  Patient presents with  . Sore Throat  . Otalgia   Patient is a 24 y.o. female presenting with pharyngitis and ear pain. The history is provided by the patient. No language interpreter was used.  Sore Throat This is a new problem. The current episode started 2 days ago. The problem occurs rarely. The problem has not changed since onset.Nothing aggravates the symptoms. Nothing relieves the symptoms. She has tried nothing for the symptoms.  Otalgia Associated symptoms: cough   Associated symptoms: no congestion and no fever    HPI Comments: Margaret Cline is a 24 y.o. female who presents to the Emergency Department complaining of sore throat onset 2 days prior. Pt states that she feels as if her tonsils are swollen. She states that she noticed that they where bleeding and covered in mucous. Pt states she also took a q-tip to clean her tonsils.  Pt states she has associated left ear pain.  Pt reports cough that began today. Pt states she has recently been around her daughter who recently had URI symptoms. Pt doesn't report any medications PTA. Pt doesn't report any other related symptoms.     Past Medical History  Diagnosis Date  . Asthma   . Chlamydia 2008  . Gonorrhea   . Irregular periods/menstrual cycles   . Constipation, chronic 11/13/11  . Migraines     Migraines  . Depression     postpartum  . Trichomonas 2008  . Hx: UTI (urinary tract infection)     Frequently  . Obese    Past Surgical History  Procedure Laterality Date  . Wisdom tooth extraction    . Induced abortion     Family History  Problem Relation Age of Onset  . Anesthesia problems Neg Hx   . Hypotension Neg Hx   . Malignant hyperthermia Neg Hx   . Pseudochol deficiency  Neg Hx   . Hypertension Mother   . Heart disease Mother   . Diabetes Mother   . Stroke Mother   . Hyperlipidemia Mother   . Hypertension Sister   . Thrombophlebitis Sister     clotting issue  . Depression Sister   . Heart disease Maternal Grandfather   . Drug abuse Father   . Alcohol abuse Maternal Uncle   . Kidney disease Maternal Uncle     failure  . Rheum arthritis Sister     arthritis vs fibromyalgia  . Heart murmur Sister   . Fibromyalgia Sister   . Depression Sister   . Hypertension Sister    History  Substance Use Topics  . Smoking status: Current Every Day Smoker    Types: Cigarettes  . Smokeless tobacco: Never Used  . Alcohol Use: Yes     Comment: occasional   OB History    Gravida Para Term Preterm AB TAB SAB Ectopic Multiple Living   3 2 2  0 1 1 0 0 0 2      Review of Systems  Constitutional: Negative for fever and chills.  HENT: Positive for ear pain. Negative for congestion.   Respiratory: Positive for cough.   All other systems reviewed and are negative.    Allergies  Prednisone  Home Medications  Prior to Admission medications   Medication Sig Start Date End Date Taking? Authorizing Provider  albuterol (PROAIR HFA) 108 (90 BASE) MCG/ACT inhaler Inhale 2 puffs into the lungs every 4 (four) hours as needed for wheezing or shortness of breath. For shortness of breath/wheezing    Historical Provider, MD  albuterol (PROVENTIL) (2.5 MG/3ML) 0.083% nebulizer solution Take 3 mLs (2.5 mg total) by nebulization every 6 (six) hours as needed for wheezing or shortness of breath. Patient not taking: Reported on 01/11/2015 01/12/14   Rolland Porter, MD  cetirizine (ZYRTEC) 10 MG tablet Take 10 mg by mouth daily.    Historical Provider, MD  ciprofloxacin (CIPRO) 250 MG tablet Take 1 tablet (250 mg total) by mouth every 12 (twelve) hours. Patient not taking: Reported on 01/11/2015 09/18/14   Marny Lowenstein, PA-C  cyclobenzaprine (FLEXERIL) 10 MG tablet Take 1 tablet  (10 mg total) by mouth 2 (two) times daily as needed for muscle spasms. Patient not taking: Reported on 01/11/2015 04/15/14   Jaynie Crumble, PA-C  etonogestrel (NEXPLANON) 68 MG IMPL implant Inject 1 each into the skin once.    Historical Provider, MD  fluticasone (FLONASE) 50 MCG/ACT nasal spray Place 1 spray into both nostrils 2 (two) times daily as needed for allergies or rhinitis.    Historical Provider, MD  HYDROcodone-acetaminophen (NORCO/VICODIN) 5-325 MG per tablet Take 1 tablet by mouth every 6 (six) hours as needed. 01/11/15   Derwood Kaplan, MD  ibuprofen (ADVIL,MOTRIN) 600 MG tablet Take 1 tablet (600 mg total) by mouth every 6 (six) hours as needed. 01/11/15   Derwood Kaplan, MD  methocarbamol (ROBAXIN) 500 MG tablet Take 1 tablet (500 mg total) by mouth 2 (two) times daily. 01/11/15   Derwood Kaplan, MD  metoCLOPramide (REGLAN) 10 MG tablet Take 1 tablet (10 mg total) by mouth every 6 (six) hours as needed (nausea/headache). Patient not taking: Reported on 01/11/2015 09/05/11 09/15/11  Wayland Salinas, MD   BP 118/72 mmHg  Pulse 97  Temp(Src) 98.7 F (37.1 C) (Oral)  Resp 16  Ht  (1.702 m)  Wt 241 lb (109.317 kg)  BMI 37.74 kg/m2  SpO2 99%   Physical Exam  Constitutional: She is oriented to person, place, and time. She appears well-developed and well-nourished. No distress.  HENT:  Head: Normocephalic and atraumatic.  Left Ear: Tympanic membrane, external ear and ear canal normal.  Nose: Nose normal.  Mouth/Throat: Oropharyngeal exudate and posterior oropharyngeal erythema present.  Enlarged left tonsil with erythema and exudate noted. Right TM impacted with cerumen  Eyes: Conjunctivae and EOM are normal. Pupils are equal, round, and reactive to light. No scleral icterus.  Neck: Normal range of motion. Neck supple. No JVD present. No tracheal deviation present. No thyromegaly present.  Cardiovascular: Normal rate, regular rhythm and normal heart sounds.  Exam reveals no  gallop and no friction rub.   No murmur heard. Pulmonary/Chest: Effort normal and breath sounds normal. No respiratory distress. She has no wheezes. She exhibits no tenderness.  Abdominal: Soft. Bowel sounds are normal. She exhibits no distension and no mass. There is no tenderness. There is no rebound and no guarding.  Musculoskeletal: Normal range of motion. She exhibits no edema or tenderness.  Lymphadenopathy:    She has no cervical adenopathy.  Neurological: She is alert and oriented to person, place, and time. No cranial nerve deficit. She exhibits normal muscle tone.  Skin: Skin is warm and dry. No rash noted. No erythema. No pallor.  Nursing note  and vitals reviewed.   ED Course  Procedures (including critical care time) DIAGNOSTIC STUDIES: Oxygen Saturation is 99% on RA, normal by my interpretation.    COORDINATION OF CARE: 3:21 AM-Discussed treatment plan with pt at bedside and pt agreed to plan.     Labs Review Labs Reviewed  RAPID STREP SCREEN  CULTURE, GROUP A STREP    Imaging Review No results found.   EKG Interpretation None      MDM   Final diagnoses:  None   Patient presents to the ED for sore throat.  Ear exam is unremarkable, tonsils are asymmetrical.  Will cover bacterial pharyngitis with penicillin injection.  Primary follow-up was advised within 3 days. She is encouraged to continue Excedrin and Claritin as needed for symptoms. Her vital signs remain within her normal limits and she is safe for discharge.  I personally performed the services described in this documentation, which was scribed in my presence. The recorded information has been reviewed and is accurate.     Tomasita Crumble, MD 02/06/15 [phone]

## 2015-02-08 LAB — CULTURE, GROUP A STREP

## 2015-02-23 ENCOUNTER — Ambulatory Visit (INDEPENDENT_AMBULATORY_CARE_PROVIDER_SITE_OTHER): Payer: Medicaid Other | Admitting: Neurology

## 2015-02-23 ENCOUNTER — Encounter: Payer: Self-pay | Admitting: Neurology

## 2015-02-23 VITALS — BP 113/76 | HR 85 | Resp 16 | Ht 67.0 in | Wt 237.0 lb

## 2015-02-23 DIAGNOSIS — G43701 Chronic migraine without aura, not intractable, with status migrainosus: Secondary | ICD-10-CM | POA: Diagnosis not present

## 2015-02-23 DIAGNOSIS — Z6833 Body mass index (BMI) 33.0-33.9, adult: Secondary | ICD-10-CM | POA: Insufficient documentation

## 2015-02-23 DIAGNOSIS — R51 Headache: Secondary | ICD-10-CM | POA: Diagnosis not present

## 2015-02-23 DIAGNOSIS — F17210 Nicotine dependence, cigarettes, uncomplicated: Secondary | ICD-10-CM

## 2015-02-23 DIAGNOSIS — R519 Headache, unspecified: Secondary | ICD-10-CM | POA: Insufficient documentation

## 2015-02-23 MED ORDER — TOPIRAMATE 25 MG PO TABS: 25.0000 mg | ORAL_TABLET | Freq: Two times a day (BID) | ORAL | Status: DC

## 2015-02-23 MED ORDER — SUMATRIPTAN SUCCINATE 50 MG PO TABS: 50.0000 mg | ORAL_TABLET | ORAL | Status: DC | PRN

## 2015-02-23 NOTE — Patient Instructions (Addendum)
Smoking Cessation, Tips for Success If you are ready to quit smoking, congratulations! You have chosen to help yourself be healthier. Cigarettes bring nicotine, tar, carbon monoxide, and other irritants into your body. Your lungs, heart, and blood vessels will be able to work better without these poisons. There are many different ways to quit smoking. Nicotine gum, nicotine patches, a nicotine inhaler, or nicotine nasal spray can help with physical craving. Hypnosis, support groups, and medicines help break the habit of smoking. WHAT THINGS CAN I DO TO MAKE QUITTING EASIER?  Here are some tips to help you quit for good:  Pick a date when you will quit smoking completely. Tell all of your friends and family about your plan to quit on that date.  Do not try to slowly cut down on the number of cigarettes you are smoking. Pick a quit date and quit smoking completely starting on that day.  Throw away all cigarettes.   Clean and remove all ashtrays from your home, work, and car.  On a card, write down your reasons for quitting. Carry the card with you and read it when you get the urge to smoke.  Cleanse your body of nicotine. Drink enough water and fluids to keep your urine clear or pale yellow. Do this after quitting to flush the nicotine from your body.  Learn to predict your moods. Do not let a bad situation be your excuse to have a cigarette. Some situations in your life might tempt you into wanting a cigarette.  Never have "just one" cigarette. It leads to wanting another and another. Remind yourself of your decision to quit.  Change habits associated with smoking. If you smoked while driving or when feeling stressed, try other activities to replace smoking. Stand up when drinking your coffee. Brush your teeth after eating. Sit in a different chair when you read the paper. Avoid alcohol while trying to quit, and try to drink fewer caffeinated beverages. Alcohol and caffeine may urge you to  smoke.  Avoid foods and drinks that can trigger a desire to smoke, such as sugary or spicy foods and alcohol.  Ask people who smoke not to smoke around you.  Have something planned to do right after eating or having a cup of coffee. For example, plan to take a walk or exercise.  Try a relaxation exercise to calm you down and decrease your stress. Remember, you may be tense and nervous for the first 2 weeks after you quit, but this will pass.  Find new activities to keep your hands busy. Play with a pen, coin, or rubber band. Doodle or draw things on paper.  Brush your teeth right after eating. This will help cut down on the craving for the taste of tobacco after meals. You can also try mouthwash.   Use oral substitutes in place of cigarettes. Try using lemon drops, carrots, cinnamon sticks, or chewing gum. Keep them handy so they are available when you have the urge to smoke.  When you have the urge to smoke, try deep breathing.  Designate your home as a nonsmoking area.  If you are a heavy smoker, ask your health care provider about a prescription for nicotine chewing gum. It can ease your withdrawal from nicotine.  Reward yourself. Set aside the cigarette money you save and buy yourself something nice.  Look for support from others. Join a support group or smoking cessation program. Ask someone at home or at work to help you with your plan   to quit smoking.  Always ask yourself, "Do I need this cigarette or is this just a reflex?" Tell yourself, "Today, I choose not to smoke," or "I do not want to smoke." You are reminding yourself of your decision to quit.  Do not replace cigarette smoking with electronic cigarettes (commonly called e-cigarettes). The safety of e-cigarettes is unknown, and some may contain harmful chemicals.  If you relapse, do not give up! Plan ahead and think about what you will do the next time you get the urge to smoke. HOW WILL I FEEL WHEN I QUIT SMOKING? You  may have symptoms of withdrawal because your body is used to nicotine (the addictive substance in cigarettes). You may crave cigarettes, be irritable, feel very hungry, cough often, get headaches, or have difficulty concentrating. The withdrawal symptoms are only temporary. They are strongest when you first quit but will go away within 10-14 days. When withdrawal symptoms occur, stay in control. Think about your reasons for quitting. Remind yourself that these are signs that your body is healing and getting used to being without cigarettes. Remember that withdrawal symptoms are easier to treat than the major diseases that smoking can cause.  Even after the withdrawal is over, expect periodic urges to smoke. However, these cravings are generally short lived and will go away whether you smoke or not. Do not smoke! WHAT RESOURCES ARE AVAILABLE TO HELP ME QUIT SMOKING? Your health care provider can direct you to community resources or hospitals for support, which may include:  Group support.  Education.  Hypnosis.  Therapy. Document Released: 06/29/2004 Document Revised: 02/15/2014 Document Reviewed: 03/19/2013 [facility] Patient Information 2015 [employer] [place]. This information is not intended to replace advice given to you by your health care provider. Make sure you discuss any questions you have with your health care provider. Topiramate tablets What is this medicine? TOPIRAMATE (toe PYRE a mate) is used to treat seizures in adults or children with epilepsy. It is also used for the prevention of migraine headaches. This medicine may be used for other purposes; ask your health care provider or pharmacist if you have questions. COMMON BRAND NAME(S): Topamax, Topiragen What should I tell my health care provider before I take this medicine? They need to know if you have any of these conditions: -bleeding disorders -cirrhosis of the liver or liver disease -diarrhea -glaucoma -kidney stones or  kidney disease -low blood counts, like low white cell, platelet, or red cell counts -lung disease like asthma, obstructive pulmonary disease, emphysema -metabolic acidosis -on a ketogenic diet -schedule for surgery or a procedure -suicidal thoughts, plans, or attempt; a previous suicide attempt by you or a family member -an unusual or allergic reaction to topiramate, other medicines, foods, dyes, or preservatives -pregnant or trying to get pregnant -breast-feeding How should I use this medicine? Take this medicine by mouth with a glass of water. Follow the directions on the prescription label. Do not crush or chew. You may take this medicine with meals. Take your medicine at regular intervals. Do not take it more often than directed. Talk to your pediatrician regarding the use of this medicine in children. Special care may be needed. While this drug may be prescribed for children as young as 402 years of age for selected conditions, precautions do apply. Overdosage: If you think you have taken too much of this medicine contact a poison control center or emergency room at once. NOTE: This medicine is only for you. Do not share this medicine with others.  What if I miss a dose? If you miss a dose, take it as soon as you can. If your next dose is to be taken in less than 6 hours, then do not take the missed dose. Take the next dose at your regular time. Do not take double or extra doses. What may interact with this medicine? Do not take this medicine with any of the following medications: -probenecid This medicine may also interact with the following medications: -acetazolamide -alcohol -amitriptyline -aspirin and aspirin-like medicines -birth control pills -certain medicines for depression -certain medicines for seizures -certain medicines that treat or prevent blood clots like warfarin, enoxaparin, dalteparin, apixaban, dabigatran, and  rivaroxaban -digoxin -hydrochlorothiazide -lithium -medicines for pain, sleep, or muscle relaxation -metformin -methazolamide -NSAIDS, medicines for pain and inflammation, like ibuprofen or naproxen -pioglitazone -risperidone This list may not describe all possible interactions. Give your health care provider a list of all the medicines, herbs, non-prescription drugs, or dietary supplements you use. Also tell them if you smoke, drink alcohol, or use illegal drugs. Some items may interact with your medicine. What should I watch for while using this medicine? Visit your doctor or health care professional for regular checks on your progress. Do not stop taking this medicine suddenly. This increases the risk of seizures if you are using this medicine to control epilepsy. Wear a medical identification bracelet or chain to say you have epilepsy or seizures, and carry a card that lists all your medicines. This medicine can decrease sweating and increase your body temperature. Watch for signs of deceased sweating or fever, especially in children. Avoid extreme heat, hot baths, and saunas. Be careful about exercising, especially in hot weather. Contact your health care provider right away if you notice a fever or decrease in sweating. You should drink plenty of fluids while taking this medicine. If you have had kidney stones in the past, this will help to reduce your chances of forming kidney stones. If you have stomach pain, with nausea or vomiting and yellowing of your eyes or skin, call your doctor immediately. You may get drowsy, dizzy, or have blurred vision. Do not drive, use machinery, or do anything that needs mental alertness until you know how this medicine affects you. To reduce dizziness, do not sit or stand up quickly, especially if you are an older patient. Alcohol can increase drowsiness and dizziness. Avoid alcoholic drinks. If you notice blurred vision, eye pain, or other eye problems, seek  medical attention at once for an eye exam. The use of this medicine may increase the chance of suicidal thoughts or actions. Pay special attention to how you are responding while on this medicine. Any worsening of mood, or thoughts of suicide or dying should be reported to your health care professional right away. This medicine may increase the chance of developing metabolic acidosis. If left untreated, this can cause kidney stones, bone disease, or slowed growth in children. Symptoms include breathing fast, fatigue, loss of appetite, irregular heartbeat, or loss of consciousness. Call your doctor immediately if you experience any of these side effects. Also, tell your doctor about any surgery you plan on having while taking this medicine since this may increase your risk for metabolic acidosis. Birth control pills may not work properly while you are taking this medicine. Talk to your doctor about using an extra method of birth control. Women who become pregnant while using this medicine may enroll in the [place] Antiepileptic Drug Pregnancy Registry by calling [phone]. This registry collects  information about the safety of antiepileptic drug use during pregnancy. What side effects may I notice from receiving this medicine? Side effects that you should report to your doctor or health care professional as soon as possible: -allergic reactions like skin rash, itching or hives, swelling of the face, lips, or tongue -decreased sweating and/or rise in body temperature -depression -difficulty breathing, fast or irregular breathing patterns -difficulty speaking -difficulty walking or controlling muscle movements -hearing impairment -redness, blistering, peeling or loosening of the skin, including inside the mouth -tingling, pain or numbness in the hands or feet -unusual bleeding or bruising -unusually weak or tired -worsening of mood, thoughts or actions of suicide or dying Side effects  that usually do not require medical attention (report to your doctor or health care professional if they continue or are bothersome): -altered taste -back pain, joint or muscle aches and pains -diarrhea, or constipation -headache -loss of appetite -nausea -stomach upset, indigestion -tremors This list may not describe all possible side effects. Call your doctor for medical advice about side effects. You may report side effects to FDA at 1-800-FDA-1088. Where should I keep my medicine? Keep out of the reach of children. Store at room temperature between 15 and 30 degrees C (59 and 86 degrees F) in a tightly closed container. Protect from moisture. Throw away any unused medicine after the expiration date. NOTE: This sheet is a summary. It may not cover all possible information. If you have questions about this medicine, talk to your doctor, pharmacist, or health care provider.  2015, [employer]. (2013-10-05 23:17:57)
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
## 2015-03-17 ENCOUNTER — Encounter (HOSPITAL_COMMUNITY): Payer: Self-pay | Admitting: Emergency Medicine

## 2015-03-17 ENCOUNTER — Emergency Department (HOSPITAL_COMMUNITY)
Admission: EM | Admit: 2015-03-17 | Discharge: 2015-03-17 | Disposition: A | Discharge status: Home / Self Care | Payer: Medicaid Other | Attending: Emergency Medicine | Admitting: Emergency Medicine

## 2015-03-17 DIAGNOSIS — R6883 Chills (without fever): Secondary | ICD-10-CM | POA: Insufficient documentation

## 2015-03-17 DIAGNOSIS — E669 Obesity, unspecified: Secondary | ICD-10-CM | POA: Diagnosis not present

## 2015-03-17 DIAGNOSIS — J45909 Unspecified asthma, uncomplicated: Secondary | ICD-10-CM | POA: Diagnosis not present

## 2015-03-17 DIAGNOSIS — Z8719 Personal history of other diseases of the digestive system: Secondary | ICD-10-CM | POA: Diagnosis not present

## 2015-03-17 DIAGNOSIS — F329 Major depressive disorder, single episode, unspecified: Secondary | ICD-10-CM | POA: Insufficient documentation

## 2015-03-17 DIAGNOSIS — Z8619 Personal history of other infectious and parasitic diseases: Secondary | ICD-10-CM | POA: Insufficient documentation

## 2015-03-17 DIAGNOSIS — G8929 Other chronic pain: Secondary | ICD-10-CM | POA: Diagnosis not present

## 2015-03-17 DIAGNOSIS — Z79899 Other long term (current) drug therapy: Secondary | ICD-10-CM | POA: Diagnosis not present

## 2015-03-17 DIAGNOSIS — Z87442 Personal history of urinary calculi: Secondary | ICD-10-CM | POA: Diagnosis not present

## 2015-03-17 DIAGNOSIS — J0101 Acute recurrent maxillary sinusitis: Secondary | ICD-10-CM | POA: Diagnosis not present

## 2015-03-17 DIAGNOSIS — Z720 Tobacco use: Secondary | ICD-10-CM | POA: Insufficient documentation

## 2015-03-17 DIAGNOSIS — Z87828 Personal history of other (healed) physical injury and trauma: Secondary | ICD-10-CM | POA: Diagnosis not present

## 2015-03-17 DIAGNOSIS — G43909 Migraine, unspecified, not intractable, without status migrainosus: Secondary | ICD-10-CM | POA: Diagnosis not present

## 2015-03-17 DIAGNOSIS — R51 Headache: Secondary | ICD-10-CM | POA: Diagnosis present

## 2015-03-17 DIAGNOSIS — Z7951 Long term (current) use of inhaled steroids: Secondary | ICD-10-CM | POA: Diagnosis not present

## 2015-03-17 MED ORDER — AMOXICILLIN-POT CLAVULANATE 875-125 MG PO TABS: 1.0000 | ORAL_TABLET | Freq: Two times a day (BID) | ORAL | Status: DC

## 2015-03-17 NOTE — ED Notes (Signed)
Pt c/o "sinus infection". States she gets them frequently and wants "the shot". States has been taking decongestants x 1 week.
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
## 2015-04-06 DIAGNOSIS — Z0289 Encounter for other administrative examinations: Secondary | ICD-10-CM

## 2015-07-04 ENCOUNTER — Encounter: Payer: Self-pay | Admitting: Obstetrics & Gynecology

## 2015-07-14 ENCOUNTER — Ambulatory Visit (INDEPENDENT_AMBULATORY_CARE_PROVIDER_SITE_OTHER): Payer: Medicaid Other | Admitting: Obstetrics & Gynecology

## 2015-07-14 ENCOUNTER — Encounter: Payer: Self-pay | Admitting: Obstetrics & Gynecology

## 2015-07-14 VITALS — BP 108/79 | HR 86 | Ht 67.5 in | Wt 223.8 lb

## 2015-07-14 DIAGNOSIS — Z30018 Encounter for initial prescription of other contraceptives: Secondary | ICD-10-CM | POA: Diagnosis not present

## 2015-07-14 DIAGNOSIS — Z30017 Encounter for initial prescription of implantable subdermal contraceptive: Secondary | ICD-10-CM

## 2015-07-14 DIAGNOSIS — Z3046 Encounter for surveillance of implantable subdermal contraceptive: Secondary | ICD-10-CM

## 2015-07-14 MED ORDER — ETONOGESTREL 68 MG ~~LOC~~ IMPL
68.0000 mg | DRUG_IMPLANT | Freq: Once | SUBCUTANEOUS | Status: AC
  Administered 2015-07-14: 68 mg via SUBCUTANEOUS

## 2015-07-14 NOTE — Progress Notes (Signed)
Patient ID: Margaret Cline, female   DOB: Jan 06, 1991, 24 y.o.   MRN: [id number] Patient given informed consent, she signed consent form. She has gained >40# since her Nexplanon was placed.  She wants it removed.  Appropriate time out taken.  Patient's left arm was prepped and draped in the usual sterile fashion. Patient was prepped with alcohol swab and then injected with 3 ml of 1 % lidocaine.  She was prepped with betadine.  A #11 blade was used to make a small incision over the Nexplanon rod.  Using curved forceps, the end of the rod was grasped and the scar tissue was removed and the device was removed intact.  Next the  Nexplanon removed from packaging,  Device confirmed in needle, then inserted full length of needle and withdrawn per handbook instructions.  There was minimal blood loss.  Patient insertion site covered with steristrips and guaze and a pressure bandage to reduce any bruising.  The patient tolerated the procedure well and was given post procedure instructions. Return in about one month for Nexplanon check if needed.   Carolyn L. Harraway-Smith, M.D., [facility]

## 2015-07-14 NOTE — Patient Instructions (Signed)
Contraceptive Implant Information A contraceptive implant is a plastic rod that is inserted under your skin. It is usually inserted under the skin of your upper arm. It continually releases small amounts of progestin (synthetic progesterone) into your bloodstream. This prevents an egg from being released from your ovaries. It also thickens your cervical mucus to prevent sperm from entering the cervix, and it thins your uterine lining to prevent a fertilized egg from attaching to your uterus. Contraceptive implants can be effective for up to 3 years. They do not provide protection against sexually transmitted diseases (STDs).  The procedure to insert an implant usually takes about 10 minutes. There may be minor bruising, swelling, and discomfort at the insertion site for a couple days. The implant begins to work within the first day. Other contraceptive protection may be necessary for 7 days. Be sure to discuss with your health care provider if you need a backup method of contraception.  Your health care provider will make sure you are a good candidate for the contraceptive implant. Discuss with your health care provider the possible side effects of the implant. ADVANTAGES  It prevents pregnancy for up to 3 years.  It is easily reversible.  It is convenient.  It can be used when breastfeeding.  It can be used by women who cannot take estrogen. DISADVANTAGES  You may have irregular or unplanned vaginal bleeding.  You may develop side effects, including headache, weight gain, acne, breast tenderness, or mood changes.  You may have tissue or nerve damage after insertion (rare).  It may be difficult and uncomfortable to remove.  Certain medicines may interfere with the effectiveness of the implant. REMOVAL OF IMPLANT The implant should be removed in 3 years or as directed by your health care provider. The implant's effect wears off in a few hours after removal. Your ability to get pregnant  (fertility) may be restored in 1-2 weeks. A new implant can be inserted as soon as the old one is removed if desired. CONTRAINDICATIONS You should not get the implant if you are experiencing any of the following situations:  You are pregnant.  You have a history of breast cancer, osteoporosis, blood clots, heart disease, diabetes, high blood pressure, liver disease, tumors, or stroke.   You have undiagnosed vaginal bleeding.  You have a sensitivity to any part of the implant. Document Released: 09/20/2011 Document Revised: 06/03/2013 Document Reviewed: 03/30/2013 [employer] Patient Information 2015 [employer]. This information is not intended to replace advice given to you by your health care provider. Make sure you discuss any questions you have with your health care provider.  

## 2015-08-12 ENCOUNTER — Other Ambulatory Visit: Payer: Self-pay

## 2015-09-06 ENCOUNTER — Encounter (HOSPITAL_COMMUNITY): Payer: Self-pay

## 2015-09-06 ENCOUNTER — Emergency Department (HOSPITAL_COMMUNITY)
Admission: EM | Admit: 2015-09-06 | Discharge: 2015-09-06 | Disposition: A | Discharge status: Home / Self Care | Payer: Medicaid Other | Attending: Emergency Medicine | Admitting: Emergency Medicine

## 2015-09-06 DIAGNOSIS — Z87440 Personal history of urinary (tract) infections: Secondary | ICD-10-CM | POA: Diagnosis not present

## 2015-09-06 DIAGNOSIS — G43909 Migraine, unspecified, not intractable, without status migrainosus: Secondary | ICD-10-CM | POA: Diagnosis not present

## 2015-09-06 DIAGNOSIS — R059 Cough, unspecified: Secondary | ICD-10-CM

## 2015-09-06 DIAGNOSIS — Z8651 Personal history of combat and operational stress reaction: Secondary | ICD-10-CM | POA: Diagnosis not present

## 2015-09-06 DIAGNOSIS — K2970 Gastritis, unspecified, without bleeding: Secondary | ICD-10-CM | POA: Diagnosis not present

## 2015-09-06 DIAGNOSIS — J45901 Unspecified asthma with (acute) exacerbation: Secondary | ICD-10-CM | POA: Insufficient documentation

## 2015-09-06 DIAGNOSIS — Z8619 Personal history of other infectious and parasitic diseases: Secondary | ICD-10-CM | POA: Diagnosis not present

## 2015-09-06 DIAGNOSIS — Z87828 Personal history of other (healed) physical injury and trauma: Secondary | ICD-10-CM | POA: Insufficient documentation

## 2015-09-06 DIAGNOSIS — E669 Obesity, unspecified: Secondary | ICD-10-CM | POA: Diagnosis not present

## 2015-09-06 DIAGNOSIS — R112 Nausea with vomiting, unspecified: Secondary | ICD-10-CM

## 2015-09-06 DIAGNOSIS — F17210 Nicotine dependence, cigarettes, uncomplicated: Secondary | ICD-10-CM | POA: Insufficient documentation

## 2015-09-06 DIAGNOSIS — G8929 Other chronic pain: Secondary | ICD-10-CM | POA: Insufficient documentation

## 2015-09-06 DIAGNOSIS — Z79899 Other long term (current) drug therapy: Secondary | ICD-10-CM | POA: Insufficient documentation

## 2015-09-06 DIAGNOSIS — M791 Myalgia: Secondary | ICD-10-CM | POA: Diagnosis not present

## 2015-09-06 DIAGNOSIS — Z79818 Long term (current) use of other agents affecting estrogen receptors and estrogen levels: Secondary | ICD-10-CM | POA: Diagnosis not present

## 2015-09-06 DIAGNOSIS — J069 Acute upper respiratory infection, unspecified: Secondary | ICD-10-CM | POA: Insufficient documentation

## 2015-09-06 DIAGNOSIS — R1013 Epigastric pain: Secondary | ICD-10-CM

## 2015-09-06 DIAGNOSIS — R51 Headache: Secondary | ICD-10-CM

## 2015-09-06 DIAGNOSIS — R519 Headache, unspecified: Secondary | ICD-10-CM

## 2015-09-06 DIAGNOSIS — R0981 Nasal congestion: Secondary | ICD-10-CM | POA: Diagnosis present

## 2015-09-06 DIAGNOSIS — Z8742 Personal history of other diseases of the female genital tract: Secondary | ICD-10-CM | POA: Diagnosis not present

## 2015-09-06 DIAGNOSIS — R6889 Other general symptoms and signs: Secondary | ICD-10-CM

## 2015-09-06 DIAGNOSIS — F329 Major depressive disorder, single episode, unspecified: Secondary | ICD-10-CM | POA: Diagnosis not present

## 2015-09-06 DIAGNOSIS — R05 Cough: Secondary | ICD-10-CM

## 2015-09-06 MED ORDER — RANITIDINE HCL 150 MG PO TABS: 150.0000 mg | ORAL_TABLET | Freq: Every day | ORAL | Status: DC

## 2015-09-06 MED ORDER — ONDANSETRON 4 MG PO TBDP
8.0000 mg | ORAL_TABLET | Freq: Once | ORAL | Status: AC
  Administered 2015-09-06: 8 mg via ORAL
  Filled 2015-09-06: qty 2

## 2015-09-06 MED ORDER — ONDANSETRON HCL 8 MG PO TABS: 8.0000 mg | ORAL_TABLET | Freq: Three times a day (TID) | ORAL | Status: DC | PRN

## 2015-09-06 MED ORDER — ALBUTEROL SULFATE (2.5 MG/3ML) 0.083% IN NEBU: 2.5000 mg | INHALATION_SOLUTION | RESPIRATORY_TRACT | Status: AC | PRN

## 2015-09-06 MED ORDER — LORATADINE 10 MG PO TABS: 10.0000 mg | ORAL_TABLET | Freq: Every day | ORAL | Status: DC

## 2015-09-06 MED ORDER — ACETAMINOPHEN 325 MG PO TABS
650.0000 mg | ORAL_TABLET | Freq: Once | ORAL | Status: AC
  Administered 2015-09-06: 650 mg via ORAL
  Filled 2015-09-06: qty 2

## 2015-09-06 MED ORDER — FLUTICASONE PROPIONATE 50 MCG/ACT NA SUSP: 2.0000 | Freq: Every day | NASAL | Status: DC
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
## 2015-09-06 NOTE — ED Notes (Signed)
Pt c/o flu like symptoms  

## 2015-09-06 NOTE — ED Notes (Signed)
Pt stable, ambulatory, states understanding of discharge instructions 

## 2015-09-06 NOTE — Discharge Instructions (Signed)
Continue to stay well-hydrated. Gargle warm salt water and spit it out. Continue to use Tylenol for pain or fever. Use Mucinex for cough suppression/expectoration of mucus. Use netipot and flonase to help with nasal congestion. May consider over-the-counter Benadryl or other antihistamine (claritin) to decrease secretions and for watery itchy eyes. Use your albuterol nebulizer as needed for wheezing/cough. Your abdominal pain is likely from gastritis from taking the over the counter cold medicines. You will need to take zantac as directed while taking the cold/flu medications, and avoid spicy/fatty/acidic foods. Avoid laying down flat within 30 minutes of eating. Avoid NSAIDs like ibuprofen on an empty stomach. Use zofran as needed for nausea.  Followup with your primary care doctor in 5-7 days for recheck of ongoing symptoms. Return to emergency department for emergent changing or worsening of symptoms.  Abdominal (belly) pain can be caused by many things. Your caregiver performed an examination and possibly ordered blood/urine tests and imaging (CT scan, x-rays, ultrasound). Many cases can be observed and treated at home after initial evaluation in the emergency department. Even though you are being discharged home, abdominal pain can be unpredictable. Therefore, you need a repeated exam if your pain does not resolve, returns, or worsens. Most patients with abdominal pain don't have to be admitted to the hospital or have surgery, but serious problems like appendicitis and gallbladder attacks can start out as nonspecific pain. Many abdominal conditions cannot be diagnosed in one visit, so follow-up evaluations are very important. SEEK IMMEDIATE MEDICAL ATTENTION IF YOU DEVELOP ANY OF THE FOLLOWING SYMPTOMS:  The pain does not go away or becomes severe.   A temperature above 101 develops.   Repeated vomiting occurs (multiple episodes).   The pain becomes localized to portions of the abdomen. The right  side could possibly be appendicitis. In an adult, the left lower portion of the abdomen could be colitis or diverticulitis.   Blood is being passed in stools or vomit (bright red or black tarry stools).   Return also if you develop chest pain, difficulty breathing, dizziness or fainting, or become confused, poorly responsive, or inconsolable (young children).  The constipation stays for more than 4 days.   There is belly (abdominal) or rectal pain.   You do not seem to be getting better.      Viral Infections A virus is a type of germ. Viruses can cause:  Minor sore throats.  Aches and pains.  Headaches.  Runny nose.  Rashes.  Watery eyes.  Tiredness.  Coughs.  Loss of appetite.  Feeling sick to your stomach (nausea).  Throwing up (vomiting).  Watery poop (diarrhea). HOME CARE   Only take medicines as told by your doctor.  Drink enough water and fluids to keep your pee (urine) clear or pale yellow. Sports drinks are a good choice.  Get plenty of rest and eat healthy. Soups and broths with crackers or rice are fine. GET HELP RIGHT AWAY IF:   You have a very bad headache.  You have shortness of breath.  You have chest pain or neck pain.  You have an unusual rash.  You cannot stop throwing up.  You have watery poop that does not stop.  You cannot keep fluids down.  You or your child has a temperature by mouth above 102 F (38.9 C), not controlled by medicine.  Your baby is older than 3 months with a rectal temperature of 102 F (38.9 C) or higher.  Your baby is 79 months old or  younger with a rectal temperature of 100.4 F (38 C) or higher. MAKE SURE YOU:   Understand these instructions.  Will watch this condition.  Will get help right away if you are not doing well or get worse.   This information is not intended to replace advice given to you by your health care provider. Make sure you discuss any questions you have with your health care  provider.   Document Released: 09/13/2008 Document Revised: 12/24/2011 Document Reviewed: 03/09/2015 Elsevier Interactive Patient Education 2016 [employer]  Sinus Headache A sinus headache occurs when the paranasal sinuses become clogged or swollen. Paranasal sinuses are air pockets within the bones of the face. Sinus headaches can range from mild to severe. CAUSES A sinus headache can result from various conditions that affect the sinuses, such as:  Colds.  Sinus infections.  Allergies. SYMPTOMS The main symptom of this condition is a headache that may feel like pain or pressure in the face, forehead, ears, or upper teeth. People who have a sinus headache often have other symptoms, such as:  Congested or runny nose.  Fever.  Inability to smell. Weather changes can make symptoms worse. DIAGNOSIS This condition may be diagnosed based on:  A physical exam and medical history.  Imaging tests, such as a CT scan and MRI, to check for problems with the sinuses.  A specialist may look into the sinuses with a tool that has a camera (endoscopy). TREATMENT Treatment for this condition depends on the cause.  Sinus pain that is caused by a sinus infection may be treated with antibiotic medicine.  Sinus pain that is caused by allergies may be helped by allergy medicines (antihistamines) and medicated nasal sprays.  Sinus pain that is caused by congestion may be helped by flushing the nose and sinuses with saline solution. HOME CARE INSTRUCTIONS  Take medicines only as directed by your health care provider.  If you were prescribed an antibiotic medicine, finish all of it even if you start to feel better.  If you have congestion, use a nasal spray to help reduce pressure.  If directed, apply a warm, moist washcloth to your face to help relieve pain. SEEK MEDICAL CARE IF:  You have headaches more than one time each week.  You have sensitivity to light or sound.  You have a  fever.  You feel sick to your stomach (nauseous) or you throw up (vomit).  Your headaches do not get better with treatment. Many people think that they have a sinus headache when they actually have migraines or tension headaches. SEEK IMMEDIATE MEDICAL CARE IF:  You have vision problems.  You have sudden, severe pain in your face or head.  You have a seizure.  You are confused.  You have a stiff neck.   This information is not intended to replace advice given to you by your health care provider. Make sure you discuss any questions you have with your health care provider.   Document Released: 11/08/2004 Document Revised: 02/15/2015 Document Reviewed: 09/27/2014 Elsevier Interactive Patient Education 2016 [employer] Inc[employer]  Nausea and Vomiting Nausea is a sick feeling that often comes before throwing up (vomiting). Vomiting is a reflex where stomach contents come out of your mouth. Vomiting can cause severe loss of body fluids (dehydration). Children and elderly adults can become dehydrated quickly, especially if they also have diarrhea. Nausea and vomiting are symptoms of a condition or disease. It is important to find the cause of your symptoms. CAUSES   Direct  irritation of the stomach lining. This irritation can result from increased acid production (gastroesophageal reflux disease), infection, food poisoning, taking certain medicines (such as nonsteroidal anti-inflammatory drugs), alcohol use, or tobacco use.  Signals from the brain.These signals could be caused by a headache, heat exposure, an inner ear disturbance, increased pressure in the brain from injury, infection, a tumor, or a concussion, pain, emotional stimulus, or metabolic problems.  An obstruction in the gastrointestinal tract (bowel obstruction).  Illnesses such as diabetes, hepatitis, gallbladder problems, appendicitis, kidney problems, cancer, sepsis, atypical symptoms of a heart attack, or eating  disorders.  Medical treatments such as chemotherapy and radiation.  Receiving medicine that makes you sleep (general anesthetic) during surgery. DIAGNOSIS Your caregiver may ask for tests to be done if the problems do not improve after a few days. Tests may also be done if symptoms are severe or if the reason for the nausea and vomiting is not clear. Tests may include:  Urine tests.  Blood tests.  Stool tests.  Cultures (to look for evidence of infection).  X-rays or other imaging studies. Test results can help your caregiver make decisions about treatment or the need for additional tests. TREATMENT You need to stay well hydrated. Drink frequently but in small amounts.You may wish to drink water, sports drinks, clear broth, or eat frozen ice pops or gelatin dessert to help stay hydrated.When you eat, eating slowly may help prevent nausea.There are also some antinausea medicines that may help prevent nausea. HOME CARE INSTRUCTIONS   Take all medicine as directed by your caregiver.  If you do not have an appetite, do not force yourself to eat. However, you must continue to drink fluids.  If you have an appetite, eat a normal diet unless your caregiver tells you differently.  Eat a variety of complex carbohydrates (rice, wheat, potatoes, bread), lean meats, yogurt, fruits, and vegetables.  Avoid high-fat foods because they are more difficult to digest.  Drink enough water and fluids to keep your urine clear or pale yellow.  If you are dehydrated, ask your caregiver for specific rehydration instructions. Signs of dehydration may include:  Severe thirst.  Dry lips and mouth.  Dizziness.  Dark urine.  Decreasing urine frequency and amount.  Confusion.  Rapid breathing or pulse. SEEK IMMEDIATE MEDICAL CARE IF:   You have blood or brown flecks (like coffee grounds) in your vomit.  You have black or bloody stools.  You have a severe headache or stiff neck.  You are  confused.  You have severe abdominal pain.  You have chest pain or trouble breathing.  You do not urinate at least once every 8 hours.  You develop cold or clammy skin.  You continue to vomit for longer than 24 to 48 hours.  You have a fever. MAKE SURE YOU:   Understand these instructions.  Will watch your condition.  Will get help right away if you are not doing well or get worse.   This information is not intended to replace advice given to you by your health care provider. Make sure you discuss any questions you have with your health care provider.   Document Released: 10/01/2005 Document Revised: 12/24/2011 Document Reviewed: 02/28/2011 Elsevier Interactive Patient Education 2016 Else[employer] Inc.  Gastritis, Adult Gastritis is soreness and puffiness (inflammation) of the lining of the stomach. If you do not get help, gastritis can cause bleeding and sores (ulcers) in the stomach. HOME CARE   Only take medicine as told by your doctor.  If you were given antibiotic medicines, take them as told. Finish the medicines even if you start to feel better.  Drink enough fluids to keep your pee (urine) clear or pale yellow.  Avoid foods and drinks that make your problems worse. Foods you may want to avoid include:  Caffeine or alcohol.  Chocolate.  Mint.  Garlic and onions.  Spicy foods.  Citrus fruits, including oranges, lemons, or limes.  Food containing tomatoes, including sauce, chili, salsa, and pizza.  Fried and fatty foods.  Eat small meals throughout the day instead of large meals. GET HELP RIGHT AWAY IF:   You have black or dark red poop (stools).  You throw up (vomit) blood. It may look like coffee grounds.  You cannot keep fluids down.  Your belly (abdominal) pain gets worse.  You have a fever.  You do not feel better after 1 week.  You have any other questions or concerns. MAKE SURE YOU:   Understand these instructions.  Will watch your  condition.  Will get help right away if you are not doing well or get worse.   This information is not intended to replace advice given to you by your health care provider. Make sure you discuss any questions you have with your health care provider.   Document Released: 03/19/2008 Document Revised: 12/24/2011 Document Reviewed: 11/14/2011 Elsevier Interactive Patient Education 2016 Else[employer] Inc.  Cough, Adult A cough helps to clear your throat and lungs. A cough may last only 2-3 weeks (acute), or it may last longer than 8 weeks (chronic). Many different things can cause a cough. A cough may be a sign of an illness or another medical condition. HOME CARE  Pay attention to any changes in your cough.  Take medicines only as told by your doctor.  If you were prescribed an antibiotic medicine, take it as told by your doctor. Do not stop taking it even if you start to feel better.  Talk with your doctor before you try using a cough medicine.  Drink enough fluid to keep your pee (urine) clear or pale yellow.  If the air is dry, use a cold steam vaporizer or humidifier in your home.  Stay away from things that make you cough at work or at home.  If your cough is worse at night, try using extra pillows to raise your head up higher while you sleep.  Do not smoke, and try not to be around smoke. If you need help quitting, ask your doctor.  Do not have caffeine.  Do not drink alcohol.  Rest as needed. GET HELP IF:  You have new problems (symptoms).  You cough up yellow fluid (pus).  Your cough does not get better after 2-3 weeks, or your cough gets worse.  Medicine does not help your cough and you are not sleeping well.  You have pain that gets worse or pain that is not helped with medicine.  You have a fever.  You are losing weight and you do not know why.  You have night sweats. GET HELP RIGHT AWAY IF:  You cough up blood.  You have trouble breathing.  Your  heartbeat is very fast.   This information is not intended to replace advice given to you by your health care provider. Make sure you discuss any questions you have with your health care provider.   Document Released: 06/14/2011 Document Revised: 06/22/2015 Document Reviewed: 12/08/2014 Elsevier Interactive Patient Education [employer] Else[employer] Inc.

## 2015-11-07 ENCOUNTER — Emergency Department (HOSPITAL_COMMUNITY)
Admission: EM | Admit: 2015-11-07 | Discharge: 2015-11-07 | Disposition: A | Discharge status: Home / Self Care | Payer: Medicaid Other | Attending: Emergency Medicine | Admitting: Emergency Medicine

## 2015-11-07 DIAGNOSIS — J45909 Unspecified asthma, uncomplicated: Secondary | ICD-10-CM | POA: Insufficient documentation

## 2015-11-07 DIAGNOSIS — F329 Major depressive disorder, single episode, unspecified: Secondary | ICD-10-CM | POA: Diagnosis not present

## 2015-11-07 DIAGNOSIS — E669 Obesity, unspecified: Secondary | ICD-10-CM | POA: Diagnosis not present

## 2015-11-07 DIAGNOSIS — G43501 Persistent migraine aura without cerebral infarction, not intractable, with status migrainosus: Secondary | ICD-10-CM | POA: Insufficient documentation

## 2015-11-07 DIAGNOSIS — G8929 Other chronic pain: Secondary | ICD-10-CM | POA: Diagnosis not present

## 2015-11-07 DIAGNOSIS — Z79899 Other long term (current) drug therapy: Secondary | ICD-10-CM | POA: Diagnosis not present

## 2015-11-07 DIAGNOSIS — Z87440 Personal history of urinary (tract) infections: Secondary | ICD-10-CM | POA: Insufficient documentation

## 2015-11-07 DIAGNOSIS — Z8619 Personal history of other infectious and parasitic diseases: Secondary | ICD-10-CM | POA: Diagnosis not present

## 2015-11-07 DIAGNOSIS — F17210 Nicotine dependence, cigarettes, uncomplicated: Secondary | ICD-10-CM | POA: Diagnosis not present

## 2015-11-07 DIAGNOSIS — Z7951 Long term (current) use of inhaled steroids: Secondary | ICD-10-CM | POA: Insufficient documentation

## 2015-11-07 DIAGNOSIS — Z3202 Encounter for pregnancy test, result negative: Secondary | ICD-10-CM | POA: Diagnosis not present

## 2015-11-07 DIAGNOSIS — R51 Headache: Secondary | ICD-10-CM | POA: Diagnosis present

## 2015-11-07 LAB — POC URINE PREG, ED: Preg Test, Ur: NEGATIVE

## 2015-11-07 MED ORDER — METOCLOPRAMIDE HCL 5 MG/ML IJ SOLN
10.0000 mg | Freq: Once | INTRAMUSCULAR | Status: AC
  Administered 2015-11-07: 10 mg via INTRAMUSCULAR
  Filled 2015-11-07: qty 2

## 2015-11-07 MED ORDER — DEXAMETHASONE SODIUM PHOSPHATE 10 MG/ML IJ SOLN
10.0000 mg | Freq: Once | INTRAMUSCULAR | Status: AC
  Administered 2015-11-07: 10 mg via INTRAMUSCULAR
  Filled 2015-11-07: qty 1

## 2015-11-07 MED ORDER — ACETAMINOPHEN 500 MG PO TABS
1000.0000 mg | ORAL_TABLET | Freq: Once | ORAL | Status: AC
  Administered 2015-11-07: 1000 mg via ORAL
  Filled 2015-11-07: qty 2

## 2015-11-07 MED ORDER — DIPHENHYDRAMINE HCL 25 MG PO CAPS
50.0000 mg | ORAL_CAPSULE | Freq: Once | ORAL | Status: AC
  Administered 2015-11-07: 50 mg via ORAL
  Filled 2015-11-07: qty 2

## 2015-11-07 NOTE — Discharge Instructions (Signed)
If you were given medicines take as directed.  If you are on coumadin or contraceptives realize their levels and effectiveness is altered by many different medicines.  If you have any reaction (rash, tongues swelling, other) to the medicines stop taking and see a physician.    If your blood pressure was elevated in the ER make sure you follow up for management with a primary doctor or return for chest pain, shortness of breath or stroke symptoms.  Please follow up as directed and return to the ER or see a physician for new or worsening symptoms.  Thank you. Filed Vitals:   11/07/15 0745  BP: 121/86  Pulse: 82  Temp: 98.3 F (36.8 C)  TempSrc: Oral  Resp: 16  Height:  (1.702 m)  Weight: 216 lb (97.977 kg)  SpO2: 96%
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
## 2015-11-07 NOTE — ED Notes (Signed)
Pt in from home c/o Migraine x 3 wks, pt reports seeing her neurologist 03/2015, pt denies pain relief with prescription pain meds, pt unable to state Rx medication, pt A&O x4

## 2016-02-07 ENCOUNTER — Encounter (HOSPITAL_COMMUNITY): Payer: Self-pay | Admitting: Student

## 2016-02-07 ENCOUNTER — Inpatient Hospital Stay (HOSPITAL_COMMUNITY)
Admission: AD | Admit: 2016-02-07 | Discharge: 2016-02-07 | Disposition: A | Discharge status: Home / Self Care | Payer: Medicaid Other | Source: Ambulatory Visit | Attending: Family Medicine | Admitting: Family Medicine

## 2016-02-07 DIAGNOSIS — F17210 Nicotine dependence, cigarettes, uncomplicated: Secondary | ICD-10-CM | POA: Diagnosis not present

## 2016-02-07 DIAGNOSIS — Z113 Encounter for screening for infections with a predominantly sexual mode of transmission: Secondary | ICD-10-CM

## 2016-02-07 DIAGNOSIS — Z79899 Other long term (current) drug therapy: Secondary | ICD-10-CM | POA: Diagnosis not present

## 2016-02-07 DIAGNOSIS — A5901 Trichomonal vulvovaginitis: Secondary | ICD-10-CM | POA: Diagnosis not present

## 2016-02-07 DIAGNOSIS — Z888 Allergy status to other drugs, medicaments and biological substances status: Secondary | ICD-10-CM | POA: Insufficient documentation

## 2016-02-07 DIAGNOSIS — A499 Bacterial infection, unspecified: Secondary | ICD-10-CM | POA: Diagnosis not present

## 2016-02-07 DIAGNOSIS — J45909 Unspecified asthma, uncomplicated: Secondary | ICD-10-CM | POA: Insufficient documentation

## 2016-02-07 DIAGNOSIS — B9689 Other specified bacterial agents as the cause of diseases classified elsewhere: Secondary | ICD-10-CM

## 2016-02-07 DIAGNOSIS — N760 Acute vaginitis: Secondary | ICD-10-CM | POA: Insufficient documentation

## 2016-02-07 DIAGNOSIS — N898 Other specified noninflammatory disorders of vagina: Secondary | ICD-10-CM | POA: Diagnosis present

## 2016-02-07 LAB — URINALYSIS, ROUTINE W REFLEX MICROSCOPIC
Bilirubin Urine: NEGATIVE
GLUCOSE, UA: NEGATIVE mg/dL
HGB URINE DIPSTICK: NEGATIVE
KETONES UR: NEGATIVE mg/dL
Nitrite: NEGATIVE
PROTEIN: NEGATIVE mg/dL
Specific Gravity, Urine: 1.02 (ref 1.005–1.030)
pH: 6 (ref 5.0–8.0)

## 2016-02-07 LAB — URINE MICROSCOPIC-ADD ON

## 2016-02-07 LAB — WET PREP, GENITAL
Sperm: NONE SEEN
YEAST WET PREP: NONE SEEN

## 2016-02-07 LAB — CBC
HCT: 37.9 % (ref 36.0–46.0)
Hemoglobin: 13 g/dL (ref 12.0–15.0)
MCH: 29.5 pg (ref 26.0–34.0)
MCHC: 34.3 g/dL (ref 30.0–36.0)
MCV: 86.1 fL (ref 78.0–100.0)
PLATELETS: 255 10*3/uL (ref 150–400)
RBC: 4.4 MIL/uL (ref 3.87–5.11)
RDW: 14.8 % (ref 11.5–15.5)
WBC: 10 10*3/uL (ref 4.0–10.5)

## 2016-02-07 LAB — POCT PREGNANCY, URINE: PREG TEST UR: NEGATIVE

## 2016-02-07 MED ORDER — METRONIDAZOLE 500 MG PO TABS
500.0000 mg | ORAL_TABLET | Freq: Two times a day (BID) | ORAL | Status: DC
Start: 2016-02-07 — End: 2016-09-17

## 2016-02-07 MED ORDER — FLUCONAZOLE 150 MG PO TABS: 150.0000 mg | ORAL_TABLET | Freq: Every day | ORAL | Status: DC

## 2016-02-07 MED ORDER — METRONIDAZOLE 500 MG PO TABS
2000.0000 mg | ORAL_TABLET | Freq: Once | ORAL | Status: AC
  Administered 2016-02-07: 2000 mg via ORAL
  Filled 2016-02-07: qty 4

## 2016-02-07 NOTE — Discharge Instructions (Signed)
Trichomoniasis °Trichomoniasis is an infection caused by an organism called Trichomonas. The infection can affect both women and men. In women, the outer female genitalia and the vagina are affected. In men, the penis is mainly affected, but the prostate and other reproductive organs can also be involved. Trichomoniasis is a sexually transmitted infection (STI) and is most often passed to another person through sexual contact.  °RISK FACTORS °· Having unprotected sexual intercourse. °· Having sexual intercourse with an infected partner. °SIGNS AND SYMPTOMS  °Symptoms of trichomoniasis in women include: °· Abnormal gray-green frothy vaginal discharge. °· Itching and irritation of the vagina. °· Itching and irritation of the area outside the vagina. °Symptoms of trichomoniasis in men include:  °· Penile discharge with or without pain. °· Pain during urination. This results from inflammation of the urethra. °DIAGNOSIS  °Trichomoniasis may be found during a Pap test or physical exam. Your health care provider may use one of the following methods to help diagnose this infection: °· Testing the pH of the vagina with a test tape. °· Using a vaginal swab test that checks for the Trichomonas organism. A test is available that provides results within a few minutes. °· Examining a urine sample. °· Testing vaginal secretions. °Your health care provider may test you for other STIs, including HIV. °TREATMENT  °· You may be given medicine to fight the infection. Women should inform their health care provider if they could be or are pregnant. Some medicines used to treat the infection should not be taken during pregnancy. °· Your health care provider may recommend over-the-counter medicines or creams to decrease itching or irritation. °· Your sexual partner will need to be treated if infected. °· Your health care provider may test you for infection again 3 months after treatment. °HOME CARE INSTRUCTIONS  °· Take medicines only as  directed by your health care provider. °· Take over-the-counter medicine for itching or irritation as directed by your health care provider. °· Do not have sexual intercourse while you have the infection. °· Women should not douche or wear tampons while they have the infection. °· Discuss your infection with your partner. Your partner may have gotten the infection from you, or you may have gotten it from your partner. °· Have your sex partner get examined and treated if necessary. °· Practice safe, informed, and protected sex. °· See your health care provider for other STI testing. °SEEK MEDICAL CARE IF:  °· You still have symptoms after you finish your medicine. °· You develop abdominal pain. °· You have pain when you urinate. °· You have bleeding after sexual intercourse. °· You develop a rash. °· Your medicine makes you sick or makes you throw up (vomit). °MAKE SURE YOU: °· Understand these instructions. °· Will watch your condition. °· Will get help right away if you are not doing well or get worse. °  °This information is not intended to replace advice given to you by your health care provider. Make sure you discuss any questions you have with your health care provider. °  °Document Released: 03/27/2001 Document Revised: 10/22/2014 Document Reviewed: 07/13/2013 °Elsevier Interactive Patient Education ©2016 [employer] ° °Expedited Partner Therapy:  °Information Sheet for Patients and Partners  °            ° °You have been offered expedited partner therapy (EPT). This information sheet contains important information and warnings you need to be aware of, so please read it carefully.  ° °Expedited Partner Therapy (EPT) is the   clinical practice of treating the sexual partners of persons who receive chlamydia, gonorrhea, or trichomoniasis diagnoses by providing medications or prescriptions to the patient. Patients then provide partners with these therapies without the health-care provider having examined the  partner. In other words, EPT is a convenient, fast and private way for patients to help their sexual partners get treated.  ° °Chlamydia and gonorrhea are bacterial infections you get from having sex with a person who is already infected. Trichomoniasis (or “trich”) is a very common sexually transmitted infection (STI) that is caused by infection with a protozoan parasite called Trichomonas vaginalis.  Many people with these infections don’t know it because they feel fine, but without treatment these infections can cause serious health problems, such as pelvic inflammatory disease, ectopic pregnancy, infertility and increased risk of HIV.  ° °It is important to get treated as soon as possible to protect your health, to avoid spreading these infections to others, and to prevent yourself from becoming re-infected. The good news is these infections can be easily cured with proper antibiotic medicine. The best way to take care of your self is to see a doctor or go to your local health department. If you are not able to see a doctor or other medical provider, you should take EPT.  ° ° °Recommended Medication: °EPT for Chlamydia:  Azithromycin (Zithromax) 1 gram orally in a single dose °EPT for Gonorrhea:  Cefixime (Suprax) 400 milligrams orally in a single dose PLUS azithromycin (Zithromax) 1 gram orally in a single dose °EPT for Trichomoniasis:  Metronidazole (Flagyl) 2 grams orally in a single dose ° ° °These medicines are very safe. However, you should not take them if you have ever had an allergic reaction (like a rash) to any of these medicines: azithromycin (Zithromax), erythromycin, clarithromycin (Biaxin), metronidazole (Flagyl), tinidazole (Tindimax). If you are uncertain about whether you have an allergy, call your medical provider or pharmacist before taking this medicine. If you have a serious, long-term illness like kidney, liver or heart disease, colitis or stomach problems, or you are currently taking  other prescription medication, talk to your provider before taking this medication.  ° °Women: If you have lower belly pain, pain during sex, vomiting, or a fever, do not take this medicine. Instead, you should see a medical provider to be certain you do not have pelvic inflammatory disease (PID). PID can be serious and lead to infertility, pregnancy problems or chronic pelvic pain.  ° °Pregnant Women: It is very important for you to see a doctor to get pregnancy services and pre-natal care. These antibiotics for EPT are safe for pregnant women, but you still need to see a medical provider as soon as possible. It is also important to note that Doxycycline is an alternative therapy for chlamydia, but it should not be taken by someone who is pregnant.  ° °Men: If you have pain or swelling in the testicles or a fever, do not take this medicine and see a medical provider.    ° °Men who have sex with men (MSM): MSM in [place] continue to experience high rates of syphilis and HIV. Many MSM with gonorrhea or chlamydia could also have syphilis and/or HIV and not know it. If you are a man who has sex with other men, it is very important that you see a medical provider and are tested for HIV and syphilis. EPT is not recommended for gonorrhea for MSM.  Recommended treatment for gonorrhea for MSM is Rocephin (shot) AND   azithromycin due to decreased cure rate.  Please see your medical provider if this is the case.   ° °Along with this information sheet is a prescription for the medicine. If you receive a prescription it will be in your name and will indicate your date of birth, or it will be in the name of “Expedited Partner Therapy”.   In either case, you can have the prescription filled at a pharmacy. You will be responsible for the cost of the medicine, unless you have prescription drug coverage. In that case, you could provide your name so the pharmacy could bill your health plan.  ° °Take the medication as directed.  Some people will have a mild, upset stomach, which does not last long. AVOID alcohol 24 hours after taking metronidazole (Flagyl) to reduce the possibility of a disulfiram-like reaction (severe vomiting and abdominal pain).  After taking the medicine, do not have sex for 7 days. Do not share this medicine or give it to anyone else. It is important to tell everyone you have had sex with in the last 60 days that they need to go and get tested for sexually transmitted infections.  ° °Ways to prevent these and other sexually transmitted infections (STIs):  ° °• Abstain from sex. This is the only sure way to avoid getting an STI.  °• Use barrier methods, such as condoms, consistently and correctly.  °• Limit the number of sexual partners.  °• Have regular physical exams, including testing for STIs.  ° °For more information about EPT or other issues pertaining to an STI, please contact your medical provider or the [place] [facility] at [phone] or [email or web address]   ° °

## 2016-02-07 NOTE — MAU Note (Signed)
As treated for a yeast infection,(diflucan)   Still having symptoms.  Called office and can not get in until the end of the year.  Thinking ? vaginitis

## 2016-02-07 NOTE — MAU Note (Signed)
Pt was treated for a yeast infection last week but is still having a lot of brown or white discharge and it now has an odor.
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
## 2016-02-08 LAB — GC/CHLAMYDIA PROBE AMP (~~LOC~~) NOT AT ARMC
CHLAMYDIA, DNA PROBE: NEGATIVE
NEISSERIA GONORRHEA: NEGATIVE

## 2016-02-08 LAB — HIV ANTIBODY (ROUTINE TESTING W REFLEX): HIV Screen 4th Generation wRfx: NONREACTIVE

## 2016-02-08 LAB — RPR: RPR Ser Ql: NONREACTIVE
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
## 2016-07-23 ENCOUNTER — Encounter: Payer: Self-pay | Admitting: Neurology

## 2016-07-23 ENCOUNTER — Ambulatory Visit (INDEPENDENT_AMBULATORY_CARE_PROVIDER_SITE_OTHER): Payer: Medicaid Other | Admitting: Neurology

## 2016-07-23 VITALS — BP 124/84 | HR 80 | Resp 20 | Ht 67.0 in | Wt 212.0 lb

## 2016-07-23 DIAGNOSIS — G43701 Chronic migraine without aura, not intractable, with status migrainosus: Secondary | ICD-10-CM | POA: Diagnosis not present

## 2016-07-23 MED ORDER — ZONISAMIDE 50 MG PO CAPS: 50.0000 mg | ORAL_CAPSULE | Freq: Every day | ORAL | 2 refills | Status: DC

## 2016-07-23 MED ORDER — BUTORPHANOL TARTRATE 10 MG/ML NA SOLN: 1.0000 | NASAL | 0 refills | Status: DC | PRN
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
## 2016-07-23 NOTE — Addendum Note (Signed)
Addended by: Melvyn NovasHMEIER, Vanna Shavers on: 07/23/2016 04:29 PM   Modules accepted: Orders

## 2016-07-23 NOTE — Patient Instructions (Signed)
Zonisamide capsules What is this medicine? ZONISAMIDE (zoe NIS a mide) is used to control partial seizures in adults with epilepsy. This medicine may be used for other purposes; ask your health care provider or pharmacist if you have questions. What should I tell my health care provider before I take this medicine? They need to know if you have any of these conditions: -dehydrated -diarrhea -history of metabolic acidosis (too much acid in your blood) -ketogenic diet -kidney disease -liver disease -lung disease -osteoporosis -suicidal thoughts, plans, or attempt; a previous suicide attempt by you or a family member -an unusual or allergic reaction to zonisamide, sulfa drugs, other medicines, foods, dyes, or preservatives -pregnant or trying to get pregnant -breast-feeding How should I use this medicine? Take this medicine by mouth with a glass of water. Follow the directions on the prescription label. Swallow whole. Do not break open the capsule. This medicine may be taken with or without food. Take your doses at regular intervals. Do not take your medicine more often than directed. Do not stop taking this medicine unless instructed by your doctor or health care professional. A special MedGuide will be given to you by the pharmacist with each prescription and refill. Be sure to read this information carefully each time. Talk to your pediatrician regarding the use of this medicine in children. While this drug may be prescribed for children as young as 16 years of age for selected conditions, precautions do apply. Overdosage: If you think you have taken too much of this medicine contact a poison control center or emergency room at once. NOTE: This medicine is only for you. Do not share this medicine with others. What if I miss a dose? If you miss a dose, take it as soon as you can. If it is almost time for your next dose, take only that dose. Do not take double or extra doses. What may  interact with this medicine? -barbiturates like phenobarbital -carbamazepine -phenytoin This list may not describe all possible interactions. Give your health care provider a list of all the medicines, herbs, non-prescription drugs, or dietary supplements you use. Also tell them if you smoke, drink alcohol, or use illegal drugs. Some items may interact with your medicine. What should I watch for while using this medicine? Visit your doctor or health care professional for regular checks on your progress. Wear a medical identification bracelet or chain to say you have epilepsy, and carry a card that lists all your medications. It is important to take this medicine exactly as directed. When first starting treatment, your dose will need to be adjusted slowly. It may take weeks or months before your dose is stable. You should contact your doctor or health care professional if your seizures get worse or if you have any new types of seizures. Do not stop taking except on your doctor's advice. You may develop a severe reaction. Your doctor will tell you how much medicine to take. You may get drowsy, dizzy, or have blurred vision. Do not drive, use machinery, or do anything that needs mental alertness until you know how this medicine affects you. To reduce dizzy or fainting spells, do not sit or stand up quickly, especially if you are an older patient. Alcohol can increase drowsiness and dizziness. Avoid alcoholic drinks. Avoid extreme heat. This medicine can cause you to sweat less than normal. Your body temperature could increase to dangerous levels, which may lead to heat stroke. This medicine may increase the chance of developing metabolic   acidosis. If left untreated, this can cause kidney stones, bone disease, or slowed growth in children. Symptoms include breathing fast, fatigue, loss of appetite, irregular heartbeat, or loss of consciousness. Call your doctor immediately if you experience any of these side  effects. Also, tell your doctor about any surgery you plan on having while taking this medicine since this may increase your risk for metabolic acidosis. This medicines may increase the risk of kidney stones. Drinking 6 to 8 glasses of water a day may help prevent the formation of kidney stones. The use of this medicine may increase the chance of suicidal thoughts or actions. Pay special attention to how you are responding while on this medicine. Any worsening of mood, or thoughts of suicide or dying should be reported to your health care professional right away. Women who become pregnant while using this medicine may enroll in the [place] Antiepileptic Drug Pregnancy Registry by calling 1-888-233-2334. This registry collects information about the safety of antiepileptic drug use during pregnancy. What side effects may I notice from receiving this medicine? Side effects that you should report to your doctor or health care professional immediately: -allergic reactions like skin rash, itching or hives, swelling of the face, lips, or tongue -decreased sweating or a rise in body temperature, especially in patients under 17 years old -difficulty breathing or tightening of the throat -feeling faint or lightheaded, falls -fever, sore throat, sores in your mouth, or bruising easily -hallucination, loss of contact with reality -irregular heartbeat -loss of appetite -redness, blistering, peeling or loosening of the skin, including inside the mouth -severe drowsiness, difficulty concentrating, or coordination problems -speech or language problems -sudden back pain, abdominal pain, pain when urinating, bloody or dark urine -suicidal thoughts or depression -unusual changes in behavior or mood -unusually weak or tired -vomiting Side effects that usually do not require medical attention (report to your doctor or health care professional if they continue or are bothersome): -headache -nausea This  list may not describe all possible side effects. Call your doctor for medical advice about side effects. You may report side effects to FDA at 1-800-FDA-1088. Where should I keep my medicine? Keep out of reach of children. Store at room temperature between 15 and 30 degrees C (59 and 86 degrees F). Keep in a dry place protected from light. Throw away any unused medicine after the expiration date. NOTE: This sheet is a summary. It may not cover all possible information. If you have questions about this medicine, talk to your doctor, pharmacist, or health care provider.    2016, Elsevier/Gold Standard. (2010-07-13 15:16:42)  

## 2016-07-24 ENCOUNTER — Telehealth: Payer: Self-pay | Admitting: Neurology

## 2016-07-24 NOTE — Telephone Encounter (Signed)
Patient is calling stating [facility] states a prior authorization is needed for medication butorphanol (STADOL) 10 MG/ML nasal spray.

## 2016-07-25 NOTE — Telephone Encounter (Signed)
PA for stadol completed. [place] Controlled Substance database checked, pt has not used controlled substances within this year. [facility]

## 2016-07-26 NOTE — Telephone Encounter (Signed)
[facility] [phone] called to check status of PA for butorphanol (STADOL) 10 MG/ML nasal spray

## 2016-07-26 NOTE — Telephone Encounter (Signed)
[facility] to [facility]. Pt's pa for stadol was denied. I called [facility] and advised them of this.  I called pt and advised her of this. Pt raised her voice and said "Why did they deny it? Why can't your office get it approved? I need to be seen again if you can't give me a medication that I don't need to pay for. I need to be see in the office! I have a problem and you aren't helping me!"   I advised her that the out of pocket price of [facility]. Pt again, raised her voice, demanding to be seen in the office and demanded that I call her back.  I spoke to Dr. Vickey Hugerohmeier. Dr. Vickey Hugerohmeier says that she does not recommend anything else for pt's migraines at this time and recommends that she pay out of pocket for it, and if that won't work, she can speak to her PCP regarding her headaches.  I called pt again and advised her of this information. She raised her voice and became profane. Pt demanded to know why we would make her pay out of pocket for something knowing that "money doesn't grow on trees" and "I can't work while I have migraines." She says that we either need to fill out permanent disability for her or we need to admit her to the hospital to get a headache infusion. I advised her that our office is closed at this time but would send this to Dr. Vickey Hugerohmeier to review tomorrow. Pt verbalized understanding.

## 2016-07-27 MED ORDER — BUTORPHANOL TARTRATE 10 MG/ML NA SOLN: 1.0000 | NASAL | 0 refills | Status: DC | PRN

## 2016-07-27 NOTE — Telephone Encounter (Signed)
Patient called to advise, [facility] was told the reason PA was denied for stadol was because the prescription was filled out wrong.

## 2016-07-27 NOTE — Telephone Encounter (Signed)
Mrs Margaret Cline will be dismissed.   I do not tolerate profanity towards my staff , my colleagues and will have to send her back to PCP.  I cannot give infusions in office due to infusion services not contracting with medicaid.  She can present to the ED for depakote infusions, as we are not able to give them here. CD   Dismissed from [facility].   Melvyn Novasarmen Deni Berti, MD

## 2016-07-27 NOTE — Telephone Encounter (Signed)
error 

## 2016-07-27 NOTE — Addendum Note (Signed)
Addended by: Melvyn NovasHMEIER, Linkin Vizzini on: 07/27/2016 12:23 PM   Modules accepted: Orders

## 2016-07-31 ENCOUNTER — Encounter: Payer: Self-pay | Admitting: Neurology

## 2016-07-31 NOTE — Telephone Encounter (Signed)
We have documented her as Migraine with status migrainosus, and that she is not an opiate abuser, has not received this medication in the past.   That is standart documentation, CD
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
## 2016-08-06 ENCOUNTER — Emergency Department (HOSPITAL_COMMUNITY)
Admission: EM | Admit: 2016-08-06 | Discharge: 2016-08-06 | Disposition: A | Discharge status: Home / Self Care | Payer: Medicaid Other | Attending: Emergency Medicine | Admitting: Emergency Medicine

## 2016-08-06 DIAGNOSIS — R51 Headache: Secondary | ICD-10-CM | POA: Insufficient documentation

## 2016-08-06 DIAGNOSIS — F17210 Nicotine dependence, cigarettes, uncomplicated: Secondary | ICD-10-CM | POA: Insufficient documentation

## 2016-08-06 DIAGNOSIS — J45909 Unspecified asthma, uncomplicated: Secondary | ICD-10-CM | POA: Insufficient documentation

## 2016-08-06 DIAGNOSIS — R519 Headache, unspecified: Secondary | ICD-10-CM

## 2016-08-06 MED ORDER — PROCHLORPERAZINE EDISYLATE 5 MG/ML IJ SOLN: 10.0000 mg | Freq: Once | INTRAMUSCULAR | Status: DC

## 2016-08-06 MED ORDER — KETOROLAC TROMETHAMINE 30 MG/ML IJ SOLN: 30.0000 mg | Freq: Once | INTRAMUSCULAR | Status: DC

## 2016-08-06 MED ORDER — SODIUM CHLORIDE 0.9 % IV BOLUS (SEPSIS)
1000.0000 mL | Freq: Once | INTRAVENOUS | Status: AC
  Administered 2016-08-06: 1000 mL via INTRAVENOUS

## 2016-08-06 MED ORDER — PROCHLORPERAZINE EDISYLATE 5 MG/ML IJ SOLN
10.0000 mg | Freq: Once | INTRAMUSCULAR | Status: AC
  Administered 2016-08-06: 10 mg via INTRAVENOUS
  Filled 2016-08-06: qty 2

## 2016-08-06 MED ORDER — METOCLOPRAMIDE HCL 5 MG/ML IJ SOLN
10.0000 mg | Freq: Once | INTRAMUSCULAR | Status: AC
Start: 2016-08-06 — End: 2016-08-06
  Administered 2016-08-06: 10 mg via INTRAVENOUS
  Filled 2016-08-06: qty 2

## 2016-08-06 MED ORDER — KETOROLAC TROMETHAMINE 30 MG/ML IJ SOLN
30.0000 mg | Freq: Once | INTRAMUSCULAR | Status: AC
Start: 2016-08-06 — End: 2016-08-06
  Administered 2016-08-06: 30 mg via INTRAVENOUS
  Filled 2016-08-06: qty 1

## 2016-08-06 MED ORDER — BUTALBITAL-APAP-CAFFEINE 50-325-40 MG PO TABS: 1.0000 | ORAL_TABLET | Freq: Four times a day (QID) | ORAL | 0 refills | Status: DC | PRN

## 2016-08-06 MED ORDER — DIPHENHYDRAMINE HCL 50 MG/ML IJ SOLN
25.0000 mg | Freq: Once | INTRAMUSCULAR | Status: AC
  Administered 2016-08-06: 25 mg via INTRAVENOUS
  Filled 2016-08-06: qty 1

## 2016-08-06 MED ORDER — BUTALBITAL-APAP-CAFFEINE 50-325-40 MG PO TABS
1.0000 | ORAL_TABLET | Freq: Once | ORAL | Status: AC
  Administered 2016-08-06: 1 via ORAL
  Filled 2016-08-06: qty 1

## 2016-08-06 NOTE — ED Triage Notes (Signed)
Headaches  For years now they aqre worse and since yesterday no relief states is nauseated
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
## 2016-08-06 NOTE — Discharge Instructions (Signed)
Take the prescribed medication as directed. Follow-up with your primary care doctor for new referral to neurology. Return to the ED for new or worsening symptoms.

## 2016-09-17 ENCOUNTER — Encounter (HOSPITAL_COMMUNITY): Payer: Self-pay | Admitting: Emergency Medicine

## 2016-09-17 ENCOUNTER — Emergency Department (HOSPITAL_COMMUNITY): Payer: Medicaid Other

## 2016-09-17 ENCOUNTER — Emergency Department (HOSPITAL_COMMUNITY)
Admission: EM | Admit: 2016-09-17 | Discharge: 2016-09-17 | Disposition: A | Discharge status: Home / Self Care | Payer: Medicaid Other | Attending: Emergency Medicine | Admitting: Emergency Medicine

## 2016-09-17 DIAGNOSIS — R51 Headache: Secondary | ICD-10-CM | POA: Insufficient documentation

## 2016-09-17 DIAGNOSIS — F17210 Nicotine dependence, cigarettes, uncomplicated: Secondary | ICD-10-CM | POA: Diagnosis not present

## 2016-09-17 DIAGNOSIS — R55 Syncope and collapse: Secondary | ICD-10-CM | POA: Diagnosis not present

## 2016-09-17 DIAGNOSIS — R519 Headache, unspecified: Secondary | ICD-10-CM

## 2016-09-17 DIAGNOSIS — J45909 Unspecified asthma, uncomplicated: Secondary | ICD-10-CM | POA: Insufficient documentation

## 2016-09-17 LAB — BASIC METABOLIC PANEL
ANION GAP: 7 (ref 5–15)
BUN: 9 mg/dL (ref 6–20)
CHLORIDE: 107 mmol/L (ref 101–111)
CO2: 25 mmol/L (ref 22–32)
Calcium: 8.6 mg/dL — ABNORMAL LOW (ref 8.9–10.3)
Creatinine, Ser: 0.63 mg/dL (ref 0.44–1.00)
GFR calc non Af Amer: >60 mL/min (ref >60)
Glucose, Bld: 89 mg/dL (ref 65–99)
POTASSIUM: 3.7 mmol/L (ref 3.5–5.1)
SODIUM: 139 mmol/L (ref 135–145)

## 2016-09-17 LAB — CBC WITH DIFFERENTIAL/PLATELET
BASOS ABS: 0 10*3/uL (ref 0.0–0.1)
BASOS PCT: 0 %
EOS ABS: 0.2 10*3/uL (ref 0.0–0.7)
Eosinophils Relative: 2 %
HEMATOCRIT: 37.3 % (ref 36.0–46.0)
HEMOGLOBIN: 12.3 g/dL (ref 12.0–15.0)
Lymphocytes Relative: 33 %
Lymphs Abs: 3.6 10*3/uL (ref 0.7–4.0)
MCH: 29.4 pg (ref 26.0–34.0)
MCHC: 33 g/dL (ref 30.0–36.0)
MCV: 89.2 fL (ref 78.0–100.0)
MONOS PCT: 4 %
Monocytes Absolute: 0.5 10*3/uL (ref 0.1–1.0)
NEUTROS ABS: 6.8 10*3/uL (ref 1.7–7.7)
NEUTROS PCT: 61 %
Platelets: 249 10*3/uL (ref 150–400)
RBC: 4.18 MIL/uL (ref 3.87–5.11)
RDW: 14.7 % (ref 11.5–15.5)
WBC: 11.1 10*3/uL — AB (ref 4.0–10.5)

## 2016-09-17 LAB — I-STAT BETA HCG BLOOD, ED (MC, WL, AP ONLY)

## 2016-09-17 MED ORDER — SODIUM CHLORIDE 0.9 % IV BOLUS (SEPSIS)
1000.0000 mL | Freq: Once | INTRAVENOUS | Status: AC
  Administered 2016-09-17: 1000 mL via INTRAVENOUS

## 2016-09-17 MED ORDER — DEXAMETHASONE SODIUM PHOSPHATE 10 MG/ML IJ SOLN
10.0000 mg | Freq: Once | INTRAMUSCULAR | Status: AC
  Administered 2016-09-17: 10 mg via INTRAVENOUS
  Filled 2016-09-17: qty 1

## 2016-09-17 MED ORDER — PROCHLORPERAZINE EDISYLATE 5 MG/ML IJ SOLN
10.0000 mg | Freq: Once | INTRAMUSCULAR | Status: AC
Start: 2016-09-17 — End: 2016-09-17
  Administered 2016-09-17: 10 mg via INTRAVENOUS
  Filled 2016-09-17: qty 2

## 2016-09-17 MED ORDER — DIPHENHYDRAMINE HCL 50 MG/ML IJ SOLN
25.0000 mg | Freq: Once | INTRAMUSCULAR | Status: AC
Start: 2016-09-17 — End: 2016-09-17
  Administered 2016-09-17: 25 mg via INTRAVENOUS
  Filled 2016-09-17: qty 1

## 2016-09-17 MED ORDER — LIDOCAINE HCL 4 % EX SOLN
Freq: Once | CUTANEOUS | Status: AC
  Administered 2016-09-17: 16:00:00 via TOPICAL
  Filled 2016-09-17: qty 50

## 2016-09-17 MED ORDER — LIDOCAINE 4 % EX CREA
TOPICAL_CREAM | Freq: Once | CUTANEOUS | Status: DC
  Filled 2016-09-17: qty 5

## 2016-09-17 MED ORDER — KETOROLAC TROMETHAMINE 30 MG/ML IJ SOLN
30.0000 mg | Freq: Once | INTRAMUSCULAR | Status: AC
  Administered 2016-09-17: 30 mg via INTRAVENOUS
  Filled 2016-09-17: qty 1
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
## 2016-09-17 NOTE — ED Notes (Signed)
ED Provider at bedside. 

## 2016-09-17 NOTE — ED Triage Notes (Signed)
Per EMS, pt c/o migraine radiating into occipital area and neck onset this morning, syncopal episode x about 30 seconds, fell from standing position. No head injury. Some flank pain from fall. Hx of same, takes medications.

## 2016-09-17 NOTE — ED Notes (Signed)
Patient is A & O x4.  She understood discharge instructions. 

## 2016-09-17 NOTE — Discharge Instructions (Signed)
Please follow with your primary care doctor in the next 2 days for a check-up. They must obtain records for further management.  ° °Do not hesitate to return to the Emergency Department for any new, worsening or concerning symptoms.  ° °

## 2016-09-17 NOTE — ED Notes (Signed)
Pt transported to CT. ABCD's intact.

## 2016-12-10 ENCOUNTER — Encounter: Payer: Self-pay | Admitting: Obstetrics and Gynecology

## 2016-12-10 ENCOUNTER — Ambulatory Visit (INDEPENDENT_AMBULATORY_CARE_PROVIDER_SITE_OTHER): Payer: Medicaid Other | Admitting: Obstetrics and Gynecology

## 2016-12-10 VITALS — BP 129/92 | HR 80 | Wt 219.0 lb

## 2016-12-10 DIAGNOSIS — Z0000 Encounter for general adult medical examination without abnormal findings: Secondary | ICD-10-CM

## 2016-12-10 DIAGNOSIS — Z01419 Encounter for gynecological examination (general) (routine) without abnormal findings: Secondary | ICD-10-CM

## 2016-12-10 DIAGNOSIS — O9270 Unspecified disorders of lactation: Secondary | ICD-10-CM

## 2016-12-10 DIAGNOSIS — Z01411 Encounter for gynecological examination (general) (routine) with abnormal findings: Secondary | ICD-10-CM

## 2016-12-10 DIAGNOSIS — Z202 Contact with and (suspected) exposure to infections with a predominantly sexual mode of transmission: Secondary | ICD-10-CM

## 2016-12-10 NOTE — Progress Notes (Signed)
Obstetrics and Gynecology Visit Annual Patient Evaluation  Appointment Date: 12/10/2016  OBGYN Clinic: [facility]  Primary Care Provider: Dorrene GermanEdwin A Cline  Referring Provider: Fleet Cline, Edwin, MD  Chief Complaint:  Chief Complaint  Patient presents with  . Gynecologic Exam    History of Present Illness: Margaret Cline is a 26 y.o. Caucasian [id number] (No LMP recorded. Patient has had an implant.), seen for the above chief complaint. Her past medical history is significant for BMI 33, h/o migraines, tobacco use, anxiety/depression, h/o STDs  Patient is doing well except that she notes b/l nipple discharge x 1, and she'd like her nexplanon taken out b/c she believes it could be causing her migraines, anxiety/depression to be worse and she is amenorrheic on it but does have 7 days qmonth that is painful and feels like menstrual cramps  Breast: b/l nipple discharge x 1 week. No prior h/o and no h/o trauma, and she denies stimulating her breasts/nipples. She states it's R>L and the discharge is clear/milky and denies any green/brown/bloody characteristics. She also denies any breast pain, lumps, bumps.  She also denies any new medications being started around this time  Nexplanon: This is her 2nd one and was placed about a year ago.   Mood: she sees a therapist in the community and is interested in taking out the nexplanon b/c of possibility of this contributing to it.   No fevers, chills, chest pain, SOB, nausea, vomiting, abdominal pain, dysuria, hematuria, vaginal itching, dyspareunia, change in BMs  Review of Systems:  as noted in the History of Present Illness.  Past Medical History:  Past Medical History:  Diagnosis Date  . Asthma   . Chlamydia 2008  . Chronic pain   . Constipation, chronic 11/13/11  . Depression    postpartum  . Gonorrhea   . Hx: UTI (urinary tract infection)    Frequently  . Irregular periods/menstrual cycles   . Migraines    Migraines   . Obese   . Trichomonas 2008    Past Surgical History:  Past Surgical History:  Procedure Laterality Date  . INDUCED ABORTION    . WISDOM TOOTH EXTRACTION      Past Obstetrical History:  OB History  Gravida Para Term Preterm AB Living  3 2 2  0 1 2  SAB TAB Ectopic Multiple Live Births  0 1 0 0 2    # Outcome Date GA Lbr Len/2nd Weight Sex Delivery Anes PTL Lv  3 Term 07/09/12 9269w5d 12:28 / 00:19 8 lb 1.6 oz (3.674 kg) F Vag-Spont EPI  LIV  2 Term 05/2007 7259w4d 19:00 7 lb 10.6 oz (3.476 kg) M Vag-Spont EPI N LIV     Birth Comments: Nuchal Cord x2, NICU.(VL)  1 TAB     U    DEC      Past Gynecological History: As per HPI. Pap history: none  Social History:  Social History   Social History  . Marital status: Single    Spouse name: N/A  . Number of children: N/A  . Years of education: N/A   Occupational History  . Not on file.   Social History Main Topics  . Smoking status: Current Every Day Smoker    Types: Cigarettes  . Smokeless tobacco: Never Used  . Alcohol use Yes     Comment: occasional  . Drug use: Yes    Types: Marijuana  . Sexual activity: Yes    Birth control/ protection: Implant     Comment:  Depo-Provera;pt states no unprotected i/c xs 14 days   Other Topics Concern  . Not on file   Social History Narrative  . No narrative on file    Family History:  Family History  Problem Relation Age of Onset  . Anesthesia problems Neg Hx   . Hypotension Neg Hx   . Malignant hyperthermia Neg Hx   . Pseudochol deficiency Neg Hx   . Hypertension Mother   . Heart disease Mother   . Diabetes Mother   . Stroke Mother   . Hyperlipidemia Mother   . Hypertension Sister   . Thrombophlebitis Sister     clotting issue  . Depression Sister   . Heart disease Maternal Grandfather   . Drug abuse Father   . Alcohol abuse Maternal Uncle   . Kidney disease Maternal Uncle     failure  . Rheum arthritis Sister     arthritis vs fibromyalgia  . Heart murmur  Sister   . Fibromyalgia Sister   . Depression Sister   . Hypertension Sister    She denies any female cancers  Medications [name] had no medications administered during this visit. Current Outpatient Prescriptions  Medication Sig Dispense Refill  . albuterol (PROAIR HFA) 108 (90 BASE) MCG/ACT inhaler Inhale 2 puffs into the lungs every 4 (four) hours as needed for wheezing or shortness of breath. For shortness of breath/wheezing    . albuterol (PROVENTIL) (2.5 MG/3ML) 0.083% nebulizer solution Take 3 mLs (2.5 mg total) by nebulization every 4 (four) hours as needed for wheezing or shortness of breath. 30 vial 0  . amitriptyline (ELAVIL) 25 MG tablet Take 25 mg by mouth at bedtime.  2  . butalbital-acetaminophen-caffeine (FIORICET, ESGIC) 50-325-40 MG tablet Take 1 tablet by mouth every 6 (six) hours as needed for headache. 10 tablet 0  . cetirizine (ZYRTEC) 10 MG tablet Take 10 mg by mouth daily.    . Cyanocobalamin (B-12 PO) Take 1 tablet by mouth daily.    Marland Kitchen etonogestrel (NEXPLANON) 68 MG IMPL implant Inject 1 each into the skin once.    . fluticasone (FLONASE) 50 MCG/ACT nasal spray Place 2 sprays into both nostrils daily. 16 g 0  . ibuprofen (ADVIL,MOTRIN) 800 MG tablet take 1 tablet by mouth every 8 hours if needed for MIGRAINE HEADACHE  0  . Multiple Vitamin (MULTIVITAMIN WITH MINERALS) TABS tablet Take 1 tablet by mouth daily.    . SUMAtriptan (IMITREX) 100 MG tablet Take 100 mg by mouth every 2 (two) hours as needed for migraine.  0   No current facility-administered medications for this visit.     Allergies Prednisone   Physical Exam:  BP (!) 129/92   Pulse 80   Wt 219 lb (99.3 kg)   BMI 34.30 kg/m  Body mass index is 34.3 kg/m. General appearance: Well nourished, well developed female in no acute distress.  Neck:  Supple, normal appearance, and no thyromegaly  Cardiovascular: normal s1 and s2.  No murmurs, rubs or gallops. Respiratory:  Clear to auscultation  bilateral. Normal respiratory effort Abdomen: positive bowel sounds and no masses, hernias; diffusely non tender to palpation, non distended Breasts: with nipple expression on the right, able to get some white, milky discharge coming from multiple ducts/openings. Minimal on the left too. b/l breasts don't have any lumps, masses or tenderness and no LNs palpated. Inspection normal. Neuro/Psych:  Normal mood and affect.  Skin:  Warm and dry.  Neuro: CN 2-12 grossly intact. 5/5 strength diffusely. Normal  sensation diffusely. 2+ brachial and patellar. Normal gait. EOMI. PEERL.  Lymphatic:  No inguinal lymphadenopathy.   Pelvic exam: is not limited by body habitus EGBUS: within normal limits, Vagina: within normal limits and with no blood or discharge in the vault, Cervix: normal appearing cervix without tenderness, discharge or lesions. Uterus:  nonenlarged and non tender and Adnexa:  normal adnexa and no mass, fullness, tenderness Rectovaginal: deferred  Laboratory: UPT negative  Radiology: none  Assessment: pt stable  Plan:  1. Well woman exam with routine gynecological exam Normal exam except for breast; see below. Pap done and pt amenable to STD testing. Depression screening score note concerning. Flowsheet reviewed and no HTN in the past. Can continue to follow.  - HIV antibody (with reflex) - RPR - Hepatitis B Surface AntiGEN - Cytology - PAP  2. STD exposure - HIV antibody (with reflex) - RPR - Hepatitis B Surface AntiGEN - Cytology - PAP  3. Lactation disorder D/w her that likely benign but given HA, concern for PRL etiology. Will check labs and call pt back re: next steps. She does see have a neurologist that she sees and she had a head CT w/o contrast back in 09/2016 for worsening HAs and this was negative.  - Prolactin - TSH - Beta HCG, Quant  RTC PRN  Cornelia Copa MD Attending Center for [facility])

## 2016-12-11 LAB — RPR: RPR: NONREACTIVE

## 2016-12-11 LAB — HEPATITIS B SURFACE ANTIGEN: HEP B S AG: NEGATIVE

## 2016-12-11 LAB — TSH: TSH: 0.622 u[IU]/mL (ref 0.450–4.500)

## 2016-12-11 LAB — CYTOLOGY - PAP
Chlamydia: NEGATIVE
Diagnosis: NEGATIVE
Neisseria Gonorrhea: NEGATIVE
TRICH (WINDOWPATH): NEGATIVE

## 2016-12-11 LAB — PROLACTIN: PROLACTIN: 9.1 ng/mL (ref 4.8–23.3)

## 2016-12-11 LAB — BETA HCG QUANT (REF LAB): hCG Quant: <1 m[IU]/mL

## 2016-12-11 LAB — HIV ANTIBODY (ROUTINE TESTING W REFLEX): HIV Screen 4th Generation wRfx: NONREACTIVE

## 2016-12-13 ENCOUNTER — Telehealth: Payer: Self-pay | Admitting: General Practice

## 2016-12-13 NOTE — Telephone Encounter (Signed)
Patient called and left message requesting results from Monday. Called patient back and informed her of results. Told patient I am uncertain what her prolactin & TSH levels mean but I will send Dr Vergie LivingPickens a message and we will call her back when we hear from him. Patient verbalized understanding & had no questions

## 2016-12-24 ENCOUNTER — Encounter: Payer: Self-pay | Admitting: Obstetrics and Gynecology

## 2016-12-24 ENCOUNTER — Telehealth: Payer: Self-pay | Admitting: Obstetrics and Gynecology

## 2016-12-24 DIAGNOSIS — N643 Galactorrhea not associated with childbirth: Secondary | ICD-10-CM | POA: Insufficient documentation

## 2016-12-24 NOTE — Telephone Encounter (Signed)
GYN Telephone Note All labs negative. She would like her nexplanon removed. D/w her that recommend exp management x [id number] after nexplanon is out and then if still having b/l spontaneous galactorrhea, call for repeat appointment and evaluation. Pt amenable to plan. Task sent for nexplanon removal appt  Margaret Cline, Jr MD Attending Center [facility] 12/24/2016 Time: 1350

## 2016-12-25 ENCOUNTER — Encounter: Payer: Self-pay | Admitting: General Practice

## 2016-12-25 ENCOUNTER — Telehealth: Payer: Self-pay | Admitting: *Deleted

## 2016-12-25 NOTE — Telephone Encounter (Signed)
Spoke to pt about scheduling appt, when informing her of the location, [facility] [place].  [facility] for scheduling.

## 2016-12-25 NOTE — Telephone Encounter (Signed)
-----   Message from Gering Bingharlie Pickens, MD sent at 12/24/2016  1:56 PM EDT ----- Regarding: nexplanon removal appt Please set her up for this. Thanks!

## 2017-01-22 ENCOUNTER — Ambulatory Visit: Payer: Self-pay | Admitting: Obstetrics and Gynecology

## 2017-01-28 ENCOUNTER — Emergency Department (HOSPITAL_COMMUNITY): Payer: Medicaid Other

## 2017-01-28 ENCOUNTER — Emergency Department (HOSPITAL_COMMUNITY)
Admission: EM | Admit: 2017-01-28 | Discharge: 2017-01-28 | Disposition: A | Discharge status: Home / Self Care | Payer: Medicaid Other | Attending: Emergency Medicine | Admitting: Emergency Medicine

## 2017-01-28 ENCOUNTER — Encounter (HOSPITAL_COMMUNITY): Payer: Self-pay | Admitting: Emergency Medicine

## 2017-01-28 DIAGNOSIS — Z5321 Procedure and treatment not carried out due to patient leaving prior to being seen by health care provider: Secondary | ICD-10-CM | POA: Insufficient documentation

## 2017-01-28 DIAGNOSIS — R079 Chest pain, unspecified: Secondary | ICD-10-CM | POA: Insufficient documentation

## 2017-01-28 LAB — BASIC METABOLIC PANEL
ANION GAP: 7 (ref 5–15)
BUN: 10 mg/dL (ref 6–20)
CALCIUM: 8.8 mg/dL — AB (ref 8.9–10.3)
CHLORIDE: 107 mmol/L (ref 101–111)
CO2: 23 mmol/L (ref 22–32)
CREATININE: 0.69 mg/dL (ref 0.44–1.00)
GFR calc non Af Amer: >60 mL/min (ref >60)
Glucose, Bld: 82 mg/dL (ref 65–99)
Potassium: 4.1 mmol/L (ref 3.5–5.1)
SODIUM: 137 mmol/L (ref 135–145)

## 2017-01-28 LAB — CBC
HCT: 40.2 % (ref 36.0–46.0)
HEMOGLOBIN: 13.6 g/dL (ref 12.0–15.0)
MCH: 29.8 pg (ref 26.0–34.0)
MCHC: 33.8 g/dL (ref 30.0–36.0)
MCV: 88.2 fL (ref 78.0–100.0)
PLATELETS: 253 10*3/uL (ref 150–400)
RBC: 4.56 MIL/uL (ref 3.87–5.11)
RDW: 14.1 % (ref 11.5–15.5)
WBC: 13.7 10*3/uL — AB (ref 4.0–10.5)

## 2017-01-28 LAB — I-STAT TROPONIN, ED: TROPONIN I, POC: 0 ng/mL (ref 0.00–0.08)

## 2017-01-28 MED ORDER — ALBUTEROL SULFATE (2.5 MG/3ML) 0.083% IN NEBU
INHALATION_SOLUTION | RESPIRATORY_TRACT | Status: AC
  Administered 2017-01-28: 2.5 mg
  Filled 2017-01-28: qty 3

## 2017-01-28 NOTE — ED Notes (Addendum)
Pt complaining of chest pain and reassessed and vitals taken. Pt does have expiratory wheezing present in lower lung fields. Pt taken back to triage room and started on albuterol treatment.

## 2017-01-28 NOTE — ED Triage Notes (Signed)
Pt states since thursday she has had a constant pain in the center of her chest. Pt denies any sob. Pt is warm and dry. No acute distress noted.

## 2017-01-28 NOTE — ED Notes (Signed)
Pt did not answer for vitals recheck 

## 2017-02-13 ENCOUNTER — Ambulatory Visit (INDEPENDENT_AMBULATORY_CARE_PROVIDER_SITE_OTHER): Payer: Medicaid Other | Admitting: Obstetrics and Gynecology

## 2017-02-13 ENCOUNTER — Encounter: Payer: Self-pay | Admitting: Obstetrics and Gynecology

## 2017-02-13 VITALS — BP 125/83 | HR 98 | Wt 221.3 lb

## 2017-02-13 DIAGNOSIS — Z3046 Encounter for surveillance of implantable subdermal contraceptive: Secondary | ICD-10-CM

## 2017-02-13 NOTE — Procedures (Signed)
Nexplanon Removal Procedure Note Patient desires removal to see if it will help with HAs. Prior to the procedure being performed, the patient was asked to state their full name, date of birth, type of procedure being performed and the exact location of the operative site. This information was then checked against the documentation in the patient's chart. Prior to the procedure being performed, a "time out" was performed by the physician that confirmed the correct patient, procedure and site.  After informed consent was obtained, the patient's LUE was examined and the nexplanon easily palpable at the insertion site. The area was swabbed with alcohol and then a wheal was made, underneath the nexplanon, with 3mL of lidocaine with epinephrine. The area was then cleaned with betadine and sterile gloves put on. An 11 blade was used to make an incision at the old insertion site and the nexplanon was easily brought to the surface, the capsule scrapped off and the nexplanon removed with hemostats. A steri strip was placed, a 2x2 gauze and band aids placed over that.   The patient tolerated the procedure well.  Patient told about only other option for [facility] is paragard and likely will make periods a little heavier and more uncomfortable and definitive option is BTL.  She still endorses b/l nipple discharge almost daily. Pt told to let us know if still going on in a month, now that hormones are removed via nexplanon removal.   Cornelia Copa MD Attending Center for [facility])

## 2017-03-05 ENCOUNTER — Encounter (HOSPITAL_COMMUNITY): Payer: Self-pay

## 2017-03-05 ENCOUNTER — Inpatient Hospital Stay (HOSPITAL_COMMUNITY)
Admission: AD | Admit: 2017-03-05 | Discharge: 2017-03-05 | Disposition: A | Discharge status: Home / Self Care | Payer: Medicaid Other | Source: Ambulatory Visit | Attending: Family Medicine | Admitting: Family Medicine

## 2017-03-05 DIAGNOSIS — Z818 Family history of other mental and behavioral disorders: Secondary | ICD-10-CM | POA: Insufficient documentation

## 2017-03-05 DIAGNOSIS — F17210 Nicotine dependence, cigarettes, uncomplicated: Secondary | ICD-10-CM | POA: Diagnosis not present

## 2017-03-05 DIAGNOSIS — B9689 Other specified bacterial agents as the cause of diseases classified elsewhere: Secondary | ICD-10-CM | POA: Diagnosis not present

## 2017-03-05 DIAGNOSIS — Z8269 Family history of other diseases of the musculoskeletal system and connective tissue: Secondary | ICD-10-CM | POA: Diagnosis not present

## 2017-03-05 DIAGNOSIS — G8929 Other chronic pain: Secondary | ICD-10-CM | POA: Insufficient documentation

## 2017-03-05 DIAGNOSIS — E669 Obesity, unspecified: Secondary | ICD-10-CM | POA: Insufficient documentation

## 2017-03-05 DIAGNOSIS — Z8249 Family history of ischemic heart disease and other diseases of the circulatory system: Secondary | ICD-10-CM | POA: Diagnosis not present

## 2017-03-05 DIAGNOSIS — J45909 Unspecified asthma, uncomplicated: Secondary | ICD-10-CM | POA: Insufficient documentation

## 2017-03-05 DIAGNOSIS — Z6834 Body mass index (BMI) 34.0-34.9, adult: Secondary | ICD-10-CM | POA: Insufficient documentation

## 2017-03-05 DIAGNOSIS — F329 Major depressive disorder, single episode, unspecified: Secondary | ICD-10-CM | POA: Insufficient documentation

## 2017-03-05 DIAGNOSIS — Z8619 Personal history of other infectious and parasitic diseases: Secondary | ICD-10-CM | POA: Insufficient documentation

## 2017-03-05 DIAGNOSIS — N760 Acute vaginitis: Secondary | ICD-10-CM | POA: Diagnosis not present

## 2017-03-05 DIAGNOSIS — Z888 Allergy status to other drugs, medicaments and biological substances status: Secondary | ICD-10-CM | POA: Diagnosis not present

## 2017-03-05 DIAGNOSIS — R1030 Lower abdominal pain, unspecified: Secondary | ICD-10-CM | POA: Diagnosis not present

## 2017-03-05 DIAGNOSIS — Z823 Family history of stroke: Secondary | ICD-10-CM | POA: Insufficient documentation

## 2017-03-05 DIAGNOSIS — Z79899 Other long term (current) drug therapy: Secondary | ICD-10-CM | POA: Diagnosis not present

## 2017-03-05 DIAGNOSIS — Z833 Family history of diabetes mellitus: Secondary | ICD-10-CM | POA: Insufficient documentation

## 2017-03-05 DIAGNOSIS — Z7951 Long term (current) use of inhaled steroids: Secondary | ICD-10-CM | POA: Diagnosis not present

## 2017-03-05 LAB — URINALYSIS, ROUTINE W REFLEX MICROSCOPIC
Bilirubin Urine: NEGATIVE
Glucose, UA: NEGATIVE mg/dL
HGB URINE DIPSTICK: NEGATIVE
KETONES UR: NEGATIVE mg/dL
Leukocytes, UA: NEGATIVE
Nitrite: NEGATIVE
Protein, ur: NEGATIVE mg/dL
Specific Gravity, Urine: 1.011 (ref 1.005–1.030)
pH: 6 (ref 5.0–8.0)

## 2017-03-05 LAB — CBC
HCT: 39.4 % (ref 36.0–46.0)
Hemoglobin: 13.3 g/dL (ref 12.0–15.0)
MCH: 30.2 pg (ref 26.0–34.0)
MCHC: 33.8 g/dL (ref 30.0–36.0)
MCV: 89.5 fL (ref 78.0–100.0)
PLATELETS: 247 10*3/uL (ref 150–400)
RBC: 4.4 MIL/uL (ref 3.87–5.11)
RDW: 14.8 % (ref 11.5–15.5)
WBC: 9.5 10*3/uL (ref 4.0–10.5)

## 2017-03-05 LAB — WET PREP, GENITAL
Sperm: NONE SEEN
TRICH WET PREP: NONE SEEN
YEAST WET PREP: NONE SEEN

## 2017-03-05 LAB — POCT PREGNANCY, URINE: PREG TEST UR: NEGATIVE

## 2017-03-05 LAB — HCG, QUANTITATIVE, PREGNANCY: hCG, Beta Chain, Quant, S: <1 m[IU]/mL (ref <5)

## 2017-03-05 MED ORDER — METRONIDAZOLE 500 MG PO TABS: 500.0000 mg | ORAL_TABLET | Freq: Three times a day (TID) | ORAL | 0 refills | Status: AC

## 2017-03-05 MED ORDER — ACETAMINOPHEN 500 MG PO TABS
1000.0000 mg | ORAL_TABLET | Freq: Four times a day (QID) | ORAL | Status: DC | PRN
  Administered 2017-03-05: 1000 mg via ORAL
  Filled 2017-03-05: qty 2

## 2017-03-05 NOTE — MAU Note (Signed)
Upper abd cramps and felt nausea x 2 weeks.  Took pregnancy test on Saturday and it was negative and then took a second test and it was positive on Sunday.  Wants boil on right upper thigh checked and it very painful x 2 days. No bleeding.

## 2017-03-05 NOTE — MAU Provider Note (Signed)
History     CSN: [id number]  Arrival date and time: 03/05/17 1943   First Provider Initiated Contact with Patient 03/05/17 2028      Chief Complaint  Patient presents with  . Abdominal Pain   26 y.o. female here with "pregnancy symptoms" She reports nausea most days that is worse in the am. She also reports lower abdominal cramping. Rates pain 7/10. Has not taken anything for it. Both sx started about 2 weeks ago. She took a pregnancy test 2 days ago and had one positive and one negative. She recently had a Nexplanon removed 3 weeks ago d/t galactorrhea. She is sexually active an not using any form of contraception. She denies VB or vaginal discharge. No new partners. She's also concerned about a boil on her right thigh that is painful. She first noticed it 2 days ago and she is soaking once daily in warm water to treat.   Past Medical History:  Diagnosis Date  . Asthma   . Chlamydia 2008  . Chronic pain   . Constipation, chronic 11/13/11  . Depression    postpartum  . Gonorrhea   . History of chlamydia 03/07/2012  . History of trichomoniasis 03/07/2012  . Hx: UTI (urinary tract infection)    Frequently  . Irregular periods/menstrual cycles   . Migraines    Migraines  . Obese   . Trichomonas 2008  . Vaginal delivery--shoulder dystocia 07/10/2012    Past Surgical History:  Procedure Laterality Date  . INDUCED ABORTION    . WISDOM TOOTH EXTRACTION      Family History  Problem Relation Age of Onset  . Hypertension Mother   . Heart disease Mother   . Diabetes Mother   . Stroke Mother   . Hyperlipidemia Mother   . Hypertension Sister   . Thrombophlebitis Sister        clotting issue  . Depression Sister   . Heart disease Maternal Grandfather   . Drug abuse Father   . Alcohol abuse Maternal Uncle   . Kidney disease Maternal Uncle        failure  . Rheum arthritis Sister        arthritis vs fibromyalgia  . Heart murmur Sister   . Fibromyalgia Sister   . Depression  Sister   . Hypertension Sister   . Anesthesia problems Neg Hx   . Hypotension Neg Hx   . Malignant hyperthermia Neg Hx   . Pseudochol deficiency Neg Hx     Social History  Substance Use Topics  . Smoking status: Current Every Day Smoker    Packs/day: 0.25    Types: Cigarettes  . Smokeless tobacco: Never Used     Comment: Black and Mild  . Alcohol use Yes     Comment: occasional    Allergies:  Allergies  Allergen Reactions  . Prednisone Nausea And Vomiting    Prescriptions Prior to Admission  Medication Sig Dispense Refill Last Dose  . albuterol (PROAIR HFA) 108 (90 BASE) MCG/ACT inhaler Inhale 2 puffs into the lungs every 4 (four) hours as needed for wheezing or shortness of breath. For shortness of breath/wheezing   Taking  . albuterol (PROVENTIL) (2.5 MG/3ML) 0.083% nebulizer solution Take 3 mLs (2.5 mg total) by nebulization every 4 (four) hours as needed for wheezing or shortness of breath. 30 vial 0 Taking  . amitriptyline (ELAVIL) 25 MG tablet Take 25 mg by mouth at bedtime.  2 Taking  . butalbital-acetaminophen-caffeine (FIORICET, ESGIC) 50-325-40 MG tablet  Take 1 tablet by mouth every 6 (six) hours as needed for headache. 10 tablet 0 Taking  . cetirizine (ZYRTEC) 10 MG tablet Take 10 mg by mouth daily.   Taking  . Cyanocobalamin (B-12 PO) Take 1 tablet by mouth daily.   Taking  . etonogestrel (NEXPLANON) 68 MG IMPL implant Inject 1 each into the skin once.   Taking  . fluticasone (FLONASE) 50 MCG/ACT nasal spray Place 2 sprays into both nostrils daily. 16 g 0 Taking  . ibuprofen (ADVIL,MOTRIN) 800 MG tablet take 1 tablet by mouth every 8 hours if needed for MIGRAINE HEADACHE  0 Taking  . Multiple Vitamin (MULTIVITAMIN WITH MINERALS) TABS tablet Take 1 tablet by mouth daily.   Taking  . SUMAtriptan (IMITREX) 100 MG tablet Take 100 mg by mouth every 2 (two) hours as needed for migraine.  0 Taking    Review of Systems  Constitutional: Negative for fever.   Gastrointestinal: Positive for abdominal pain and nausea. Negative for diarrhea and vomiting.  Genitourinary: Positive for frequency. Negative for dysuria, urgency and vaginal discharge.   Physical Exam   Blood pressure 132/89, pulse 95, temperature 98.5 F (36.9 C), temperature source Oral, resp. rate 18, height 5\' 8"  (1.727 m), weight 101.9 kg (224 lb 12 oz), SpO2 100 %.  Physical Exam  Nursing note and vitals reviewed. Constitutional: She appears well-developed and well-nourished. No distress.  HENT:  Head: Normocephalic and atraumatic.  Neck: Normal range of motion.  Respiratory: Effort normal. No respiratory distress.  GI: Soft. She exhibits no distension and no mass. There is no tenderness. There is no rebound and no guarding.  Skin: Skin is warm and dry.  1 cm raised, mildly erythematous lesion surrounding a hair follicle on right inner thigh, no edema or drainage  Psychiatric: She has a normal mood and affect.   Results for orders placed or performed during the hospital encounter of 03/05/17 (from the past 24 hour(s))  Urinalysis, Routine w reflex microscopic     Status: None   Collection Time: 03/05/17  7:59 PM  Result Value Ref Range   Color, Urine YELLOW YELLOW   APPearance CLEAR CLEAR   Specific Gravity, Urine 1.011 1.005 - 1.030   pH 6.0 5.0 - 8.0   Glucose, UA NEGATIVE NEGATIVE mg/dL   Hgb urine dipstick NEGATIVE NEGATIVE   Bilirubin Urine NEGATIVE NEGATIVE   Ketones, ur NEGATIVE NEGATIVE mg/dL   Protein, ur NEGATIVE NEGATIVE mg/dL   Nitrite NEGATIVE NEGATIVE   Leukocytes, UA NEGATIVE NEGATIVE  Pregnancy, urine POC     Status: None   Collection Time: 03/05/17  8:05 PM  Result Value Ref Range   Preg Test, Ur NEGATIVE NEGATIVE  Wet prep, genital     Status: Abnormal   Collection Time: 03/05/17  8:38 PM  Result Value Ref Range   Yeast Wet Prep HPF POC NONE SEEN NONE SEEN   Trich, Wet Prep NONE SEEN NONE SEEN   Clue Cells Wet Prep HPF POC PRESENT (A) NONE SEEN    WBC, Wet Prep HPF POC MODERATE (A) NONE SEEN   Sperm NONE SEEN   CBC     Status: None   Collection Time: 03/05/17  9:33 PM  Result Value Ref Range   WBC 9.5 4.0 - 10.5 K/uL   RBC 4.40 3.87 - 5.11 MIL/uL   Hemoglobin 13.3 12.0 - 15.0 g/dL   HCT 16.1 09.6 - 04.5 %   MCV 89.5 78.0 - 100.0 fL   MCH 30.2 26.0 -  34.0 pg   MCHC 33.8 30.0 - 36.0 g/dL   RDW 16.114.8 09.611.5 - 04.515.5 %   Platelets 247 150 - 400 K/uL   MAU Course  Procedures Tylenol 1 gm po  MDM Labs ordered and reviewed.  Transfer of care given to Karn CassisKooistra, CNM Bhambri, Melanie, [place]  03/05/2017 10:44 PM   BHCG less than 1; patient has been ambulating in the MAU. She says that her pain is a 7 however she has been observed ambulating without pain or difficulty in the MAU.  Assessment and Plan   1. Lower abdominal pain   2. Bacterial vaginosis      2. Patient BHCG less than 1; patient is stable for discharge with instructions to follow up with her PCP for abdominal pain or ob-gyn for birth control.  3. RX for flagyl given; all questions answered.   Luna KitchensKathryn Angelo Caroll CNM

## 2017-03-05 NOTE — Discharge Instructions (Signed)
Contraception Choices Contraception (birth control) is the use of any methods or devices to prevent pregnancy. Below are some methods to help avoid pregnancy. Hormonal methods  Contraceptive implant. This is a thin, plastic tube containing progesterone hormone. It does not contain estrogen hormone. Your health care provider inserts the tube in the inner part of the upper arm. The tube can remain in place for up to 3 years. After 3 years, the implant must be removed. The implant prevents the ovaries from releasing an egg (ovulation), thickens the cervical mucus to prevent sperm from entering the uterus, and thins the lining of the inside of the uterus.  Progesterone-only injections. These injections are given every 3 months by your health care provider to prevent pregnancy. This synthetic progesterone hormone stops the ovaries from releasing eggs. It also thickens cervical mucus and changes the uterine lining. This makes it harder for sperm to survive in the uterus.  Birth control pills. These pills contain estrogen and progesterone hormone. They work by preventing the ovaries from releasing eggs (ovulation). They also cause the cervical mucus to thicken, preventing the sperm from entering the uterus. Birth control pills are prescribed by a health care provider.Birth control pills can also be used to treat heavy periods.  Minipill. This type of birth control pill contains only the progesterone hormone. They are taken every day of each month and must be prescribed by your health care provider.  Birth control patch. The patch contains hormones similar to those in birth control pills. It must be changed once a week and is prescribed by a health care provider.  Vaginal ring. The ring contains hormones similar to those in birth control pills. It is left in the vagina for 3 weeks, removed for 1 week, and then a new one is put back in place. The patient must be comfortable inserting and removing the ring from  the vagina.A health care provider's prescription is necessary.  Emergency contraception. Emergency contraceptives prevent pregnancy after unprotected sexual intercourse. This pill can be taken right after sex or up to 5 days after unprotected sex. It is most effective the sooner you take the pills after having sexual intercourse. Most emergency contraceptive pills are available without a prescription. Check with your pharmacist. Do not use emergency contraception as your only form of birth control. Barrier methods  Female condom. This is a thin sheath (latex or rubber) that is worn over the penis during sexual intercourse. It can be used with spermicide to increase effectiveness.  Female condom. This is a soft, loose-fitting sheath that is put into the vagina before sexual intercourse.  Diaphragm. This is a soft, latex, dome-shaped barrier that must be fitted by a health care provider. It is inserted into the vagina, along with a spermicidal jelly. It is inserted before intercourse. The diaphragm should be left in the vagina for 6 to 8 hours after intercourse.  Cervical cap. This is a round, soft, latex or plastic cup that fits over the cervix and must be fitted by a health care provider. The cap can be left in place for up to 48 hours after intercourse.  Sponge. This is a soft, circular piece of polyurethane foam. The sponge has spermicide in it. It is inserted into the vagina after wetting it and before sexual intercourse.  Spermicides. These are chemicals that kill or block sperm from entering the cervix and uterus. They come in the form of creams, jellies, suppositories, foam, or tablets. They do not require a prescription. They   are inserted into the vagina with an applicator before having sexual intercourse. The process must be repeated every time you have sexual intercourse. Intrauterine contraception  Intrauterine device (IUD). This is a T-shaped device that is put in a woman's uterus during  a menstrual period to prevent pregnancy. There are 2 types: ? Copper IUD. This type of IUD is wrapped in copper wire and is placed inside the uterus. Copper makes the uterus and fallopian tubes produce a fluid that kills sperm. It can stay in place for 10 years. ? Hormone IUD. This type of IUD contains the hormone progestin (synthetic progesterone). The hormone thickens the cervical mucus and prevents sperm from entering the uterus, and it also thins the uterine lining to prevent implantation of a fertilized egg. The hormone can weaken or kill the sperm that get into the uterus. It can stay in place for 3-5 years, depending on which type of IUD is used. Permanent methods of contraception  Female tubal ligation. This is when the woman's fallopian tubes are surgically sealed, tied, or blocked to prevent the egg from traveling to the uterus.  Hysteroscopic sterilization. This involves placing a small coil or insert into each fallopian tube. Your doctor uses a technique called hysteroscopy to do the procedure. The device causes scar tissue to form. This results in permanent blockage of the fallopian tubes, so the sperm cannot fertilize the egg. It takes about 3 months after the procedure for the tubes to become blocked. You must use another form of birth control for these 3 months.  Female sterilization. This is when the female has the tubes that carry sperm tied off (vasectomy).This blocks sperm from entering the vagina during sexual intercourse. After the procedure, the man can still ejaculate fluid (semen). Natural planning methods  Natural family planning. This is not having sexual intercourse or using a barrier method (condom, diaphragm, cervical cap) on days the woman could become pregnant.  Calendar method. This is keeping track of the length of each menstrual cycle and identifying when you are fertile.  Ovulation method. This is avoiding sexual intercourse during ovulation.  Symptothermal method.  This is avoiding sexual intercourse during ovulation, using a thermometer and ovulation symptoms.  Post-ovulation method. This is timing sexual intercourse after you have ovulated. Regardless of which type or method of contraception you choose, it is important that you use condoms to protect against the transmission of sexually transmitted infections (STIs). Talk with your health care provider about which form of contraception is most appropriate for you. This information is not intended to replace advice given to you by your health care provider. Make sure you discuss any questions you have with your health care provider. Document Released: 10/01/2005 Document Revised: 03/08/2016 Document Reviewed: 03/26/2013 Elsevier Interactive Patient Education  2017 Elsevier Inc.  

## 2017-03-06 LAB — GC/CHLAMYDIA PROBE AMP (~~LOC~~) NOT AT ARMC
Chlamydia: NEGATIVE
Neisseria Gonorrhea: NEGATIVE

## 2017-05-20 ENCOUNTER — Other Ambulatory Visit (HOSPITAL_COMMUNITY)
Admission: RE | Admit: 2017-05-20 | Discharge: 2017-05-20 | Disposition: A | Discharge status: Home / Self Care | Payer: Medicaid Other | Source: Ambulatory Visit | Attending: Obstetrics and Gynecology | Admitting: Obstetrics and Gynecology

## 2017-05-20 ENCOUNTER — Encounter: Payer: Self-pay | Admitting: Obstetrics and Gynecology

## 2017-05-20 ENCOUNTER — Ambulatory Visit (INDEPENDENT_AMBULATORY_CARE_PROVIDER_SITE_OTHER): Payer: Medicaid Other | Admitting: Obstetrics and Gynecology

## 2017-05-20 VITALS — BP 139/82 | HR 91 | Wt 223.6 lb

## 2017-05-20 DIAGNOSIS — R1030 Lower abdominal pain, unspecified: Secondary | ICD-10-CM | POA: Insufficient documentation

## 2017-05-20 DIAGNOSIS — N6452 Nipple discharge: Secondary | ICD-10-CM

## 2017-05-20 LAB — POCT PREGNANCY, URINE: Preg Test, Ur: NEGATIVE

## 2017-05-20 NOTE — Progress Notes (Signed)
Obstetrics and Gynecology Return Patient Evaluation  Appointment Date: 05/20/2017  OB[facility] Clinic: [facility]  Primary Care Provider: Fleet Contras  Referring Provider: Fleet Contras, MD  Chief Complaint: persistent right nipple discharge, dysmenorrhea  History of Present Illness: Margaret Cline is a 26 y.o. African-American [id number] (Patient's last menstrual period was 05/09/2017 (exact date).), seen for the above chief complaint. Her past medical history is significant for migraines  Patient had Nexplanon removed in May 2018 to see if that would help with her migraines and galactorrhea.    Galactorrhea: pt states the nipple discharge is better but still notes it out of the right nipple sometimes. She states that her breast isn't painful and that the discharge looks milky or clear. Nothing from the left breast  Abdominal cramping and periods: she states her periods last for 5 days, are regular, no intermenstrual bleeding but that it was darker than in the past and she was concerned about this and she also has some occasional cramping outside of her periods. She is using condoms for [facility].    Review of Systems:  as noted in the History of Present Illness.  Past Medical History:  Past Medical History:  Diagnosis Date  . Asthma   . Chlamydia 2008  . Chronic pain   . Constipation, chronic 11/13/11  . Depression    postpartum  . Gonorrhea   . History of chlamydia 03/07/2012  . History of trichomoniasis 03/07/2012  . Hx: UTI (urinary tract infection)    Frequently  . Irregular periods/menstrual cycles   . Migraines    Migraines  . Obese   . Trichomonas 2008  . Vaginal delivery--shoulder dystocia 07/10/2012    Past Surgical History:  Past Surgical History:  Procedure Laterality Date  . INDUCED ABORTION    . WISDOM TOOTH EXTRACTION      Past Obstetrical History:  OB History  Gravida Para Term Preterm AB Living  3 2 2  0 1 2  SAB TAB  Ectopic Multiple Live Births  0 1 0 0 2    # Outcome Date GA Lbr Len/2nd Weight Sex Delivery Anes PTL Lv  3 Term 07/09/12 [redacted]w[redacted]d 12:28 / 00:19 8 lb 1.6 oz (3.674 kg) F Vag-Spont EPI  LIV  2 Term 05/2007 [redacted]w[redacted]d 19:00 7 lb 10.6 oz (3.476 kg) M Vag-Spont EPI N LIV     Birth Comments: Nuchal Cord x2, NICU.(VL)  1 TAB     U    DEC      Past Gynecological History: As per HPI. 11/2016: negative pap STI: 11/2016 negative HIV, rpr, HepB surface antigen  Social History:  Social History   Social History  . Marital status: Single    Spouse name: N/A  . Number of children: N/A  . Years of education: N/A   Occupational History  . Not on file.   Social History Main Topics  . Smoking status: Current Every Day Smoker    Packs/day: 0.25    Types: Cigarettes  . Smokeless tobacco: Never Used     Comment: Black and Mild  . Alcohol use Yes     Comment: occasional  . Drug use: Yes    Types: Marijuana  . Sexual activity: Yes     Comment: Nexplanon removed 4 weeks ago   Other Topics Concern  . Not on file   Social History Narrative  . No narrative on file    Family History:  Family History  Problem Relation Age of Onset  .  Hypertension Mother   . Heart disease Mother   . Diabetes Mother   . Stroke Mother   . Hyperlipidemia Mother   . Hypertension Sister   . Thrombophlebitis Sister        clotting issue  . Depression Sister   . Heart disease Maternal Grandfather   . Drug abuse Father   . Alcohol abuse Maternal Uncle   . Kidney disease Maternal Uncle        failure  . Rheum arthritis Sister        arthritis vs fibromyalgia  . Heart murmur Sister   . Fibromyalgia Sister   . Depression Sister   . Hypertension Sister   . Anesthesia problems Neg Hx   . Hypotension Neg Hx   . Malignant hyperthermia Neg Hx   . Pseudochol deficiency Neg Hx     Medications Ms. Ane PaymentHooker had no medications administered during this visit. Current Outpatient Prescriptions  Medication Sig Dispense  Refill  . albuterol (PROAIR HFA) 108 (90 BASE) MCG/ACT inhaler Inhale 2 puffs into the lungs every 4 (four) hours as needed for wheezing or shortness of breath. For shortness of breath/wheezing    . albuterol (PROVENTIL) (2.5 MG/3ML) 0.083% nebulizer solution Take 3 mLs (2.5 mg total) by nebulization every 4 (four) hours as needed for wheezing or shortness of breath. 30 vial 0  . cetirizine (ZYRTEC) 10 MG tablet Take 10 mg by mouth daily.    . Cyanocobalamin (B-12 PO) Take 1 tablet by mouth daily.    . fluticasone (FLONASE) 50 MCG/ACT nasal spray Place 2 sprays into both nostrils daily. 16 g 0  . ibuprofen (ADVIL,MOTRIN) 800 MG tablet take 1 tablet by mouth every 8 hours if needed for MIGRAINE HEADACHE  0  . Multiple Vitamin (MULTIVITAMIN WITH MINERALS) TABS tablet Take 1 tablet by mouth daily.    . nortriptyline (PAMELOR) 50 MG capsule Take 50 mg by mouth at bedtime.  3  . SUMAtriptan (IMITREX) 100 MG tablet Take 100 mg by mouth every 2 (two) hours as needed for migraine.  0   No current facility-administered medications for this visit.     Allergies Prednisone   Physical Exam:  BP 139/82   Pulse 91   Wt 223 lb 9.6 oz (101.4 kg)   LMP 05/09/2017 (Exact Date)   BMI 34.00 kg/m  Body mass index is 34 kg/m. General appearance: Well nourished, well developed female in no acute distress.  Neck:  Supple, normal appearance, and no thyromegaly  Cardiovascular: normal s1 and s2.  No murmurs, rubs or gallops. Respiratory:  Clear to auscultation bilateral. Normal respiratory effort Abdomen: positive bowel sounds and no masses, hernias; diffusely non tender to palpation, non distended Breasts: Right-nttp, no masses or lumps felt with normal appearing skin and nipple. Able to express small amout of clear fluid from two ducts on the nipple, negative axilla. No piercings Left-same except not able to express anything from nipple  Neuro/Psych:  Normal mood and affect.  Skin:  Warm and dry.   Lymphatic:  No inguinal lymphadenopathy.   Pelvic exam: is not limited by body habitus EGBUS: within normal limits, Vagina: within normal limits and with no blood or discharge in the vault, Cervix: normal appearing cervix without tenderness, discharge or lesions. Uterus:  8-10 week sized, smooth, nonenlarged and non tender and Adnexa:  normal adnexa and no mass, fullness, tenderness Rectovaginal: deferred  Laboratory: UPT negative  Radiology: none   Assessment: pt stable  Plan:  1. Lower abdominal  pain D/w her that it sounds as if her periods are normal and just worse in comparision to what they were prior to removal of the nexplanon. I told her that her only real option, if she desires something to help with her period, that a LNG IUD is the best option. Pt to consider this option. Pt told that if s/s persist and she'd like an ultrasound to give it until mid to late September to give it at least a full three months since her Nexplanon was removed - Cervicovaginal ancillary only - Beta HCG, Quant  2. Nipple discharge in female Will repeat labs and order a right breast ultrasound - Beta HCG, Quant - TSH - Prolactin - US BREAST COMPLETE UNI RIGHT INC AXILLA; Future  RTC PRN  Cornelia Copa MD Attending [facility])

## 2017-05-20 NOTE — Addendum Note (Signed)
Addended by: Cheree DittoGRAHAM, DEMETRICE A on: 05/20/2017 08:50 AM   Modules accepted: Orders

## 2017-05-21 LAB — CERVICOVAGINAL ANCILLARY ONLY
CHLAMYDIA, DNA PROBE: NEGATIVE
NEISSERIA GONORRHEA: NEGATIVE
TRICH (WINDOWPATH): NEGATIVE

## 2017-05-21 LAB — PROLACTIN: PROLACTIN: 16 ng/mL (ref 4.8–23.3)

## 2017-05-21 LAB — TSH: TSH: 1.31 u[IU]/mL (ref 0.450–4.500)

## 2017-05-21 LAB — BETA HCG QUANT (REF LAB): hCG Quant: <1 m[IU]/mL

## 2017-05-29 ENCOUNTER — Encounter (HOSPITAL_COMMUNITY): Payer: Self-pay

## 2017-05-29 ENCOUNTER — Emergency Department (HOSPITAL_COMMUNITY)
Admission: EM | Admit: 2017-05-29 | Discharge: 2017-05-29 | Disposition: A | Discharge status: Home / Self Care | Payer: Medicaid Other | Attending: Emergency Medicine | Admitting: Emergency Medicine

## 2017-05-29 DIAGNOSIS — R519 Headache, unspecified: Secondary | ICD-10-CM

## 2017-05-29 DIAGNOSIS — J069 Acute upper respiratory infection, unspecified: Secondary | ICD-10-CM | POA: Diagnosis not present

## 2017-05-29 DIAGNOSIS — B9789 Other viral agents as the cause of diseases classified elsewhere: Secondary | ICD-10-CM

## 2017-05-29 DIAGNOSIS — F17210 Nicotine dependence, cigarettes, uncomplicated: Secondary | ICD-10-CM | POA: Diagnosis not present

## 2017-05-29 DIAGNOSIS — J45909 Unspecified asthma, uncomplicated: Secondary | ICD-10-CM | POA: Insufficient documentation

## 2017-05-29 DIAGNOSIS — Z79899 Other long term (current) drug therapy: Secondary | ICD-10-CM | POA: Diagnosis not present

## 2017-05-29 DIAGNOSIS — R05 Cough: Secondary | ICD-10-CM | POA: Diagnosis present

## 2017-05-29 DIAGNOSIS — B349 Viral infection, unspecified: Secondary | ICD-10-CM | POA: Insufficient documentation

## 2017-05-29 DIAGNOSIS — R51 Headache: Secondary | ICD-10-CM

## 2017-05-29 MED ORDER — BENZONATATE 100 MG PO CAPS: 100.0000 mg | ORAL_CAPSULE | Freq: Three times a day (TID) | ORAL | 0 refills | Status: DC

## 2017-05-29 MED ORDER — NAPROXEN 500 MG PO TABS: 500.0000 mg | ORAL_TABLET | Freq: Two times a day (BID) | ORAL | 0 refills | Status: DC

## 2017-05-29 MED ORDER — LORATADINE 10 MG PO TABS: 10.0000 mg | ORAL_TABLET | Freq: Every day | ORAL | 0 refills | Status: AC

## 2017-05-29 MED ORDER — TRIAMCINOLONE ACETONIDE 55 MCG/ACT NA AERO: 2.0000 | INHALATION_SPRAY | Freq: Every day | NASAL | 12 refills | Status: DC

## 2017-05-29 MED ORDER — FEXOFENADINE HCL 60 MG PO TABS: 60.0000 mg | ORAL_TABLET | Freq: Two times a day (BID) | ORAL | 0 refills | Status: DC

## 2017-05-29 NOTE — ED Provider Notes (Signed)
[facility]-EMERGENCY DEPT Provider Note   CSN: [id number] Arrival date & time: 05/29/17  1145     History   Chief Complaint Chief Complaint  Patient presents with  . Cough  . URI    HPI Margaret Cline is a 26 y.o. female.  HPI   Margaret Cline is a 25yo female with a history of asthma, recurrent sinus infections and migraines who presents to the Emergency Department for evaluation of cough, sinus drainiage and headache. She states that cough began about two days ago and is productive with white and yellow phlegm. It is not associated with wheezing or shortness of breath, but she has been using her albuterol nebulizer more frequently (3x last night) for relief. She also has been using Flonase for nasal secretions which she doesn't think has been helping. Headache is "pressure like" in her face. Of note, her daughter is sick at home with a cough. She has been eating and pushing fluids. She denies fever, ear pain, throat pain, chest pain, nausea, vomiting.     Past Medical History:  Diagnosis Date  . Asthma   . Chlamydia 2008  . Chronic pain   . Constipation, chronic 11/13/11  . Depression    postpartum  . Gonorrhea   . History of chlamydia 03/07/2012  . History of trichomoniasis 03/07/2012  . Hx: UTI (urinary tract infection)    Frequently  . Irregular periods/menstrual cycles   . Migraines    Migraines  . Obese   . Trichomonas 2008  . Vaginal delivery--shoulder dystocia 07/10/2012    Patient Active Problem List   Diagnosis Date Noted  . 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 Chronic migraine without aura with status migrainosus, not intractable 02/23/2015  . Persistent headaches 02/23/2015  . Cigarette nicotine dependence without complication 02/23/2015  . BMI 33.0-33.9,adult 02/23/2015  . Major depression, recurrent (HCC) 08/23/2013  . Suicide attempt by multiple drug overdose (HCC) 08/23/2013  . Asthma 03/20/2012  . Obesity 03/07/2012  . Migraine 03/07/2012    Past Surgical History:   Procedure Laterality Date  . INDUCED ABORTION    . WISDOM TOOTH EXTRACTION      OB History    Gravida Para Term Preterm AB Living   3 2 2  0 1 2   SAB TAB Ectopic Multiple Live Births   0 1 0 0 2       Home Medications    Prior to Admission medications   Medication Sig Start Date End Date Taking? Authorizing Provider  albuterol (PROAIR HFA) 108 (90 BASE) MCG/ACT inhaler Inhale 2 puffs into the lungs every 4 (four) hours as needed for wheezing or shortness of breath. For shortness of breath/wheezing    [provider]  albuterol (PROVENTIL) (2.5 MG/3ML) 0.083% nebulizer solution Take 3 mLs (2.5 mg total) by nebulization every 4 (four) hours as needed for wheezing or shortness of breath. 09/06/15   Street, Mercedes, PA-C  benzonatate (TESSALON) 100 MG capsule Take 1 capsule (100 mg total) by mouth every 8 (eight) hours. 05/29/17   Kellie Shropshire, PA-C  cetirizine (ZYRTEC) 10 MG tablet Take 10 mg by mouth daily.    [provider]  Cyanocobalamin (B-12 PO) Take 1 tablet by mouth daily.    [provider]  fluticasone (FLONASE) 50 MCG/ACT nasal spray Place 2 sprays into both nostrils daily. 09/06/15   Street, [place], PA-C  ibuprofen (ADVIL,MOTRIN) 800 MG tablet take 1 tablet by mouth every 8 hours if needed for MIGRAINE HEADACHE 11/18/15   [provider]  loratadine (CLARITIN) 10 MG tablet Take 1 tablet (10 mg total) by mouth daily. 05/29/17   Kellie ShropshireShrosbree, Bernice Mullin J, PA-C  Multiple Vitamin (MULTIVITAMIN WITH MINERALS) TABS tablet Take 1 tablet by mouth daily.    [provider]  naproxen (NAPROSYN) 500 MG tablet Take 1 tablet (500 mg total) by mouth 2 (two) times daily. 05/29/17   Kellie ShropshireShrosbree, Meliza Kage J, PA-C  nortriptyline (PAMELOR) 50 MG capsule Take 50 mg by mouth at bedtime. 01/15/17   [provider]  SUMAtriptan (IMITREX) 100 MG tablet Take 100 mg by mouth every 2 (two) hours as needed for migraine. 08/29/16   [provider]    triamcinolone (NASACORT) 55 MCG/ACT AERO nasal inhaler Place 2 sprays into the nose daily. 05/29/17   Kellie ShropshireShrosbree, Zaryah Seckel J, PA-C    Family History Family History  Problem Relation Age of Onset  . Hypertension Mother   . Heart disease Mother   . Diabetes Mother   . Stroke Mother   . Hyperlipidemia Mother   . Hypertension Sister   . Thrombophlebitis Sister        clotting issue  . Depression Sister   . Heart disease Maternal Grandfather   . Drug abuse Father   . Alcohol abuse Maternal Uncle   . Kidney disease Maternal Uncle        failure  . Rheum arthritis Sister        arthritis vs fibromyalgia  . Heart murmur Sister   . Fibromyalgia Sister   . Depression Sister   . Hypertension Sister   . Anesthesia problems Neg Hx   . Hypotension Neg Hx   . Malignant hyperthermia Neg Hx   . Pseudochol deficiency Neg Hx     Social History Social History  Substance Use Topics  . Smoking status: Current Every Day Smoker    Packs/day: 0.25    Types: Cigarettes  . Smokeless tobacco: Never Used     Comment: Black and Mild  . Alcohol use Yes     Comment: occasional     Allergies   Prednisone   Review of Systems Review of Systems  Constitutional: Negative for appetite change, chills and fever.  HENT: Positive for congestion, postnasal drip, rhinorrhea, sinus pain and sinus pressure. Negative for ear discharge, ear pain, hearing loss and sore throat.   Eyes: Negative for pain and redness.  Respiratory: Positive for cough and chest tightness. Negative for shortness of breath, wheezing and stridor.   Cardiovascular: Negative for chest pain and palpitations.  Gastrointestinal: Negative for abdominal pain, diarrhea, nausea and vomiting.  Genitourinary: Negative for dysuria.  Musculoskeletal: Negative for arthralgias and myalgias.  Neurological: Positive for headaches (pressure like). Negative for dizziness, light-headedness and numbness.     Physical Exam Updated Vital Signs BP  122/84 (BP Location: Left Arm)   Pulse 92   Temp 98.5 F (36.9 C) (Oral)   Resp 18   Ht 5' 7.5" (1.715 m)   Wt 100.2 kg (221 lb)   LMP 05/09/2017 (Exact Date)   SpO2 100%   BMI 34.10 kg/m   Physical Exam  Constitutional: She is oriented to person, place, and time. She appears well-developed and well-nourished. No distress.  HENT:  Head: Normocephalic and atraumatic.  Right Ear: External ear normal.  Left Ear: External ear normal.  Mouth/Throat: Uvula is midline and mucous membranes are normal.  Clear drainage along the posterior oropharynx  Eyes: Pupils are equal, round, and reactive to light. Conjunctivae are normal. Right eye exhibits  no discharge. Left eye exhibits no discharge.  Neck: Normal range of motion. Neck supple.  Cardiovascular: Normal rate, regular rhythm and normal heart sounds.  Exam reveals no gallop and no friction rub.   No murmur heard. Pulmonary/Chest: Effort normal. No respiratory distress. She has no wheezes. She has no rales. She exhibits no tenderness.  Occasional cough with deep expiration   Lymphadenopathy:    She has no cervical adenopathy.  Neurological: She is alert and oriented to person, place, and time. Coordination normal.  Skin: Skin is warm and dry. She is not diaphoretic.  Psychiatric: She has a normal mood and affect. Her behavior is normal.     ED Treatments / Results  Labs (all labs ordered are listed, but only abnormal results are displayed) Labs Reviewed - No data to display  EKG  EKG Interpretation None       Radiology No results found.  Procedures Procedures (including critical care time)  Medications Ordered in ED Medications - No data to display   Initial Impression / Assessment and Plan / ED Course  I have reviewed the triage vital signs and the nursing notes.  Pertinent labs & imaging results that were available during my care of the patient were reviewed by me and considered in my medical decision making (see  chart for details).  Patient is stable and in no acute distress. Her cough and congestion has lasted two days and it is likely viral upper respiratory infection in the setting of patient's underlying asthma. Secretions are nonpurulent on exam, and lungs are clear without wheezing, stridor, rales or shortness of breath. Suspect cough is coming from her post nasal drip. She may continue her albuterol nebulizer treatments for any wheezing, shortness of breath. She can take OTC naprosyn for h/a, tessalon perles for cough, nasacort and claritin for allergies. Discussed return precautions, patient voiced understanding.   Final Clinical Impressions(s) / ED Diagnoses   Final diagnoses:  Viral URI with cough  Sinus headache    New Prescriptions New Prescriptions   BENZONATATE (TESSALON) 100 MG CAPSULE    Take 1 capsule (100 mg total) by mouth every 8 (eight) hours.   LORATADINE (CLARITIN) 10 MG TABLET    Take 1 tablet (10 mg total) by mouth daily.   NAPROXEN (NAPROSYN) 500 MG TABLET    Take 1 tablet (500 mg total) by mouth 2 (two) times daily.   TRIAMCINOLONE (NASACORT) 55 MCG/ACT AERO NASAL INHALER    Place 2 sprays into the nose daily.     Kellie Shropshire, PA-C 05/29/17 1325    Arby Barrette, MD 06/06/17 [phone]

## 2017-05-29 NOTE — ED Triage Notes (Signed)
Pt endorses cough with drainage since last night. Pt has hx of sinus infections and this feels like the same. Pt tried to go to pcp but could not get an appointment. VSS.

## 2017-05-29 NOTE — Discharge Instructions (Signed)
Continue albuterol nebulizer treatments as needed for shortness of breath and any wheezing you may have. Please return if you have trouble breathing or cannot catch your breath. Use tylenol should you develop a fever. You have a prescription for nasacort and loratidine for drainage. You can use tessalon perles for cough and naprosyn for headache.

## 2017-05-31 ENCOUNTER — Ambulatory Visit
Admission: RE | Admit: 2017-05-31 | Discharge: 2017-05-31 | Disposition: A | Discharge status: Home / Self Care | Payer: Medicaid Other | Source: Ambulatory Visit | Attending: Obstetrics and Gynecology | Admitting: Obstetrics and Gynecology

## 2017-05-31 DIAGNOSIS — N6452 Nipple discharge: Secondary | ICD-10-CM

## 2017-06-26 ENCOUNTER — Telehealth: Payer: Self-pay | Admitting: Neurology

## 2017-06-26 NOTE — Telephone Encounter (Signed)
Pt had a script that was ready at the front from 2017. Pt never picked up. The script will be shredded at this time.

## 2017-07-23 ENCOUNTER — Ambulatory Visit: Payer: Medicaid Other | Admitting: Nurse Practitioner

## 2017-08-29 ENCOUNTER — Emergency Department (HOSPITAL_COMMUNITY)
Admission: EM | Admit: 2017-08-29 | Discharge: 2017-08-29 | Disposition: A | Discharge status: Home / Self Care | Payer: Self-pay | Attending: Emergency Medicine | Admitting: Emergency Medicine

## 2017-08-29 ENCOUNTER — Encounter (HOSPITAL_COMMUNITY): Payer: Self-pay | Admitting: *Deleted

## 2017-08-29 ENCOUNTER — Other Ambulatory Visit: Payer: Self-pay

## 2017-08-29 DIAGNOSIS — Z79899 Other long term (current) drug therapy: Secondary | ICD-10-CM | POA: Insufficient documentation

## 2017-08-29 DIAGNOSIS — J45909 Unspecified asthma, uncomplicated: Secondary | ICD-10-CM | POA: Insufficient documentation

## 2017-08-29 DIAGNOSIS — F17210 Nicotine dependence, cigarettes, uncomplicated: Secondary | ICD-10-CM | POA: Insufficient documentation

## 2017-08-29 DIAGNOSIS — R51 Headache: Secondary | ICD-10-CM | POA: Insufficient documentation

## 2017-08-29 DIAGNOSIS — R519 Headache, unspecified: Secondary | ICD-10-CM

## 2017-08-29 MED ORDER — METOCLOPRAMIDE HCL 5 MG/ML IJ SOLN
10.0000 mg | Freq: Once | INTRAMUSCULAR | Status: AC
  Administered 2017-08-29: 10 mg via INTRAVENOUS
  Filled 2017-08-29: qty 2

## 2017-08-29 MED ORDER — BUTALBITAL-APAP-CAFFEINE 50-325-40 MG PO TABS: 1.0000 | ORAL_TABLET | Freq: Four times a day (QID) | ORAL | 0 refills | Status: DC | PRN

## 2017-08-29 MED ORDER — DIPHENHYDRAMINE HCL 50 MG/ML IJ SOLN
25.0000 mg | Freq: Once | INTRAMUSCULAR | Status: AC
  Administered 2017-08-29: 25 mg via INTRAVENOUS
  Filled 2017-08-29: qty 1

## 2017-08-29 MED ORDER — SODIUM CHLORIDE 0.9 % IV BOLUS (SEPSIS)
1000.0000 mL | Freq: Once | INTRAVENOUS | Status: AC
  Administered 2017-08-29: 1000 mL via INTRAVENOUS

## 2017-08-29 MED ORDER — BUTALBITAL-APAP-CAFFEINE 50-325-40 MG PO TABS
1.0000 | ORAL_TABLET | Freq: Once | ORAL | Status: AC
  Administered 2017-08-29: 1 via ORAL
  Filled 2017-08-29: qty 1

## 2017-08-29 MED ORDER — KETOROLAC TROMETHAMINE 30 MG/ML IJ SOLN
30.0000 mg | Freq: Once | INTRAMUSCULAR | Status: AC
  Administered 2017-08-29: 30 mg via INTRAVENOUS
  Filled 2017-08-29: qty 1

## 2017-08-29 NOTE — ED Triage Notes (Signed)
Pt has been having a migraine for 2 weeks and is constant and right eye blurry (3 days intermittent).  No vomiting or nausea.

## 2017-08-29 NOTE — ED Provider Notes (Signed)
[facility] EMERGENCY DEPARTMENT Provider Note   CSN: [id number] Arrival date & time: 08/29/17  1221     History   Chief Complaint Chief Complaint  Patient presents with  . Migraine    HPI Margaret Cline is a 26 y.o. female.  HPI   26 year old female with history of migraine, and chronic pain presenting for evaluation of headache.  Patient reports persistent but waxing and waning headache ongoing for the past 2 weeks.  Headache is primarily to her right side of headache radiated towards her right ear, with occasional bouts of lightheadedness and dizziness, and occasional nausea with light and sound sensitivity.  Headache is currently moderate in severity.  She has tried taking over-the-counter medication as well as her headache medication without relief.  Headache is similar to prior except more prolonged in duration.  No report of fever, chills, diplopia, loss of vision, hearing changes, ringing in ears, runny nose, sneezing, coughing, sore throat, neck pain, focal numbness or weakness, confusion, or rash.  She does have a neurologist in the past for headache.  She denies any recent medication changes.  She denies any recent injury.  Her last menstrual period was 10/30.  Past Medical History:  Diagnosis Date  . Asthma   . Chlamydia 2008  . Chronic pain   . Constipation, chronic 11/13/11  . Depression    postpartum  . Gonorrhea   . History of chlamydia 03/07/2012  . History of trichomoniasis 03/07/2012  . Hx: UTI (urinary tract infection)    Frequently  . Irregular periods/menstrual cycles   . Migraines    Migraines  . Obese   . Trichomonas 2008  . Vaginal delivery--shoulder dystocia 07/10/2012    Patient Active Problem List   Diagnosis Date Noted  . 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 Chronic migraine without aura with status migrainosus, not intractable 02/23/2015  . Persistent headaches 02/23/2015  . Cigarette nicotine dependence without complication 02/23/2015   . BMI 33.0-33.9,adult 02/23/2015  . Major depression, recurrent (HCC) 08/23/2013  . Suicide attempt by multiple drug overdose (HCC) 08/23/2013  . Asthma 03/20/2012  . Obesity 03/07/2012  . Migraine 03/07/2012    Past Surgical History:  Procedure Laterality Date  . INDUCED ABORTION    . WISDOM TOOTH EXTRACTION      OB History    Gravida Para Term Preterm AB Living   3 2 2  0 1 2   SAB TAB Ectopic Multiple Live Births   0 1 0 0 2       Home Medications    Prior to Admission medications   Medication Sig Start Date End Date Taking? Authorizing Provider  albuterol (PROAIR HFA) 108 (90 BASE) MCG/ACT inhaler Inhale 2 puffs into the lungs every 4 (four) hours as needed for wheezing or shortness of breath. For shortness of breath/wheezing    [provider]  albuterol (PROVENTIL) (2.5 MG/3ML) 0.083% nebulizer solution Take 3 mLs (2.5 mg total) by nebulization every 4 (four) hours as needed for wheezing or shortness of breath. 09/06/15   Street, Mercedes, PA-C  benzonatate (TESSALON) 100 MG capsule Take 1 capsule (100 mg total) by mouth every 8 (eight) hours. 05/29/17   Kellie Shropshire, PA-C  cetirizine (ZYRTEC) 10 MG tablet Take 10 mg by mouth daily.    [provider]  Cyanocobalamin (B-12 PO) Take 1 tablet by mouth daily.    [provider]  fluticasone (FLONASE) 50 MCG/ACT nasal spray Place 2 sprays into both nostrils daily. 09/06/15  Street, [place], PA-C  ibuprofen (ADVIL,MOTRIN) 800 MG tablet take 1 tablet by mouth every 8 hours if needed for MIGRAINE HEADACHE 11/18/15   [provider]  loratadine (CLARITIN) 10 MG tablet Take 1 tablet (10 mg total) by mouth daily. 05/29/17   Kellie ShropshireShrosbree, Emily J, PA-C  Multiple Vitamin (MULTIVITAMIN WITH MINERALS) TABS tablet Take 1 tablet by mouth daily.    [provider]  naproxen (NAPROSYN) 500 MG tablet Take 1 tablet (500 mg total) by mouth 2 (two) times daily. 05/29/17   Kellie ShropshireShrosbree, Emily J, PA-C    nortriptyline (PAMELOR) 50 MG capsule Take 50 mg by mouth at bedtime. 01/15/17   [provider]  SUMAtriptan (IMITREX) 100 MG tablet Take 100 mg by mouth every 2 (two) hours as needed for migraine. 08/29/16   [provider]  triamcinolone (NASACORT) 55 MCG/ACT AERO nasal inhaler Place 2 sprays into the nose daily. 05/29/17   Kellie ShropshireShrosbree, Emily J, PA-C    Family History Family History  Problem Relation Age of Onset  . Hypertension Mother   . Heart disease Mother   . Diabetes Mother   . Stroke Mother   . Hyperlipidemia Mother   . Hypertension Sister   . Thrombophlebitis Sister        clotting issue  . Depression Sister   . Heart disease Maternal Grandfather   . Drug abuse Father   . Alcohol abuse Maternal Uncle   . Kidney disease Maternal Uncle        failure  . Rheum arthritis Sister        arthritis vs fibromyalgia  . Heart murmur Sister   . Fibromyalgia Sister   . Depression Sister   . Hypertension Sister   . Anesthesia problems Neg Hx   . Hypotension Neg Hx   . Malignant hyperthermia Neg Hx   . Pseudochol deficiency Neg Hx     Social History Social History   Tobacco Use  . Smoking status: Current Every Day Smoker    Packs/day: 0.25    Types: Cigarettes  . Smokeless tobacco: Never Used  . Tobacco comment: Black and Mild  Substance Use Topics  . Alcohol use: Yes    Comment: occasional  . Drug use: No     Allergies   Prednisone   Review of Systems Review of Systems  All other systems reviewed and are negative.    Physical Exam Updated Vital Signs BP (!) 127/96 (BP Location: Right Arm)   Pulse 83   Temp 98 F (36.7 C) (Oral)   Resp 18   LMP 08/13/2017 (Exact Date)   SpO2 100%   Physical Exam  Constitutional: She is oriented to person, place, and time. She appears well-developed and well-nourished. No distress.  HENT:  Head: Atraumatic.  Ears: Normal TMs bilaterally Nose: Normal nares Throat: Uvula is midline no tonsillar  enlargement or exudates, no trismus  Eyes: Conjunctivae and EOM are normal. Pupils are equal, round, and reactive to light.  Neck: Normal range of motion. Neck supple.  No nuchal rigidity  Cardiovascular: Normal rate and regular rhythm.  Pulmonary/Chest: Effort normal and breath sounds normal.  Abdominal: Soft. She exhibits no distension.  Neurological: She is alert and oriented to person, place, and time. She has normal strength. No cranial nerve deficit or sensory deficit. Coordination normal. GCS eye subscore is 4. GCS verbal subscore is 5. GCS motor subscore is 6.  Skin: No rash noted.  Psychiatric: She has a normal mood and affect.  Nursing note  and vitals reviewed.    ED Treatments / Results  Labs (all labs ordered are listed, but only abnormal results are displayed) Labs Reviewed - No data to display  EKG  EKG Interpretation None       Radiology No results found.  Procedures Procedures (including critical care time)  Medications Ordered in ED Medications - No data to display   Initial Impression / Assessment and Plan / ED Course  I have reviewed the triage vital signs and the nursing notes.  Pertinent labs & imaging results that were available during my care of the patient were reviewed by me and considered in my medical decision making (see chart for details).     BP (!) 127/96 (BP Location: Right Arm)   Pulse 83   Temp 98 F (36.7 C) (Oral)   Resp 18   LMP 08/13/2017 (Exact Date)   SpO2 100%    Final Clinical Impressions(s) / ED Diagnoses   Final diagnoses:  Bad headache    ED Discharge Orders    None     Headache similar to previous, no fever, neck stiffness, neuro findings or new symptoms to suggest more serious etiology.  I don't think SAH, ICH, meningitis, encephalitis, mass at this time.  No recent trauma.  I don't feel imaging necessary at this time.  Plan to control symptoms.  5:38 PM Pt report mild improvement of her headache with  migraine cocktail.  Will give fioricet.  Recommend pt to keep a migraine diary and follow up closely with her neurologist for further care.  Otherwise pt is stable for discharge.  Return precaution given.    Fayrene Helperran, Jassmin Kemmerer, PA-C 08/29/17 1740    Rolland PorterJames, Mark, MD 09/11/17 [phone]

## 2017-10-15 NOTE — L&D Delivery Note (Signed)
Delivery Note   06/30/2018  Date of delivery: 06/30/18   Margaret Cline, 27 y.o., @ [id number],  [phone], who was admitted for Elective IOL with material obesity. I was called to the room when she progressed +2 station in the second stage of labor.  She pushed for 45/min.  She delivered a viable infant, cephalic and restituted to the ROT position over an intact perineum.  A nuchal cord   was not identified. The baby was placed on maternal abdomen while initial step of NRP were perfmored (Dry, Stimulated, and warmed). Hat placed on baby for thermoregulation. Delayed cord clamping was performed for 3 minutes.  Cord double clamped and cut.  Cord cut by father. Apgar scores were 8 and 9. Pitocin was started for active management of the third stage of labor. The placenta delivered spontaneously, shultz, with a 3 vessel cord, grossly intact and sent to LD.  Inspection revealed none. An examination of the vaginal vault and cervix was free from lacerations. The uterus was firm, bleeding stable.   Placenta and umbilical artery blood gas were not sent.  There were no complications during the procedure.  Mom and baby skin to skin following delivery. Left in stable condition.  Maternal Info: Anesthesia:Epidural Episiotomy: No Lacerations:  None Suture Repair: No Est. Blood Loss (mL):  400  Newborn Info: Baby Sex: female Circumcision: NO, Baby Female  APGAR (1 MIN): 8   APGAR (5 MINS): 9   APGAR (10 MINS):     Mom to postpartum.  Baby to Couplet care / Skin to Skin.  Dr Normand Sloopillard aware of the Birth.   Dawne Casali, CNM, NP-C 06/30/18 9:22 PM

## 2017-10-26 ENCOUNTER — Inpatient Hospital Stay (HOSPITAL_COMMUNITY)
Admission: AD | Admit: 2017-10-26 | Discharge: 2017-10-27 | Disposition: A | Discharge status: Home / Self Care | Payer: BLUE CROSS/BLUE SHIELD | Source: Ambulatory Visit | Attending: Obstetrics and Gynecology | Admitting: Obstetrics and Gynecology

## 2017-10-26 ENCOUNTER — Encounter (HOSPITAL_COMMUNITY): Payer: Self-pay | Admitting: *Deleted

## 2017-10-26 ENCOUNTER — Other Ambulatory Visit: Payer: Self-pay

## 2017-10-26 DIAGNOSIS — F17210 Nicotine dependence, cigarettes, uncomplicated: Secondary | ICD-10-CM | POA: Diagnosis not present

## 2017-10-26 DIAGNOSIS — Z3201 Encounter for pregnancy test, result positive: Secondary | ICD-10-CM | POA: Insufficient documentation

## 2017-10-26 DIAGNOSIS — R55 Syncope and collapse: Secondary | ICD-10-CM | POA: Diagnosis not present

## 2017-10-26 DIAGNOSIS — O26899 Other specified pregnancy related conditions, unspecified trimester: Secondary | ICD-10-CM

## 2017-10-26 DIAGNOSIS — R110 Nausea: Secondary | ICD-10-CM

## 2017-10-26 DIAGNOSIS — R000 Tachycardia, unspecified: Secondary | ICD-10-CM | POA: Diagnosis not present

## 2017-10-26 DIAGNOSIS — N911 Secondary amenorrhea: Secondary | ICD-10-CM

## 2017-10-26 DIAGNOSIS — R42 Dizziness and giddiness: Secondary | ICD-10-CM

## 2017-10-26 DIAGNOSIS — Z7982 Long term (current) use of aspirin: Secondary | ICD-10-CM | POA: Diagnosis not present

## 2017-10-26 LAB — COMPREHENSIVE METABOLIC PANEL
ALBUMIN: 3.3 g/dL — AB (ref 3.5–5.0)
ALK PHOS: 46 U/L (ref 38–126)
ALT: 42 U/L (ref 14–54)
AST: 39 U/L (ref 15–41)
Anion gap: 6 (ref 5–15)
BUN: 8 mg/dL (ref 6–20)
CALCIUM: 8.8 mg/dL — AB (ref 8.9–10.3)
CHLORIDE: 108 mmol/L (ref 101–111)
CO2: 24 mmol/L (ref 22–32)
CREATININE: 0.7 mg/dL (ref 0.44–1.00)
GFR calc Af Amer: >60 mL/min (ref >60)
GFR calc non Af Amer: >60 mL/min (ref >60)
GLUCOSE: 88 mg/dL (ref 65–99)
Potassium: 4.4 mmol/L (ref 3.5–5.1)
SODIUM: 138 mmol/L (ref 135–145)
Total Bilirubin: 0.4 mg/dL (ref 0.3–1.2)
Total Protein: 6.5 g/dL (ref 6.5–8.1)

## 2017-10-26 LAB — URINALYSIS, ROUTINE W REFLEX MICROSCOPIC
Bilirubin Urine: NEGATIVE
Glucose, UA: NEGATIVE mg/dL
Hgb urine dipstick: NEGATIVE
Ketones, ur: NEGATIVE mg/dL
LEUKOCYTES UA: NEGATIVE
NITRITE: NEGATIVE
PROTEIN: NEGATIVE mg/dL
Specific Gravity, Urine: 1.024 (ref 1.005–1.030)
pH: 5 (ref 5.0–8.0)

## 2017-10-26 LAB — POCT PREGNANCY, URINE: PREG TEST UR: POSITIVE — AB

## 2017-10-26 LAB — CBC
HCT: 38.5 % (ref 36.0–46.0)
HEMOGLOBIN: 13 g/dL (ref 12.0–15.0)
MCH: 30.2 pg (ref 26.0–34.0)
MCHC: 33.8 g/dL (ref 30.0–36.0)
MCV: 89.5 fL (ref 78.0–100.0)
Platelets: 241 10*3/uL (ref 150–400)
RBC: 4.3 MIL/uL (ref 3.87–5.11)
RDW: 15 % (ref 11.5–15.5)
WBC: 15.3 10*3/uL — AB (ref 4.0–10.5)

## 2017-10-26 MED ORDER — PROMETHAZINE HCL 25 MG PO TABS
25.0000 mg | ORAL_TABLET | Freq: Once | ORAL | Status: AC
  Administered 2017-10-26: 25 mg via ORAL
  Filled 2017-10-26: qty 1

## 2017-10-26 NOTE — MAU Note (Signed)
Think I may be pregnant. My period is late. Dizziness and hot flashes. LMP 09/29/17. Had cramp R side of back earlier but no pain now

## 2017-10-26 NOTE — MAU Provider Note (Signed)
Chief Complaint: Possible Pregnancy   First Provider Initiated Contact with Patient 10/26/17 2222     SUBJECTIVE HPI: Margaret Cline is a 27 y.o. [phone] female who presents to Maternity Admissions reporting dizziness, near-syncopal episode upon standing up today and concern for pregnancy. Patient's last menstrual period was 09/29/2017 (exact date). Hasn't taken pregnancy test.   Modifying factors: Hasn't tried anything for Sx Associated signs and symptoms: Pos for nausea, spitting. Neg for SOB, Chest pain, palpitations, fever, chills, abd pain, VB, urinary complaints, diarrhea, sick contacts, difficulties w/ speech or gait, HA, vision changes, weakness or vomiting.   Past Medical History:  Diagnosis Date  . Asthma   . Chlamydia 2008  . Chronic pain   . Constipation, chronic 11/13/11  . Depression    postpartum  . Gonorrhea   . History of chlamydia 03/07/2012  . History of trichomoniasis 03/07/2012  . Hx: UTI (urinary tract infection)    Frequently  . Irregular periods/menstrual cycles   . Migraines    Migraines  . Obese   . Trichomonas 2008  . Vaginal delivery--shoulder dystocia 07/10/2012   OB History  Gravida Para Term Preterm AB Living  4 2 2  0 1 2  SAB TAB Ectopic Multiple Live Births  0 1 0 0 2    # Outcome Date GA Lbr Len/2nd Weight Sex Delivery Anes PTL Lv  4 Current           3 Term 07/09/12 [redacted]w[redacted]d 12:28 / 00:19 8 lb 1.6 oz (3.674 kg) F Vag-Spont EPI  LIV  2 Term 05/2007 [redacted]w[redacted]d 19:00 7 lb 10.6 oz (3.476 kg) M Vag-Spont EPI N LIV     Birth Comments: Nuchal Cord x2, NICU.(VL)  1 TAB     U    DEC     Past Surgical History:  Procedure Laterality Date  . INDUCED ABORTION    . WISDOM TOOTH EXTRACTION     Social History   Socioeconomic History  . Marital status: Single    Spouse name: Not on file  . Number of children: Not on file  . Years of education: Not on file  . Highest education level: Not on file  Social Needs  . Financial resource strain: Not on file   . Food insecurity - worry: Not on file  . Food insecurity - inability: Not on file  . Transportation needs - medical: Not on file  . Transportation needs - non-medical: Not on file  Occupational History  . Not on file  Tobacco Use  . Smoking status: Current Every Day Smoker    Packs/day: 0.25    Types: Cigarettes  . Smokeless tobacco: Never Used  . Tobacco comment: Black and Mild  Substance and Sexual Activity  . Alcohol use: Yes    Comment: occasional  . Drug use: No  . Sexual activity: Yes    Birth control/protection: Condom    Comment: Nexplanon removed 4 weeks ago  Other Topics Concern  . Not on file  Social History Narrative  . Not on file   Family History  Problem Relation Age of Onset  . Hypertension Mother   . Heart disease Mother   . Diabetes Mother   . Stroke Mother   . Hyperlipidemia Mother   . Hypertension Sister   . Thrombophlebitis Sister        clotting issue  . Depression Sister   . Heart disease Maternal Grandfather   . Drug abuse Father   . Alcohol abuse Maternal  Uncle   . Kidney disease Maternal Uncle        failure  . Rheum arthritis Sister        arthritis vs fibromyalgia  . Heart murmur Sister   . Fibromyalgia Sister   . Depression Sister   . Hypertension Sister   . Anesthesia problems Neg Hx   . Hypotension Neg Hx   . Malignant hyperthermia Neg Hx   . Pseudochol deficiency Neg Hx    No current facility-administered medications on file prior to encounter.    Current Outpatient Medications on File Prior to Encounter  Medication Sig Dispense Refill  . albuterol (PROVENTIL) (2.5 MG/3ML) 0.083% nebulizer solution Take 3 mLs (2.5 mg total) by nebulization every 4 (four) hours as needed for wheezing or shortness of breath. 30 vial 0  . loratadine (CLARITIN) 10 MG tablet Take 1 tablet (10 mg total) by mouth daily. (Patient taking differently: Take 10 mg daily as needed by mouth for allergies or rhinitis. ) 20 tablet 0  . albuterol (PROAIR HFA)  108 (90 BASE) MCG/ACT inhaler Inhale 2 puffs into the lungs every 4 (four) hours as needed for wheezing or shortness of breath. For shortness of breath/wheezing    . aspirin-acetaminophen-caffeine (EXCEDRIN MIGRAINE) 250-250-65 MG tablet Take 1-2 tablets every 6 (six) hours as needed by mouth for headache or migraine.    . butalbital-acetaminophen-caffeine (FIORICET, ESGIC) 50-325-40 MG tablet Take 1-2 tablets every 6 (six) hours as needed by mouth for headache. 20 tablet 0  . cetirizine (ZYRTEC) 10 MG tablet Take 10 mg daily as needed by mouth for allergies or rhinitis.     . fluticasone (FLONASE) 50 MCG/ACT nasal spray Place 2 sprays into both nostrils daily. (Patient taking differently: Place 2 sprays daily as needed into both nostrils for allergies or rhinitis. ) 16 g 0  . naproxen sodium (ALEVE) 220 MG tablet Take 220-440 mg every 6 (six) hours as needed by mouth (for migraines).    . promethazine (PHENERGAN) 25 MG tablet Take 1 tablet (25 mg total) by mouth every 6 (six) hours as needed for nausea. 12 tablet 0   Allergies  Allergen Reactions  . Prednisone Nausea And Vomiting    I have reviewed patient's Past Medical Hx, Surgical Hx, Family Hx, Social Hx, medications and allergies.   Review of Systems  OBJECTIVE Patient Vitals for the past 24 hrs:  BP Temp Pulse Resp SpO2  10/27/17 0053 120/76 - 95 16 -  10/27/17 0051 - - 96 - -  10/26/17 2244 134/89 - (!) 126 - -  10/26/17 2240 140/89 - (!) 124 - -  10/26/17 2239 139/85 - (!) 119 - -  10/26/17 2238 137/82 - (!) 111 - -  10/26/17 2212 - - - - 100 %  10/26/17 2208 138/86 98.2 F (36.8 C) (!) 114 18 -   Constitutional: Well-developed, well-nourished female in no acute distress.  Skin: No pallor Cardiovascular: Mild tachycardia  Respiratory: normal rate and effort.  GI: Abd soft, non-tender. Neurologic: Alert and oriented x 4. Nml Speech and gait.  GU: Deferred  LAB RESULTS Results for orders placed or performed during the  hospital encounter of 10/26/17 (from the past 24 hour(s))  Urinalysis, Routine w reflex microscopic     Status: Abnormal   Collection Time: 10/26/17 10:30 PM  Result Value Ref Range   Color, Urine YELLOW YELLOW   APPearance HAZY (A) CLEAR   Specific Gravity, Urine 1.024 1.005 - 1.030   pH 5.0 5.0 -  8.0   Glucose, UA NEGATIVE NEGATIVE mg/dL   Hgb urine dipstick NEGATIVE NEGATIVE   Bilirubin Urine NEGATIVE NEGATIVE   Ketones, ur NEGATIVE NEGATIVE mg/dL   Protein, ur NEGATIVE NEGATIVE mg/dL   Nitrite NEGATIVE NEGATIVE   Leukocytes, UA NEGATIVE NEGATIVE  Pregnancy, urine POC     Status: Abnormal   Collection Time: 10/26/17 10:41 PM  Result Value Ref Range   Preg Test, Ur POSITIVE (A) NEGATIVE  CBC     Status: Abnormal   Collection Time: 10/26/17 11:07 PM  Result Value Ref Range   WBC 15.3 (H) 4.0 - 10.5 K/uL   RBC 4.30 3.87 - 5.11 MIL/uL   Hemoglobin 13.0 12.0 - 15.0 g/dL   HCT 53.6 64.4 - 03.4 %   MCV 89.5 78.0 - 100.0 fL   MCH 30.2 26.0 - 34.0 pg   MCHC 33.8 30.0 - 36.0 g/dL   RDW 74.2 59.5 - 63.8 %   Platelets 241 150 - 400 K/uL  Comprehensive metabolic panel     Status: Abnormal   Collection Time: 10/26/17 11:07 PM  Result Value Ref Range   Sodium 138 135 - 145 mmol/L   Potassium 4.4 3.5 - 5.1 mmol/L   Chloride 108 101 - 111 mmol/L   CO2 24 22 - 32 mmol/L   Glucose, Bld 88 65 - 99 mg/dL   BUN 8 6 - 20 mg/dL   Creatinine, Ser 7.56 0.44 - 1.00 mg/dL   Calcium 8.8 (L) 8.9 - 10.3 mg/dL   Total Protein 6.5 6.5 - 8.1 g/dL   Albumin 3.3 (L) 3.5 - 5.0 g/dL   AST 39 15 - 41 U/L   ALT 42 14 - 54 U/L   Alkaline Phosphatase 46 38 - 126 U/L   Total Bilirubin 0.4 0.3 - 1.2 mg/dL   GFR calc non Af Amer >60 >60 mL/min   GFR calc Af Amer >60 >60 mL/min   Anion gap 6 5 - 15    IMAGING No results found.  MAU COURSE Orders Placed This Encounter  Procedures  . Urinalysis, Routine w reflex microscopic  . CBC  . Comprehensive metabolic panel  . Orthostatic vital signs  .  Encourage fluids  . Pregnancy, urine POC  . Discharge patient   Meds ordered this encounter  Medications  . promethazine (PHENERGAN) tablet 25 mg  . promethazine (PHENERGAN) 25 MG tablet    Sig: Take 1 tablet (25 mg total) by mouth every 6 (six) hours as needed.    Dispense:  30 tablet    Refill:  1    Order Specific Question:   Supervising Provider    Answer:   CONSTANT, PEGGY [[id number]]  . Prenat w/o A Vit-FeFum-FePo-FA (CONCEPT OB) 130-92.4-1 MG CAPS    Sig: Take 1 tablet by mouth daily.    Dispense:  30 capsule    Refill:  12    Order Specific Question:   Supervising Provider    Answer:   CONSTANT, PEGGY [[id number]]    MDM - Pos UPT. Rx PNV. Start PNC. List of providers given. - Lightheadedness likely 2/2 inadequate hydration, resolved w/ PO hydration.  - Nausea resolved w/ Phenergan. Tolerating PO's. - tachycardia likely 2/2 inadequate hydration, resolved w/ PO hydration.  ASSESSMENT 1. Postural dizziness with near syncope   2. Positive pregnancy test   3. Pregnancy related nausea, antepartum   4. Tachycardia     PLAN Discharge home in stable condition. First trimester precautions Encouraged small, frequent snacks and drinks.  Change positions slowly. Follow-up Information    [facility] Follow up.   Why:  in pregnancy emergencies Contact information: [address] [id number] [place] [id number] [phone]       Obstetrician of your choice Follow up.   Why:  Start prenatal care         Allergies as of 10/27/2017      Reactions   Prednisone Nausea And Vomiting      Medication List    STOP taking these medications   ALEVE 220 MG tablet Generic drug:  naproxen sodium   aspirin-acetaminophen-caffeine 250-250-65 MG tablet Commonly known as:  EXCEDRIN MIGRAINE   butalbital-acetaminophen-caffeine 50-325-40 MG tablet Commonly known as:  FIORICET, ESGIC   fluticasone 50 MCG/ACT nasal  spray Commonly known as:  FLONASE     TAKE these medications   cetirizine 10 MG tablet Commonly known as:  ZYRTEC Take 10 mg daily as needed by mouth for allergies or rhinitis.   CONCEPT OB 130-92.4-1 MG Caps Take 1 tablet by mouth daily.   loratadine 10 MG tablet Commonly known as:  CLARITIN Take 1 tablet (10 mg total) by mouth daily. What changed:    when to take this  reasons to take this   albuterol (2.5 MG/3ML) 0.083% nebulizer solution Commonly known as:  PROVENTIL Take 3 mLs (2.5 mg total) by nebulization every 4 (four) hours as needed for wheezing or shortness of breath.   PROAIR HFA 108 (90 Base) MCG/ACT inhaler Generic drug:  albuterol Inhale 2 puffs into the lungs every 4 (four) hours as needed for wheezing or shortness of breath. For shortness of breath/wheezing     ASK your doctor about these medications   promethazine 25 MG tablet Commonly known as:  PHENERGAN Take 1 tablet (25 mg total) by mouth every 6 (six) hours as needed for nausea. Ask about: Which instructions should I use?   promethazine 25 MG tablet Commonly known as:  PHENERGAN Take 1 tablet (25 mg total) by mouth every 6 (six) hours as needed. Ask about: Which instructions should I use?        Katrinka BlazingSmith, [place], CNM 10/27/2017  1:16 AM

## 2017-10-26 NOTE — MAU Note (Signed)
Pt reports dizziness for a couple a days. Pt has had a cold since Friday. Pt also has had some nausea and a slight headache. Pt was supposed to have period on Wednesday, but it has not come. Pt using condoms for birth control. Nexplanon was removed in April dur to headaches.

## 2017-10-27 DIAGNOSIS — R55 Syncope and collapse: Secondary | ICD-10-CM | POA: Diagnosis not present

## 2017-10-27 MED ORDER — PROMETHAZINE HCL 25 MG PO TABS: 25.0000 mg | ORAL_TABLET | Freq: Four times a day (QID) | ORAL | 1 refills | Status: DC | PRN

## 2017-10-27 MED ORDER — CONCEPT OB 130-92.4-1 MG PO CAPS: 1.0000 | ORAL_CAPSULE | Freq: Every day | ORAL | 12 refills | Status: DC

## 2017-10-27 NOTE — Discharge Instructions (Signed)
[facility] [employer] for [employer] at [facility]       Phone: [phone]  Center for [employer] at [employer] Phone: [phone]  Center for [employer] at [place]  Phone: [phone]  Center for [employer] at [employer]  Phone: [phone]  Center for [facility] at [place]  Phone: [phone]  [facility]       Phone: [phone]  [facility]    Phone: [phone]   [facility] [place])    Phone: [phone]  [facility]    Phone: [phone]  [facility]    Phone: [phone]  [facility]    Phone: [phone]  [facility]       Phone: [phone]   [facility]  Phone: [phone]  [name]    Phone: [phone]  [facility] [place]   Phone: [phone]  [facility]      Phone: [phone]  [facility]    Phone: [phone]   First Trimester of Pregnancy The first trimester of pregnancy is from week 1 until the end of week 13 (months 1 through 3). During this time, your baby will begin to develop inside you. At 6-8 weeks, the eyes and face are formed, and the heartbeat can be seen on ultrasound. At the end of 12 weeks, all the baby's organs are formed. Prenatal care is all the medical care you receive before the birth of your baby. Make sure you get good prenatal care and follow all of your doctor's instructions. Follow these instructions at home: Medicines  Take over-the-counter and prescription medicines only as told by your doctor. Some medicines are safe and some medicines are not safe during pregnancy.  Take a prenatal vitamin that contains at least 600 micrograms (mcg) of folic acid.  If you have trouble pooping  (constipation), take medicine that will make your stool soft (stool softener) if your doctor approves. Eating and drinking  Eat regular, healthy meals.  Your doctor will tell you the amount of weight gain that is right for you.  Avoid raw meat and uncooked cheese.  If you feel sick to your stomach (nauseous) or throw up (vomit): ? Eat 4 or 5 small meals a day instead of 3 large meals. ? Try eating a few soda crackers. ? Drink liquids between meals instead of during meals.  To prevent constipation: ? Eat foods that are high in fiber, like fresh fruits and vegetables, whole grains, and beans. ? Drink enough fluids to keep your pee (urine) clear or pale yellow. Activity  Exercise only as told by your doctor. Stop exercising if you have cramps or pain in your lower belly (abdomen) or low back.  Do not exercise if it is too hot, too humid, or if you are in a place of great height (high altitude).  Try to avoid standing for long periods of time. Move your legs often if you must stand in one place for a long time.  Avoid heavy lifting.  Wear low-heeled shoes. Sit and stand up straight.  You can have sex unless your doctor tells you not to. Relieving pain and discomfort  Wear a good support bra if your breasts are sore.  Take warm water baths (sitz baths) to soothe pain or discomfort caused by hemorrhoids. Use hemorrhoid cream if your doctor says it is okay.  Rest with  your legs raised if you have leg cramps or low back pain.  If you have puffy, bulging veins (varicose veins) in your legs: ? Wear support hose or compression stockings as told by your doctor. ? Raise (elevate) your feet for 15 minutes, 3-4 times a day. ? Limit salt in your food. Prenatal care  Schedule your prenatal visits by the twelfth week of pregnancy.  Write down your questions. Take them to your prenatal visits.  Keep all your prenatal visits as told by your doctor. This is important. Safety  Wear your  seat belt at all times when driving.  Make a list of emergency phone numbers. The list should include numbers for family, friends, the hospital, and police and fire departments. General instructions  Ask your doctor for a referral to a local prenatal class. Begin classes no later than at the start of month 6 of your pregnancy.  Ask for help if you need counseling or if you need help with nutrition. Your doctor can give you advice or tell you where to go for help.  Do not use hot tubs, steam rooms, or saunas.  Do not douche or use tampons or scented sanitary pads.  Do not cross your legs for long periods of time.  Avoid all herbs and alcohol. Avoid drugs that are not approved by your doctor.  Do not use any tobacco products, including cigarettes, chewing tobacco, and electronic cigarettes. If you need help quitting, ask your doctor. You may get counseling or other support to help you quit.  Avoid cat litter boxes and soil used by cats. These carry germs that can cause birth defects in the baby and can cause a loss of your baby (miscarriage) or stillbirth.  Visit your dentist. At home, brush your teeth with a soft toothbrush. Be gentle when you floss. Contact a doctor if:  You are dizzy.  You have mild cramps or pressure in your lower belly.  You have a nagging pain in your belly area.  You continue to feel sick to your stomach, you throw up, or you have watery poop (diarrhea).  You have a bad smelling fluid coming from your vagina.  You have pain when you pee (urinate).  You have increased puffiness (swelling) in your face, hands, legs, or ankles. Get help right away if:  You have a fever.  You are leaking fluid from your vagina.  You have spotting or bleeding from your vagina.  You have very bad belly cramping or pain.  You gain or lose weight rapidly.  You throw up blood. It may look like coffee grounds.  You are around people who have [place] measles, fifth  disease, or chickenpox.  You have a very bad headache.  You have shortness of breath.  You have any kind of trauma, such as from a fall or a car accident. Summary  The first trimester of pregnancy is from week 1 until the end of week 13 (months 1 through 3).  To take care of yourself and your unborn baby, you will need to eat healthy meals, take medicines only if your doctor tells you to do so, and do activities that are safe for you and your baby.  Keep all follow-up visits as told by your doctor. This is important as your doctor will have to ensure that your baby is healthy and growing well. This information is not intended to replace advice given to you by your health care provider. Make sure you discuss any questions  you have with your health care provider. Document Released: 03/19/2008 Document Revised: 10/09/2016 Document Reviewed: 10/09/2016 Elsevier Interactive Patient Education  2017 [employer].

## 2017-11-06 ENCOUNTER — Inpatient Hospital Stay (HOSPITAL_COMMUNITY): Payer: Medicaid Other

## 2017-11-06 ENCOUNTER — Encounter (HOSPITAL_COMMUNITY): Payer: Self-pay | Admitting: *Deleted

## 2017-11-06 ENCOUNTER — Other Ambulatory Visit: Payer: Self-pay

## 2017-11-06 ENCOUNTER — Inpatient Hospital Stay (HOSPITAL_COMMUNITY)
Admission: AD | Admit: 2017-11-06 | Discharge: 2017-11-06 | Disposition: A | Discharge status: Home / Self Care | Payer: Medicaid Other | Source: Ambulatory Visit | Attending: Obstetrics and Gynecology | Admitting: Obstetrics and Gynecology

## 2017-11-06 DIAGNOSIS — O99341 Other mental disorders complicating pregnancy, first trimester: Secondary | ICD-10-CM | POA: Insufficient documentation

## 2017-11-06 DIAGNOSIS — R109 Unspecified abdominal pain: Secondary | ICD-10-CM | POA: Diagnosis not present

## 2017-11-06 DIAGNOSIS — F329 Major depressive disorder, single episode, unspecified: Secondary | ICD-10-CM | POA: Insufficient documentation

## 2017-11-06 DIAGNOSIS — O99511 Diseases of the respiratory system complicating pregnancy, first trimester: Secondary | ICD-10-CM | POA: Insufficient documentation

## 2017-11-06 DIAGNOSIS — O99331 Smoking (tobacco) complicating pregnancy, first trimester: Secondary | ICD-10-CM | POA: Insufficient documentation

## 2017-11-06 DIAGNOSIS — O99351 Diseases of the nervous system complicating pregnancy, first trimester: Secondary | ICD-10-CM | POA: Diagnosis not present

## 2017-11-06 DIAGNOSIS — O99211 Obesity complicating pregnancy, first trimester: Secondary | ICD-10-CM | POA: Insufficient documentation

## 2017-11-06 DIAGNOSIS — Z888 Allergy status to other drugs, medicaments and biological substances status: Secondary | ICD-10-CM | POA: Diagnosis not present

## 2017-11-06 DIAGNOSIS — O26891 Other specified pregnancy related conditions, first trimester: Secondary | ICD-10-CM | POA: Diagnosis not present

## 2017-11-06 DIAGNOSIS — Z79899 Other long term (current) drug therapy: Secondary | ICD-10-CM | POA: Diagnosis not present

## 2017-11-06 DIAGNOSIS — O23591 Infection of other part of genital tract in pregnancy, first trimester: Secondary | ICD-10-CM | POA: Insufficient documentation

## 2017-11-06 DIAGNOSIS — Z3A01 Less than 8 weeks gestation of pregnancy: Secondary | ICD-10-CM | POA: Insufficient documentation

## 2017-11-06 DIAGNOSIS — B9689 Other specified bacterial agents as the cause of diseases classified elsewhere: Secondary | ICD-10-CM

## 2017-11-06 DIAGNOSIS — J45909 Unspecified asthma, uncomplicated: Secondary | ICD-10-CM | POA: Diagnosis not present

## 2017-11-06 DIAGNOSIS — N760 Acute vaginitis: Secondary | ICD-10-CM

## 2017-11-06 DIAGNOSIS — G8929 Other chronic pain: Secondary | ICD-10-CM | POA: Insufficient documentation

## 2017-11-06 DIAGNOSIS — F17210 Nicotine dependence, cigarettes, uncomplicated: Secondary | ICD-10-CM | POA: Diagnosis not present

## 2017-11-06 DIAGNOSIS — O209 Hemorrhage in early pregnancy, unspecified: Secondary | ICD-10-CM | POA: Insufficient documentation

## 2017-11-06 LAB — CBC
HCT: 38.2 % (ref 36.0–46.0)
Hemoglobin: 12.7 g/dL (ref 12.0–15.0)
MCH: 29.5 pg (ref 26.0–34.0)
MCHC: 33.2 g/dL (ref 30.0–36.0)
MCV: 88.8 fL (ref 78.0–100.0)
Platelets: 275 10*3/uL (ref 150–400)
RBC: 4.3 MIL/uL (ref 3.87–5.11)
RDW: 14.6 % (ref 11.5–15.5)
WBC: 12 10*3/uL — ABNORMAL HIGH (ref 4.0–10.5)

## 2017-11-06 LAB — URINALYSIS, ROUTINE W REFLEX MICROSCOPIC
Bacteria, UA: NONE SEEN
Bilirubin Urine: NEGATIVE
Glucose, UA: NEGATIVE mg/dL
Hgb urine dipstick: NEGATIVE
Ketones, ur: NEGATIVE mg/dL
Nitrite: NEGATIVE
Protein, ur: NEGATIVE mg/dL
Specific Gravity, Urine: 1.028 (ref 1.005–1.030)
pH: 7 (ref 5.0–8.0)

## 2017-11-06 LAB — WET PREP, GENITAL
SPERM: NONE SEEN
Trich, Wet Prep: NONE SEEN
Yeast Wet Prep HPF POC: NONE SEEN

## 2017-11-06 LAB — HCG, QUANTITATIVE, PREGNANCY: hCG, Beta Chain, Quant, S: 11244 m[IU]/mL — ABNORMAL HIGH (ref <5)

## 2017-11-06 MED ORDER — METRONIDAZOLE 500 MG PO TABS: 500.0000 mg | ORAL_TABLET | Freq: Two times a day (BID) | ORAL | 0 refills | Status: DC

## 2017-11-06 NOTE — MAU Provider Note (Signed)
History     CSN: [id number]  Arrival date and time: 11/06/17 1619   First Provider Initiated Contact with Patient 11/06/17 1703      Chief Complaint  Patient presents with  . Vaginal Discharge  . Abdominal Pain   HPI Ms Margaret Cline is 26yo [id number] pregnant female at [redacted]w[redacted]d via LMP presenting with vaginal bleeding and abdominal pain. VB began 3 days ago, mostly brown without clots. Severe cramps started this morning. Endorses vomiting x6 yesterday. Is able to keep down food and fluid today. Denies fever, chills, night sweats, HA, lightheadedness, dyspnea, palpitations, body aches, dysuria, constipation. Has tried phenergan for nausea, with some relief.  OB History    Gravida Para Term Preterm AB Living   4 2 2  0 1 2   SAB TAB Ectopic Multiple Live Births   0 1 0 0 2      Past Medical History:  Diagnosis Date  . Asthma   . Chlamydia 2008  . Chronic pain   . Constipation, chronic 11/13/11  . Depression    postpartum  . Gonorrhea   . History of chlamydia 03/07/2012  . History of trichomoniasis 03/07/2012  . Hx: UTI (urinary tract infection)    Frequently  . Irregular periods/menstrual cycles   . Migraines    Migraines  . Obese   . Trichomonas 2008  . Vaginal delivery--shoulder dystocia 07/10/2012    Past Surgical History:  Procedure Laterality Date  . INDUCED ABORTION    . WISDOM TOOTH EXTRACTION      Family History  Problem Relation Age of Onset  . Hypertension Mother   . Heart disease Mother   . Diabetes Mother   . Stroke Mother   . Hyperlipidemia Mother   . Hypertension Sister   . Thrombophlebitis Sister        clotting issue  . Depression Sister   . Heart disease Maternal Grandfather   . Drug abuse Father   . Alcohol abuse Maternal Uncle   . Kidney disease Maternal Uncle        failure  . Rheum arthritis Sister        arthritis vs fibromyalgia  . Heart murmur Sister   . Fibromyalgia Sister   . Depression Sister   . Hypertension Sister   .  Anesthesia problems Neg Hx   . Hypotension Neg Hx   . Malignant hyperthermia Neg Hx   . Pseudochol deficiency Neg Hx     Social History   Tobacco Use  . Smoking status: Current Every Day Smoker    Packs/day: 0.25    Types: Cigarettes  . Smokeless tobacco: Never Used  . Tobacco comment: Black and Mild  Substance Use Topics  . Alcohol use: Yes    Comment: occasional  . Drug use: No    Allergies:  Allergies  Allergen Reactions  . Prednisone Nausea And Vomiting    No medications prior to admission.    Review of Systems  Constitutional: Negative for appetite change, chills, diaphoresis and fever.  HENT: Voice change: generalized.   Respiratory: Negative for shortness of breath.   Cardiovascular: Negative for chest pain, palpitations and leg swelling.  Gastrointestinal: Positive for abdominal pain, nausea and vomiting. Negative for abdominal distention, blood in stool, constipation and diarrhea.  Genitourinary: Positive for vaginal bleeding and vaginal discharge Manson Passey Discharge). Negative for difficulty urinating, dysuria, flank pain, urgency and vaginal pain.  Neurological: Negative for dizziness, syncope, weakness, light-headedness and headaches.   Physical Exam   Blood pressure  121/81, pulse 81, temperature 98.1 F (36.7 C), temperature source Oral, resp. rate 17, weight 101.7 kg (224 lb 4 oz), last menstrual period 09/29/2017.  Physical Exam  Nursing note and vitals reviewed. Constitutional: She is oriented to person, place, and time. She appears well-developed and well-nourished. No distress.  HENT:  Head: Normocephalic and atraumatic.  Cardiovascular: Normal rate, regular rhythm and normal heart sounds.  No murmur heard. Respiratory: Effort normal and breath sounds normal. No respiratory distress. She has no wheezes. She has no rales.  GI: Soft. Bowel sounds are normal. She exhibits no distension and no mass. There is tenderness (Lower abdomen). There is guarding.  There is no rebound.  Neurological: She is alert and oriented to person, place, and time.  Skin: Skin is warm and dry.  Psychiatric: She has a normal mood and affect. Her behavior is normal. Judgment and thought content normal.    MAU Course  Procedures TVUS: Gestational and yolk sac present. No fetal pole. No heartbeat yet. Recommended to f/u with US in 14 days per Rad  MDM MSE Exam Labs: CBC, B-hCG Quant, Wet Prep, GC/Chlamydia, ABO/RH  Vitals stable throughout. Reviewed labs, all not indicative of acute disease. GCC pending, will follow up. 1st trimester vaginal bleeding, confirmed IUP via TVUS, however no heartbeat detected yet, too soon to determine viability. Will follow up with US in 10 days to reassess. Pt informed about the risk of nonviable pregnancy.  Assessment and Plan     ICD-10-CM   1. Abdominal pain during pregnancy in first trimester O26.891 Discharge patient   R10.9 US OB Transvaginal  2. Vaginal bleeding in pregnancy, first trimester O20.9 Discharge patient    US OB Transvaginal  3. BV (bacterial vaginosis) N76.0 Discharge patient   B96.89 US OB Transvaginal   - Follow up in clinic in 10 days with US for fetal viability - Flagyl 500mg  BID x7days - Encouraged increased fluid and tylenol for pain prn  Asa Saunasathan J Trezure Cronk 11/06/2017, 8:08 PM

## 2017-11-06 NOTE — MAU Provider Note (Signed)
Chief Complaint: Vaginal Discharge and Abdominal Pain   First Provider Initiated Contact with Patient 11/06/17 1703      SUBJECTIVE HPI: Margaret Cline is a 27 y.o. [id number] at 7317w3d by LMP who presents to maternity admissions reporting onset of brown vaginal bleeding 3 days ago and severe abdominal cramping today.  She reports unsure LMP with last bleeding on 12/16 that was lighter than her usual periods. Her pain is intermittent, low in both sides of her abdomen and cramping pain.  It is associated with nausea, relieved by Phenergan.  She has not tried any other treatments.  There are no other associated symptoms. She denies vaginal itching/burning, urinary symptoms, h/a, dizziness, or fever/chills.     HPI  Past Medical History:  Diagnosis Date  . Asthma   . Chlamydia 2008  . Chronic pain   . Constipation, chronic 11/13/11  . Depression    postpartum  . Gonorrhea   . History of chlamydia 03/07/2012  . History of trichomoniasis 03/07/2012  . Hx: UTI (urinary tract infection)    Frequently  . Irregular periods/menstrual cycles   . Migraines    Migraines  . Obese   . Trichomonas 2008  . Vaginal delivery--shoulder dystocia 07/10/2012   Past Surgical History:  Procedure Laterality Date  . INDUCED ABORTION    . WISDOM TOOTH EXTRACTION     Social History   Socioeconomic History  . Marital status: Single    Spouse name: Not on file  . Number of children: Not on file  . Years of education: Not on file  . Highest education level: Not on file  Social Needs  . Financial resource strain: Not on file  . Food insecurity - worry: Not on file  . Food insecurity - inability: Not on file  . Transportation needs - medical: Not on file  . Transportation needs - non-medical: Not on file  Occupational History  . Not on file  Tobacco Use  . Smoking status: Current Every Day Smoker    Packs/day: 0.25    Types: Cigarettes  . Smokeless tobacco: Never Used  . Tobacco comment: Black and Mild   Substance and Sexual Activity  . Alcohol use: Yes    Comment: occasional  . Drug use: No  . Sexual activity: Yes    Birth control/protection: Condom    Comment: Nexplanon removed 4 weeks ago  Other Topics Concern  . Not on file  Social History Narrative  . Not on file   No current facility-administered medications on file prior to encounter.    Current Outpatient Medications on File Prior to Encounter  Medication Sig Dispense Refill  . albuterol (PROAIR HFA) 108 (90 BASE) MCG/ACT inhaler Inhale 2 puffs into the lungs every 4 (four) hours as needed for wheezing or shortness of breath. For shortness of breath/wheezing    . albuterol (PROVENTIL) (2.5 MG/3ML) 0.083% nebulizer solution Take 3 mLs (2.5 mg total) by nebulization every 4 (four) hours as needed for wheezing or shortness of breath. 30 vial 0  . cetirizine (ZYRTEC) 10 MG tablet Take 10 mg daily as needed by mouth for allergies or rhinitis.     Marland Kitchen. loratadine (CLARITIN) 10 MG tablet Take 1 tablet (10 mg total) by mouth daily. (Patient taking differently: Take 10 mg daily as needed by mouth for allergies or rhinitis. ) 20 tablet 0  . Prenat w/o A Vit-FeFum-FePo-FA (CONCEPT OB) 130-92.4-1 MG CAPS Take 1 tablet by mouth daily. 30 capsule 12  . promethazine (PHENERGAN) 25  MG tablet Take 1 tablet (25 mg total) by mouth every 6 (six) hours as needed for nausea. 12 tablet 0  . promethazine (PHENERGAN) 25 MG tablet Take 1 tablet (25 mg total) by mouth every 6 (six) hours as needed. 30 tablet 1   Allergies  Allergen Reactions  . Prednisone Nausea And Vomiting    ROS:  Review of Systems  Constitutional: Negative for chills, fatigue and fever.  Respiratory: Negative for shortness of breath.   Cardiovascular: Negative for chest pain.  Gastrointestinal: Positive for abdominal pain.  Genitourinary: Positive for pelvic pain and vaginal bleeding. Negative for difficulty urinating, dysuria, flank pain, vaginal discharge and vaginal pain.   Neurological: Negative for dizziness and headaches.  Psychiatric/Behavioral: Negative.      I have reviewed patient's Past Medical Hx, Surgical Hx, Family Hx, Social Hx, medications and allergies.   Physical Exam   Patient Vitals for the past 24 hrs:  BP Temp Temp src Pulse Resp Weight  11/06/17 1634 126/81 99.1 F (37.3 C) Oral 90 17 224 lb 4 oz (101.7 kg)   Constitutional: Well-developed, well-nourished female in no acute distress.  Cardiovascular: normal rate Respiratory: normal effort GI: Abd soft, non-tender. Pos BS x 4 MS: Extremities nontender, no edema, normal ROM Neurologic: Alert and oriented x 4.  GU: Neg CVAT.  PELVIC EXAM: Cervix pink, visually closed, without lesion, scant thin white discharge, vaginal walls and external genitalia normal Bimanual exam: Cervix 0/long/high, firm, anterior, neg CMT, uterus nontender, nonenlarged, adnexa without tenderness, enlargement, or mass    LAB RESULTS Results for orders placed or performed during the hospital encounter of 11/06/17 (from the past 24 hour(s))  Urinalysis, Routine w reflex microscopic     Status: Abnormal   Collection Time: 11/06/17  4:37 PM  Result Value Ref Range   Color, Urine YELLOW YELLOW   APPearance HAZY (A) CLEAR   Specific Gravity, Urine 1.028 1.005 - 1.030   pH 7.0 5.0 - 8.0   Glucose, UA NEGATIVE NEGATIVE mg/dL   Hgb urine dipstick NEGATIVE NEGATIVE   Bilirubin Urine NEGATIVE NEGATIVE   Ketones, ur NEGATIVE NEGATIVE mg/dL   Protein, ur NEGATIVE NEGATIVE mg/dL   Nitrite NEGATIVE NEGATIVE   Leukocytes, UA TRACE (A) NEGATIVE   RBC / HPF 0-5 0 - 5 RBC/hpf   WBC, UA 0-5 0 - 5 WBC/hpf   Bacteria, UA NONE SEEN NONE SEEN   Squamous Epithelial / LPF 0-5 (A) NONE SEEN   Mucus PRESENT   CBC     Status: Abnormal   Collection Time: 11/06/17  5:09 PM  Result Value Ref Range   WBC 12.0 (H) 4.0 - 10.5 K/uL   RBC 4.30 3.87 - 5.11 MIL/uL   Hemoglobin 12.7 12.0 - 15.0 g/dL   HCT 16.1 09.6 - 04.5 %   MCV  88.8 78.0 - 100.0 fL   MCH 29.5 26.0 - 34.0 pg   MCHC 33.2 30.0 - 36.0 g/dL   RDW 40.9 81.1 - 91.4 %   Platelets 275 150 - 400 K/uL  hCG, quantitative, pregnancy     Status: Abnormal   Collection Time: 11/06/17  5:09 PM  Result Value Ref Range   hCG, Beta Chain, Quant, S 11,244 (H) <5 mIU/mL  Wet prep, genital     Status: Abnormal   Collection Time: 11/06/17  5:55 PM  Result Value Ref Range   Yeast Wet Prep HPF POC NONE SEEN NONE SEEN   Trich, Wet Prep NONE SEEN NONE SEEN   Clue  Cells Wet Prep HPF POC PRESENT (A) NONE SEEN   WBC, Wet Prep HPF POC FEW (A) NONE SEEN   Sperm NONE SEEN        IMAGING US Ob Comp Less 14 Wks  Result Date: 11/06/2017 CLINICAL DATA:  Bleeding and cramping. Beta HCG 11,244. Gestational age by last menstrual period 5 weeks and 3 days. EXAM: OBSTETRIC <14 WK [place] AND TRANSVAGINAL OB US TECHNIQUE: Both transabdominal and transvaginal ultrasound examinations were performed for complete evaluation of the gestation as well as the maternal uterus, adnexal regions, and pelvic cul-de-sac. Transvaginal technique was performed to assess early pregnancy. COMPARISON:  None. FINDINGS: Intrauterine gestational sac: Present with decidual reaction Yolk sac:  Present Embryo:  Not present Cardiac Activity: Not present MSD: 10 mm   5 w   5 d Subchorionic hemorrhage:  None visualized. Maternal uterus/adnexae: Normal appearance of the adnexa. Minimal free fluid. IMPRESSION: Early intrauterine gestational and yolk sac, no fetal pole, or cardiac activity yet visualized. Recommend follow-up quantitative B-HCG levels and follow-up US in 14 days to assess viability. This recommendation follows SRU consensus guidelines: Diagnostic Criteria for Nonviable Pregnancy Early in the First Trimester. Malva Limes Med 2013; 161:0960-45. Electronically Signed   By: Awilda Metro M.D.   On: 11/06/2017 18:36   US Ob Transvaginal  Result Date: 11/06/2017 CLINICAL DATA:  Bleeding and cramping. Beta HCG  11,244. Gestational age by last menstrual period 5 weeks and 3 days. EXAM: OBSTETRIC <14 WK [place] AND TRANSVAGINAL OB US TECHNIQUE: Both transabdominal and transvaginal ultrasound examinations were performed for complete evaluation of the gestation as well as the maternal uterus, adnexal regions, and pelvic cul-de-sac. Transvaginal technique was performed to assess early pregnancy. COMPARISON:  None. FINDINGS: Intrauterine gestational sac: Present with decidual reaction Yolk sac:  Present Embryo:  Not present Cardiac Activity: Not present MSD: 10 mm   5 w   5 d Subchorionic hemorrhage:  None visualized. Maternal uterus/adnexae: Normal appearance of the adnexa. Minimal free fluid. IMPRESSION: Early intrauterine gestational and yolk sac, no fetal pole, or cardiac activity yet visualized. Recommend follow-up quantitative B-HCG levels and follow-up US in 14 days to assess viability. This recommendation follows SRU consensus guidelines: Diagnostic Criteria for Nonviable Pregnancy Early in the First Trimester. Malva Limes Med 2013; 409:8119-14. Electronically Signed   By: Awilda Metro M.D.   On: 11/06/2017 18:36    MAU Management/MDM: Ordered labs and [place] and reviewed results. IUP noted on exam with GS and YS but no FP yet.  Will f/u with outpatient viability [place] in 7-10 days.  Will treat for BV with positive clue cells and malodorous discharge.  Return to MAU with worsening symptoms.  Tylenol, heat for pain.  Pt discharged with strict miscarriage/bleeding precautions.  ASSESSMENT 1. Abdominal pain during pregnancy in first trimester   2. Vaginal bleeding in pregnancy, first trimester   3. BV (bacterial vaginosis)     PLAN Discharge home  Allergies as of 11/06/2017      Reactions   Prednisone Nausea And Vomiting      Medication List    TAKE these medications   cetirizine 10 MG tablet Commonly known as:  ZYRTEC Take 10 mg daily as needed by mouth for allergies or rhinitis.   CONCEPT OB 130-92.4-1 MG  Caps Take 1 tablet by mouth daily.   loratadine 10 MG tablet Commonly known as:  CLARITIN Take 1 tablet (10 mg total) by mouth daily. What changed:    when to take this  reasons to take this   metroNIDAZOLE 500 MG tablet Commonly known as:  FLAGYL Take 1 tablet (500 mg total) by mouth 2 (two) times daily.   albuterol (2.5 MG/3ML) 0.083% nebulizer solution Commonly known as:  PROVENTIL Take 3 mLs (2.5 mg total) by nebulization every 4 (four) hours as needed for wheezing or shortness of breath.   PROAIR HFA 108 (90 Base) MCG/ACT inhaler Generic drug:  albuterol Inhale 2 puffs into the lungs every 4 (four) hours as needed for wheezing or shortness of breath. For shortness of breath/wheezing   promethazine 25 MG tablet Commonly known as:  PHENERGAN Take 1 tablet (25 mg total) by mouth every 6 (six) hours as needed for nausea.   promethazine 25 MG tablet Commonly known as:  PHENERGAN Take 1 tablet (25 mg total) by mouth every 6 (six) hours as needed.      Follow-up Information    [facility] [place] [facility] Follow up.   Specialty:  Radiology Why:  [place] will call you to schedule in 7-10 days.  Return to MAU as needed for worsening symptoms. Contact information: [address] [id number] mc [place] [address] [phone]          Sharen Counter Certified Nurse-Midwife 11/06/2017  7:37 PM

## 2017-11-06 NOTE — MAU Note (Signed)
Pt presents with c/o lower abdominal cramping that began today and brown vaginal discharge that began 3 days ago.  Reports discharge doesn't have odor.

## 2017-11-06 NOTE — MAU Note (Signed)
This week, been noticing, brown d/c.  Today she started cramping really really bad.

## 2017-11-07 LAB — GC/CHLAMYDIA PROBE AMP (~~LOC~~) NOT AT ARMC
Chlamydia: NEGATIVE
NEISSERIA GONORRHEA: NEGATIVE

## 2017-11-07 LAB — HIV ANTIBODY (ROUTINE TESTING W REFLEX): HIV SCREEN 4TH GENERATION: NONREACTIVE

## 2017-11-13 ENCOUNTER — Other Ambulatory Visit: Payer: Self-pay | Admitting: General Practice

## 2017-11-13 DIAGNOSIS — Z331 Pregnant state, incidental: Secondary | ICD-10-CM

## 2017-11-13 MED ORDER — PRENATAL VITAMINS 0.8 MG PO TABS: 1.0000 | ORAL_TABLET | Freq: Every day | ORAL | 12 refills | Status: DC

## 2017-11-15 ENCOUNTER — Encounter: Payer: Self-pay | Admitting: Obstetrics and Gynecology

## 2017-11-15 ENCOUNTER — Encounter: Payer: Self-pay | Admitting: General Practice

## 2017-11-15 ENCOUNTER — Ambulatory Visit (HOSPITAL_COMMUNITY)
Admission: RE | Admit: 2017-11-15 | Discharge: 2017-11-15 | Disposition: A | Discharge status: Home / Self Care | Payer: Medicaid Other | Source: Ambulatory Visit | Attending: Advanced Practice Midwife | Admitting: Advanced Practice Midwife

## 2017-11-15 ENCOUNTER — Ambulatory Visit: Payer: BLUE CROSS/BLUE SHIELD | Admitting: General Practice

## 2017-11-15 DIAGNOSIS — O209 Hemorrhage in early pregnancy, unspecified: Secondary | ICD-10-CM | POA: Diagnosis not present

## 2017-11-15 DIAGNOSIS — Z3A01 Less than 8 weeks gestation of pregnancy: Secondary | ICD-10-CM | POA: Insufficient documentation

## 2017-11-15 DIAGNOSIS — O23591 Infection of other part of genital tract in pregnancy, first trimester: Secondary | ICD-10-CM | POA: Insufficient documentation

## 2017-11-15 DIAGNOSIS — O26891 Other specified pregnancy related conditions, first trimester: Secondary | ICD-10-CM | POA: Insufficient documentation

## 2017-11-15 DIAGNOSIS — Z712 Person consulting for explanation of examination or test findings: Secondary | ICD-10-CM

## 2017-11-15 DIAGNOSIS — R109 Unspecified abdominal pain: Secondary | ICD-10-CM | POA: Insufficient documentation

## 2017-11-15 DIAGNOSIS — B9689 Other specified bacterial agents as the cause of diseases classified elsewhere: Secondary | ICD-10-CM | POA: Diagnosis not present

## 2017-11-15 DIAGNOSIS — N760 Acute vaginitis: Secondary | ICD-10-CM | POA: Diagnosis not present

## 2017-11-15 NOTE — Progress Notes (Signed)
Patient here for viability results today. Reviewed results with Dr Vergie LivingPickens who finds living IUP, patient should begin prenatal care. Informed patient of results, dating & provided pictures. Recommended she begin prenatal care. Patient verbalized understanding to all & had no questions

## 2017-11-25 LAB — OB RESULTS CONSOLE RUBELLA ANTIBODY, IGM: RUBELLA: NON-IMMUNE/NOT IMMUNE

## 2017-11-25 LAB — OB RESULTS CONSOLE GC/CHLAMYDIA
CHLAMYDIA, DNA PROBE: NEGATIVE
Gonorrhea: NEGATIVE

## 2017-11-25 LAB — OB RESULTS CONSOLE ABO/RH: RH TYPE: POSITIVE

## 2017-11-25 LAB — OB RESULTS CONSOLE RPR: RPR: NONREACTIVE

## 2017-11-25 LAB — OB RESULTS CONSOLE ANTIBODY SCREEN: ANTIBODY SCREEN: NEGATIVE

## 2017-11-25 LAB — OB RESULTS CONSOLE HEPATITIS B SURFACE ANTIGEN: HEP B S AG: NEGATIVE

## 2017-11-25 LAB — OB RESULTS CONSOLE HIV ANTIBODY (ROUTINE TESTING): HIV: NONREACTIVE

## 2017-11-26 ENCOUNTER — Other Ambulatory Visit: Payer: Self-pay | Admitting: Advanced Practice Midwife

## 2017-11-26 DIAGNOSIS — R55 Syncope and collapse: Secondary | ICD-10-CM

## 2017-11-26 DIAGNOSIS — R110 Nausea: Secondary | ICD-10-CM

## 2017-11-26 DIAGNOSIS — R000 Tachycardia, unspecified: Secondary | ICD-10-CM

## 2017-11-26 DIAGNOSIS — R42 Dizziness and giddiness: Secondary | ICD-10-CM

## 2017-11-26 DIAGNOSIS — O26899 Other specified pregnancy related conditions, unspecified trimester: Secondary | ICD-10-CM

## 2017-11-26 DIAGNOSIS — Z3201 Encounter for pregnancy test, result positive: Secondary | ICD-10-CM

## 2018-01-07 ENCOUNTER — Inpatient Hospital Stay (HOSPITAL_COMMUNITY)
Admission: AD | Admit: 2018-01-07 | Discharge: 2018-01-08 | Disposition: A | Discharge status: Home / Self Care | Payer: Medicaid Other | Source: Ambulatory Visit | Attending: Obstetrics and Gynecology | Admitting: Obstetrics and Gynecology

## 2018-01-07 DIAGNOSIS — O99332 Smoking (tobacco) complicating pregnancy, second trimester: Secondary | ICD-10-CM | POA: Diagnosis not present

## 2018-01-07 DIAGNOSIS — O26892 Other specified pregnancy related conditions, second trimester: Secondary | ICD-10-CM | POA: Insufficient documentation

## 2018-01-07 DIAGNOSIS — B9689 Other specified bacterial agents as the cause of diseases classified elsewhere: Secondary | ICD-10-CM | POA: Insufficient documentation

## 2018-01-07 DIAGNOSIS — O23592 Infection of other part of genital tract in pregnancy, second trimester: Secondary | ICD-10-CM | POA: Insufficient documentation

## 2018-01-07 DIAGNOSIS — G43909 Migraine, unspecified, not intractable, without status migrainosus: Secondary | ICD-10-CM | POA: Insufficient documentation

## 2018-01-07 DIAGNOSIS — F17210 Nicotine dependence, cigarettes, uncomplicated: Secondary | ICD-10-CM | POA: Insufficient documentation

## 2018-01-07 DIAGNOSIS — Z3A14 14 weeks gestation of pregnancy: Secondary | ICD-10-CM | POA: Insufficient documentation

## 2018-01-07 DIAGNOSIS — G43109 Migraine with aura, not intractable, without status migrainosus: Secondary | ICD-10-CM

## 2018-01-07 MED ORDER — METOCLOPRAMIDE HCL 5 MG/ML IJ SOLN
10.0000 mg | Freq: Once | INTRAMUSCULAR | Status: AC
  Administered 2018-01-08: 10 mg via INTRAVENOUS
  Filled 2018-01-07: qty 2

## 2018-01-07 MED ORDER — DIPHENHYDRAMINE HCL 50 MG/ML IJ SOLN
25.0000 mg | Freq: Once | INTRAMUSCULAR | Status: AC
  Administered 2018-01-08: 25 mg via INTRAVENOUS
  Filled 2018-01-07: qty 1

## 2018-01-07 MED ORDER — SODIUM CHLORIDE 0.9 % IV SOLN
INTRAVENOUS | Status: DC
  Administered 2018-01-08: 01:00:00 via INTRAVENOUS

## 2018-01-07 NOTE — MAU Note (Signed)
Pt here with c/o migraine headache (has hx) and cramping for last 2 days. Denies any bleeding or leaking. Had med prescribed for HA but not working.

## 2018-01-08 ENCOUNTER — Inpatient Hospital Stay (HOSPITAL_COMMUNITY): Payer: Medicaid Other

## 2018-01-08 LAB — URINALYSIS, ROUTINE W REFLEX MICROSCOPIC
Bilirubin Urine: NEGATIVE
Glucose, UA: NEGATIVE mg/dL
Hgb urine dipstick: NEGATIVE
Ketones, ur: NEGATIVE mg/dL
Leukocytes, UA: NEGATIVE
Nitrite: NEGATIVE
Protein, ur: NEGATIVE mg/dL
Specific Gravity, Urine: 1.03 (ref 1.005–1.030)
pH: 5 (ref 5.0–8.0)

## 2018-01-08 LAB — WET PREP, GENITAL
Sperm: NONE SEEN
Trich, Wet Prep: NONE SEEN
Yeast Wet Prep HPF POC: NONE SEEN

## 2018-01-08 MED ORDER — BUTALBITAL-APAP-CAFFEINE 50-325-40 MG PO TABS
2.0000 | ORAL_TABLET | Freq: Once | ORAL | Status: DC
  Filled 2018-01-08: qty 2

## 2018-01-08 NOTE — MAU Provider Note (Addendum)
History     CSN: [id number]  Arrival date and time: 01/07/18 2314   None     Chief Complaint  Patient presents with  . Headache  . Abdominal Pain   G4P2012 @ [redacted]w[redacted]d Pt here with c/o migraine headache (has hx) and cramping for last 2 days. Denies any bleeding or leaking. Had med prescribed for HA but not working     Past Medical History:  Diagnosis Date  . Asthma   . Chlamydia 2008  . Chronic pain   . Constipation, chronic 11/13/11  . Depression    postpartum  . Gonorrhea   . History of chlamydia 03/07/2012  . History of trichomoniasis 03/07/2012  . Hx: UTI (urinary tract infection)    Frequently  . Irregular periods/menstrual cycles   . Migraines    Migraines  . Obese   . Trichomonas 2008  . Vaginal delivery--shoulder dystocia 07/10/2012    Past Surgical History:  Procedure Laterality Date  . INDUCED ABORTION    . WISDOM TOOTH EXTRACTION      Family History  Problem Relation Age of Onset  . Hypertension Mother   . Heart disease Mother   . Diabetes Mother   . Stroke Mother   . Hyperlipidemia Mother   . Hypertension Sister   . Thrombophlebitis Sister        clotting issue  . Depression Sister   . Heart disease Maternal Grandfather   . Drug abuse Father   . Alcohol abuse Maternal Uncle   . Kidney disease Maternal Uncle        failure  . Rheum arthritis Sister        arthritis vs fibromyalgia  . Heart murmur Sister   . Fibromyalgia Sister   . Depression Sister   . Hypertension Sister   . Anesthesia problems Neg Hx   . Hypotension Neg Hx   . Malignant hyperthermia Neg Hx   . Pseudochol deficiency Neg Hx     Social History   Tobacco Use  . Smoking status: Current Every Day Smoker    Packs/day: 0.25    Types: Cigarettes  . Smokeless tobacco: Never Used  . Tobacco comment: Black and Mild  Substance Use Topics  . Alcohol use: Yes    Comment: occasional  . Drug use: No    Allergies:  Allergies  Allergen Reactions  . Prednisone Nausea And  Vomiting    Medications Prior to Admission  Medication Sig Dispense Refill Last Dose  . Prenatal Multivit-Min-Fe-FA (PRENATAL VITAMINS) 0.8 MG tablet Take 1 tablet by mouth daily. 30 tablet 12 Past Week at Unknown time  . promethazine (PHENERGAN) 25 MG tablet Take 1 tablet (25 mg total) by mouth every 6 (six) hours as needed. 30 tablet 1 01/06/2018 at Unknown time  . albuterol (PROAIR HFA) 108 (90 BASE) MCG/ACT inhaler Inhale 2 puffs into the lungs every 4 (four) hours as needed for wheezing or shortness of breath. For shortness of breath/wheezing   PRN at PRN  . albuterol (PROVENTIL) (2.5 MG/3ML) 0.083% nebulizer solution Take 3 mLs (2.5 mg total) by nebulization every 4 (four) hours as needed for wheezing or shortness of breath. 30 vial 0 Unknown at Unknown time  . cetirizine (ZYRTEC) 10 MG tablet Take 10 mg daily as needed by mouth for allergies or rhinitis.    Unknown at Unknown time  . loratadine (CLARITIN) 10 MG tablet Take 1 tablet (10 mg total) by mouth daily. (Patient taking differently: Take 10 mg daily as needed by  mouth for allergies or rhinitis. ) 20 tablet 0 Unknown at Unknown time  . metroNIDAZOLE (FLAGYL) 500 MG tablet Take 1 tablet (500 mg total) by mouth 2 (two) times daily. 14 tablet 0 Unknown at Unknown time  . promethazine (PHENERGAN) 25 MG tablet Take 1 tablet (25 mg total) by mouth every 6 (six) hours as needed for nausea. 12 tablet 0     Review of Systems  Gastrointestinal: Positive for abdominal pain and nausea.  Neurological: Positive for headaches.  All other systems reviewed and are negative.  Physical Exam   Blood pressure 118/71, pulse 94, temperature 98.7 F (37.1 C), temperature source Oral, resp. rate 18, height 5\' 8"  (1.727 m), weight 230 lb (104.3 kg), last menstrual period 09/29/2017, SpO2 100 %.  Physical Exam  Nursing note and vitals reviewed. Constitutional: She is oriented to person, place, and time. She appears well-developed and well-nourished.   HENT:  Head: Normocephalic and atraumatic.  Neck: Normal range of motion.  Cardiovascular: Normal rate.  Respiratory: Effort normal.  GI: Soft.  Genitourinary: Vagina normal.  Musculoskeletal: Normal range of motion.  Neurological: She is alert and oriented to person, place, and time.  Skin: Skin is warm and dry.  Psychiatric: She has a normal mood and affect. Her behavior is normal.   Results for orders placed or performed during the hospital encounter of 01/07/18 (from the past 24 hour(s))  Urinalysis, Routine w reflex microscopic     Status: None   Collection Time: 01/07/18 11:45 PM  Result Value Ref Range   Color, Urine YELLOW YELLOW   APPearance CLEAR CLEAR   Specific Gravity, Urine 1.030 1.005 - 1.030   pH 5.0 5.0 - 8.0   Glucose, UA NEGATIVE NEGATIVE mg/dL   Hgb urine dipstick NEGATIVE NEGATIVE   Bilirubin Urine NEGATIVE NEGATIVE   Ketones, ur NEGATIVE NEGATIVE mg/dL   Protein, ur NEGATIVE NEGATIVE mg/dL   Nitrite NEGATIVE NEGATIVE   Leukocytes, UA NEGATIVE NEGATIVE  Wet prep, genital     Status: Abnormal   Collection Time: 01/08/18 12:02 AM  Result Value Ref Range   Yeast Wet Prep HPF POC NONE SEEN NONE SEEN   Trich, Wet Prep NONE SEEN NONE SEEN   Clue Cells Wet Prep HPF POC PRESENT (A) NONE SEEN   WBC, Wet Prep HPF POC MODERATE (A) NONE SEEN   Sperm NONE SEEN    MAU Course  Procedures  MDM Headache cocktail and IVF given and pt states slight improvement. Nausea gone. Clue cells noted on wet prep . RX for flagyl given. Pt refuses fioricet and states she wants a work note for tomorrow. Consider getting neurology consult. Next appointment 4-10/19  Assessment and Plan  Migraine without auro BV Flagyl; RX Discharge to office Follow up in office at next regular scheduled appointment  Mileah Hemmer A Kitt Minardi CNM 01/08/2018, 12:10 AM

## 2018-01-23 ENCOUNTER — Other Ambulatory Visit (HOSPITAL_COMMUNITY): Payer: Self-pay | Admitting: Obstetrics & Gynecology

## 2018-01-23 DIAGNOSIS — Z3689 Encounter for other specified antenatal screening: Secondary | ICD-10-CM

## 2018-02-10 ENCOUNTER — Other Ambulatory Visit (HOSPITAL_COMMUNITY): Payer: Self-pay | Admitting: Obstetrics & Gynecology

## 2018-02-10 ENCOUNTER — Inpatient Hospital Stay (HOSPITAL_COMMUNITY)
Admission: AD | Admit: 2018-02-10 | Discharge: 2018-02-11 | Disposition: A | Discharge status: Home / Self Care | Payer: Medicaid Other | Source: Ambulatory Visit | Attending: Obstetrics & Gynecology | Admitting: Obstetrics & Gynecology

## 2018-02-10 ENCOUNTER — Encounter (HOSPITAL_COMMUNITY): Payer: Self-pay | Admitting: *Deleted

## 2018-02-10 ENCOUNTER — Ambulatory Visit (HOSPITAL_COMMUNITY)
Admission: RE | Admit: 2018-02-10 | Discharge: 2018-02-10 | Disposition: A | Discharge status: Home / Self Care | Payer: Medicaid Other | Source: Ambulatory Visit | Attending: Obstetrics & Gynecology | Admitting: Obstetrics & Gynecology

## 2018-02-10 DIAGNOSIS — O99212 Obesity complicating pregnancy, second trimester: Secondary | ICD-10-CM | POA: Diagnosis present

## 2018-02-10 DIAGNOSIS — Z3A19 19 weeks gestation of pregnancy: Secondary | ICD-10-CM | POA: Insufficient documentation

## 2018-02-10 DIAGNOSIS — M549 Dorsalgia, unspecified: Secondary | ICD-10-CM | POA: Diagnosis not present

## 2018-02-10 DIAGNOSIS — O26892 Other specified pregnancy related conditions, second trimester: Secondary | ICD-10-CM | POA: Insufficient documentation

## 2018-02-10 DIAGNOSIS — Z3689 Encounter for other specified antenatal screening: Secondary | ICD-10-CM

## 2018-02-10 DIAGNOSIS — F17210 Nicotine dependence, cigarettes, uncomplicated: Secondary | ICD-10-CM | POA: Diagnosis not present

## 2018-02-10 DIAGNOSIS — Z363 Encounter for antenatal screening for malformations: Secondary | ICD-10-CM | POA: Diagnosis not present

## 2018-02-10 DIAGNOSIS — O99332 Smoking (tobacco) complicating pregnancy, second trimester: Secondary | ICD-10-CM | POA: Diagnosis not present

## 2018-02-10 DIAGNOSIS — R109 Unspecified abdominal pain: Secondary | ICD-10-CM | POA: Diagnosis present

## 2018-02-10 LAB — URINALYSIS, ROUTINE W REFLEX MICROSCOPIC
Bacteria, UA: NONE SEEN
Bilirubin Urine: NEGATIVE
Glucose, UA: NEGATIVE mg/dL
Hgb urine dipstick: NEGATIVE
Ketones, ur: 5 mg/dL — AB
Leukocytes, UA: NEGATIVE
Nitrite: NEGATIVE
Protein, ur: 30 mg/dL — AB
Specific Gravity, Urine: 1.035 — ABNORMAL HIGH (ref 1.005–1.030)
pH: 5 (ref 5.0–8.0)
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
## 2018-02-11 MED ORDER — CYCLOBENZAPRINE HCL 5 MG PO TABS: 5.0000 mg | ORAL_TABLET | Freq: Three times a day (TID) | ORAL | Status: DC | PRN

## 2018-02-11 MED ORDER — CYCLOBENZAPRINE HCL 5 MG PO TABS: 5.0000 mg | ORAL_TABLET | Freq: Three times a day (TID) | ORAL | 0 refills | Status: DC | PRN
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
## 2018-03-08 ENCOUNTER — Inpatient Hospital Stay (HOSPITAL_COMMUNITY)
Admission: AD | Admit: 2018-03-08 | Discharge: 2018-03-08 | Disposition: A | Discharge status: Home / Self Care | Payer: Medicaid Other | Source: Ambulatory Visit | Attending: Obstetrics and Gynecology | Admitting: Obstetrics and Gynecology

## 2018-03-08 ENCOUNTER — Encounter (HOSPITAL_COMMUNITY): Payer: Self-pay

## 2018-03-08 DIAGNOSIS — F329 Major depressive disorder, single episode, unspecified: Secondary | ICD-10-CM | POA: Diagnosis not present

## 2018-03-08 DIAGNOSIS — Z841 Family history of disorders of kidney and ureter: Secondary | ICD-10-CM | POA: Insufficient documentation

## 2018-03-08 DIAGNOSIS — Z3A22 22 weeks gestation of pregnancy: Secondary | ICD-10-CM | POA: Insufficient documentation

## 2018-03-08 DIAGNOSIS — F17210 Nicotine dependence, cigarettes, uncomplicated: Secondary | ICD-10-CM | POA: Insufficient documentation

## 2018-03-08 DIAGNOSIS — Z87440 Personal history of urinary (tract) infections: Secondary | ICD-10-CM | POA: Diagnosis not present

## 2018-03-08 DIAGNOSIS — O99212 Obesity complicating pregnancy, second trimester: Secondary | ICD-10-CM | POA: Insufficient documentation

## 2018-03-08 DIAGNOSIS — R55 Syncope and collapse: Secondary | ICD-10-CM | POA: Diagnosis not present

## 2018-03-08 DIAGNOSIS — O99012 Anemia complicating pregnancy, second trimester: Secondary | ICD-10-CM | POA: Insufficient documentation

## 2018-03-08 DIAGNOSIS — D649 Anemia, unspecified: Secondary | ICD-10-CM | POA: Diagnosis not present

## 2018-03-08 DIAGNOSIS — Z8249 Family history of ischemic heart disease and other diseases of the circulatory system: Secondary | ICD-10-CM | POA: Insufficient documentation

## 2018-03-08 DIAGNOSIS — R531 Weakness: Secondary | ICD-10-CM | POA: Insufficient documentation

## 2018-03-08 DIAGNOSIS — Z833 Family history of diabetes mellitus: Secondary | ICD-10-CM | POA: Insufficient documentation

## 2018-03-08 DIAGNOSIS — O99342 Other mental disorders complicating pregnancy, second trimester: Secondary | ICD-10-CM | POA: Insufficient documentation

## 2018-03-08 DIAGNOSIS — O99512 Diseases of the respiratory system complicating pregnancy, second trimester: Secondary | ICD-10-CM | POA: Insufficient documentation

## 2018-03-08 DIAGNOSIS — Z818 Family history of other mental and behavioral disorders: Secondary | ICD-10-CM | POA: Insufficient documentation

## 2018-03-08 DIAGNOSIS — Z9889 Other specified postprocedural states: Secondary | ICD-10-CM | POA: Diagnosis not present

## 2018-03-08 DIAGNOSIS — Z79899 Other long term (current) drug therapy: Secondary | ICD-10-CM | POA: Insufficient documentation

## 2018-03-08 DIAGNOSIS — Z8261 Family history of arthritis: Secondary | ICD-10-CM | POA: Diagnosis not present

## 2018-03-08 DIAGNOSIS — R42 Dizziness and giddiness: Secondary | ICD-10-CM

## 2018-03-08 DIAGNOSIS — J45909 Unspecified asthma, uncomplicated: Secondary | ICD-10-CM | POA: Insufficient documentation

## 2018-03-08 DIAGNOSIS — Z823 Family history of stroke: Secondary | ICD-10-CM | POA: Diagnosis not present

## 2018-03-08 DIAGNOSIS — O99352 Diseases of the nervous system complicating pregnancy, second trimester: Secondary | ICD-10-CM | POA: Insufficient documentation

## 2018-03-08 DIAGNOSIS — O99332 Smoking (tobacco) complicating pregnancy, second trimester: Secondary | ICD-10-CM | POA: Insufficient documentation

## 2018-03-08 DIAGNOSIS — Z888 Allergy status to other drugs, medicaments and biological substances status: Secondary | ICD-10-CM | POA: Insufficient documentation

## 2018-03-08 DIAGNOSIS — E669 Obesity, unspecified: Secondary | ICD-10-CM | POA: Diagnosis not present

## 2018-03-08 DIAGNOSIS — Z811 Family history of alcohol abuse and dependence: Secondary | ICD-10-CM | POA: Insufficient documentation

## 2018-03-08 DIAGNOSIS — O26892 Other specified pregnancy related conditions, second trimester: Secondary | ICD-10-CM | POA: Insufficient documentation

## 2018-03-08 DIAGNOSIS — G8929 Other chronic pain: Secondary | ICD-10-CM | POA: Diagnosis not present

## 2018-03-08 LAB — URINALYSIS, ROUTINE W REFLEX MICROSCOPIC
Bilirubin Urine: NEGATIVE
GLUCOSE, UA: NEGATIVE mg/dL
HGB URINE DIPSTICK: NEGATIVE
Ketones, ur: NEGATIVE mg/dL
Leukocytes, UA: NEGATIVE
Nitrite: NEGATIVE
PROTEIN: NEGATIVE mg/dL
SPECIFIC GRAVITY, URINE: 1.006 (ref 1.005–1.030)
pH: 7 (ref 5.0–8.0)

## 2018-03-08 LAB — CBC
HEMATOCRIT: 30.5 % — AB (ref 36.0–46.0)
HEMOGLOBIN: 9.9 g/dL — AB (ref 12.0–15.0)
MCH: 29.8 pg (ref 26.0–34.0)
MCHC: 32.5 g/dL (ref 30.0–36.0)
MCV: 91.9 fL (ref 78.0–100.0)
Platelets: 238 10*3/uL (ref 150–400)
RBC: 3.32 MIL/uL — ABNORMAL LOW (ref 3.87–5.11)
RDW: 13.2 % (ref 11.5–15.5)
WBC: 10 10*3/uL (ref 4.0–10.5)

## 2018-03-08 MED ORDER — FERROUS SULFATE 325 (65 FE) MG PO TABS: 325.0000 mg | ORAL_TABLET | Freq: Every day | ORAL | 3 refills | Status: DC

## 2018-03-08 MED ORDER — LACTATED RINGERS IV BOLUS
1000.0000 mL | Freq: Once | INTRAVENOUS | Status: AC
  Administered 2018-03-08: 1000 mL via INTRAVENOUS
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
## 2018-03-08 NOTE — MAU Provider Note (Addendum)
History    Patient Margaret Cline is a 27 y.o. [id number] At [redacted]w[redacted]d here with complaints of dizziness. She denies dysuria, vaginal discharge, vaginal bleeding or odor. She denies contractions.   CSN: [id number]  Arrival date and time: 03/08/18 1478   First Provider Initiated Contact with Patient 03/08/18 [phone]      Chief Complaint  Patient presents with  . Loss of Consciousness  . Dizziness   Dizziness  This is a new problem. The current episode started in the past 7 days. The problem occurs intermittently. The problem has been resolved. Associated symptoms include fatigue. Pertinent negatives include no abdominal pain, congestion, fever, headaches or vomiting. Nothing aggravates the symptoms. She has tried nothing for the symptoms.   Patient has felt tired and weak for the past week, she told her OB provider about it at her prenatal visit last week. She was told it was "a virus". Yesterday she came home from work and then went to the grocery store at 6:30 pm; while in the store her leg hurt and she became dizzy and anxious. She said she started to faint but an employee helped her to the chair. She went home with her mom, and now she has felt dizzy all night.   She is tearful; she doesn't like it when she has these dizzy spells because it scares her. She says she has not been eating as much as normal.   OB History    Gravida  4   Para  2   Term  2   Preterm  0   AB  1   Living  2     SAB  0   TAB  1   Ectopic  0   Multiple  0   Live Births  2           Past Medical History:  Diagnosis Date  . Asthma   . Chlamydia 2008  . Chronic pain   . Constipation, chronic 11/13/11  . Depression    postpartum  . Gonorrhea   . History of chlamydia 03/07/2012  . History of trichomoniasis 03/07/2012  . Hx: UTI (urinary tract infection)    Frequently  . Irregular periods/menstrual cycles   . Migraines    Migraines  . Obese   . Trichomonas 2008  . Vaginal delivery--shoulder  dystocia 07/10/2012    Past Surgical History:  Procedure Laterality Date  . INDUCED ABORTION    . WISDOM TOOTH EXTRACTION      Family History  Problem Relation Age of Onset  . Hypertension Mother   . Heart disease Mother   . Diabetes Mother   . Stroke Mother   . Hyperlipidemia Mother   . Hypertension Sister   . Thrombophlebitis Sister        clotting issue  . Depression Sister   . Heart disease Maternal Grandfather   . Drug abuse Father   . Alcohol abuse Maternal Uncle   . Kidney disease Maternal Uncle        failure  . Rheum arthritis Sister        arthritis vs fibromyalgia  . Heart murmur Sister   . Fibromyalgia Sister   . Depression Sister   . Hypertension Sister   . Anesthesia problems Neg Hx   . Hypotension Neg Hx   . Malignant hyperthermia Neg Hx   . Pseudochol deficiency Neg Hx     Social History   Tobacco Use  . Smoking status: Former Smoker  Packs/day: 0.25    Types: Cigarettes  . Smokeless tobacco: Never Used  . Tobacco comment: not since pregnancy  Substance Use Topics  . Alcohol use: Not Currently    Comment: occasional  . Drug use: No    Allergies:  Allergies  Allergen Reactions  . Prednisone Nausea And Vomiting    Medications Prior to Admission  Medication Sig Dispense Refill Last Dose  . albuterol (PROAIR HFA) 108 (90 BASE) MCG/ACT inhaler Inhale 2 puffs into the lungs every 4 (four) hours as needed for wheezing or shortness of breath. For shortness of breath/wheezing   PRN at PRN  . albuterol (PROVENTIL) (2.5 MG/3ML) 0.083% nebulizer solution Take 3 mLs (2.5 mg total) by nebulization every 4 (four) hours as needed for wheezing or shortness of breath. 30 vial 0 Past Week at Unknown time  . cetirizine (ZYRTEC) 10 MG tablet Take 10 mg daily as needed by mouth for allergies or rhinitis.    Past Week at Unknown time  . cyclobenzaprine (FLEXERIL) 5 MG tablet Take 1 tablet (5 mg total) by mouth 3 (three) times daily as needed for muscle spasms.  12 tablet 0   . loratadine (CLARITIN) 10 MG tablet Take 1 tablet (10 mg total) by mouth daily. (Patient taking differently: Take 10 mg daily as needed by mouth for allergies or rhinitis. ) 20 tablet 0 Unknown at Unknown time  . Prenatal Multivit-Min-Fe-FA (PRENATAL VITAMINS) 0.8 MG tablet Take 1 tablet by mouth daily. 30 tablet 12 Past Week at Unknown time    Review of Systems  Constitutional: Positive for fatigue. Negative for fever.  HENT: Negative for congestion.   Respiratory: Negative.   Gastrointestinal: Negative for abdominal pain and vomiting.  Genitourinary: Negative.   Neurological: Positive for dizziness. Negative for headaches.   Physical Exam   Blood pressure (!) 107/53, pulse 77, temperature 98.1 F (36.7 C), temperature source Oral, resp. rate 18, last menstrual period 09/29/2017.  Physical Exam  Constitutional: She is oriented to person, place, and time. She appears well-developed.  HENT:  Head: Normocephalic.  Neck: Normal range of motion.  Respiratory: Effort normal.  GI: Soft.  Neurological: She is alert and oriented to person, place, and time.  Skin: Skin is warm and dry.    MAU Course  Procedures  MDM UA: negative for dehydration -EKG was normal; BP on the low side.  -2 Liters of fluid.  -CBC shows Hgb of 9.9.  FH is 135 Patient's lab, physical presentation, EKG and history discussed with Dr. Richardson Dopp, who agrees patient is stable for discharge.   Assessment and Plan   1. Dizziness   2. Anemia, unspecified type    2. Patient stable for discharge with recommendations to avoid heat, rest often, eat more protein and take iron pills as prescribed. Increase water intake.   3. Patient may be out of work until Tuesday, per Dr. Richardson Dopp.   Luna Kitchens CNM  Charlesetta Garibaldi Kooistra 03/08/2018, 9:55 AM

## 2018-03-08 NOTE — Discharge Instructions (Signed)

## 2018-05-01 LAB — OB RESULTS CONSOLE GBS: GBS: NEGATIVE

## 2018-05-14 ENCOUNTER — Encounter (HOSPITAL_COMMUNITY): Payer: Self-pay

## 2018-05-14 ENCOUNTER — Inpatient Hospital Stay (HOSPITAL_COMMUNITY)
Admission: AD | Admit: 2018-05-14 | Discharge: 2018-05-14 | Disposition: A | Discharge status: Home / Self Care | Payer: Medicaid Other | Source: Ambulatory Visit | Attending: Obstetrics and Gynecology | Admitting: Obstetrics and Gynecology

## 2018-05-14 ENCOUNTER — Other Ambulatory Visit: Payer: Self-pay

## 2018-05-14 DIAGNOSIS — Z3A32 32 weeks gestation of pregnancy: Secondary | ICD-10-CM | POA: Diagnosis not present

## 2018-05-14 DIAGNOSIS — Z87891 Personal history of nicotine dependence: Secondary | ICD-10-CM | POA: Diagnosis not present

## 2018-05-14 DIAGNOSIS — R197 Diarrhea, unspecified: Secondary | ICD-10-CM | POA: Insufficient documentation

## 2018-05-14 DIAGNOSIS — O212 Late vomiting of pregnancy: Secondary | ICD-10-CM | POA: Insufficient documentation

## 2018-05-14 DIAGNOSIS — R112 Nausea with vomiting, unspecified: Secondary | ICD-10-CM

## 2018-05-14 DIAGNOSIS — O9989 Other specified diseases and conditions complicating pregnancy, childbirth and the puerperium: Secondary | ICD-10-CM | POA: Diagnosis not present

## 2018-05-14 HISTORY — DX: Anemia, unspecified: D64.9

## 2018-05-14 LAB — URINALYSIS, ROUTINE W REFLEX MICROSCOPIC
Bilirubin Urine: NEGATIVE
GLUCOSE, UA: NEGATIVE mg/dL
HGB URINE DIPSTICK: NEGATIVE
KETONES UR: 5 mg/dL — AB
LEUKOCYTES UA: NEGATIVE
Nitrite: NEGATIVE
PH: 6 (ref 5.0–8.0)
PROTEIN: NEGATIVE mg/dL
Specific Gravity, Urine: 1.026 (ref 1.005–1.030)

## 2018-05-14 NOTE — MAU Provider Note (Signed)
Chief Complaint:  Emesis; Diarrhea; and Abdominal Pain   None    HPI: Margaret Cline is a 27 y.o. [id number] at 7525w3d who presents to maternity admissions reporting N/V/D x 2 daysnow. Here for nausea and vomiting x 3 today. ated part of a sandwich at 0900, then threw it up. Last time vomited was at 1215 leaving doc's office. Pt endorses diarrhea x 4 today , watery. Pt stated she was at work and started to not feel well, then went to the [facility] office for urgent visit. Per Dr Sallye OberKulwa pt was 1-1.5 cm at office and was worried about PTL. Pt has been dx with hyperemesis with current pregnancy. Taking reglan and diglaegis and phenergan and zofran which pt states is not helping and she has not been taking it. Runny and mucus. Last diarrea was at 0915. States only happens after eating. Denies contractions, currently but feels BH from time to time, leakage of fluid or vaginal bleeding. Good fetal movement.   Pregnancy Course:   Past Medical History:  Diagnosis Date  . Anemia    Fe x2  . Asthma   . Chlamydia 2008  . Chronic pain   . Constipation, chronic 11/13/11  . Depression    postpartum - no meds  . Gonorrhea   . History of chlamydia 03/07/2012  . History of trichomoniasis 03/07/2012  . Hx: UTI (urinary tract infection)    Frequently  . Irregular periods/menstrual cycles   . Migraines    Migraines - no med   . Obese   . Trichomonas 2008  . Vaginal delivery--shoulder dystocia 07/10/2012   OB History  Gravida Para Term Preterm AB Living  5 2 2  0 2 2  SAB TAB Ectopic Multiple Live Births  1 1 0 0 2    # Outcome Date GA Lbr Len/2nd Weight Sex Delivery Anes PTL Lv  5 Current           4 Term 07/09/12 2028w5d 12:28 / 00:19 3.674 kg (8 lb 1.6 oz) F Vag-Spont EPI  LIV  3 Term 05/2007 1158w4d 19:00 3.476 kg (7 lb 10.6 oz) M Vag-Spont EPI N LIV     Birth Comments: Nuchal Cord x2, NICU.(VL)  2 SAB           1 TAB     U    DEC   Past Surgical History:  Procedure Laterality Date  . INDUCED ABORTION    .  WISDOM TOOTH EXTRACTION     Family History  Problem Relation Age of Onset  . Hypertension Mother   . Heart disease Mother   . Diabetes Mother   . Stroke Mother   . Hyperlipidemia Mother   . Hypertension Sister   . Thrombophlebitis Sister        clotting issue  . Depression Sister   . Heart disease Maternal Grandfather   . Drug abuse Father   . Alcohol abuse Maternal Uncle   . Kidney disease Maternal Uncle        failure  . Rheum arthritis Sister        arthritis vs fibromyalgia  . Heart murmur Sister   . Fibromyalgia Sister   . Depression Sister   . Hypertension Sister   . Anesthesia problems Neg Hx   . Hypotension Neg Hx   . Malignant hyperthermia Neg Hx   . Pseudochol deficiency Neg Hx    Social History   Tobacco Use  . Smoking status: Former Smoker    Packs/day: 0.25  Types: Cigarettes  . Smokeless tobacco: Never Used  . Tobacco comment: not since pregnancy  Substance Use Topics  . Alcohol use: Not Currently    Comment: occasional  . Drug use: No   Allergies  Allergen Reactions  . Prednisone Nausea And Vomiting   No medications prior to admission.    I have reviewed patient's Past Medical Hx, Surgical Hx, Family Hx, Social Hx, medications and allergies.   ROS:  Review of Systems  All other systems reviewed and are negative.   Physical Exam   Patient Vitals for the past 24 hrs:  BP Temp Temp src Pulse Resp SpO2 Weight  05/14/18 1730 110/64 98.9 F (37.2 C) Oral 82 18 - -  05/14/18 1254 122/68 98.5 F (36.9 C) Oral 79 18 100 % 107.4 kg (236 lb 12 oz)   Constitutional: Well-developed, well-nourished female in no acute distress.  Cardiovascular: normal rate Respiratory: normal effort GI: Abd soft, non-tender, gravid appropriate for gestational age. Pos BS x 4 MS: Extremities nontender, no edema, normal ROM Neurologic: Alert and oriented x 4.  GU: Neg CVAT.  Pelvic: NEFG, physiologic discharge, no blood, cervix clean. Pelvic adequate for labor.  No CMT  Dilation: 1 Effacement (%): Thick Cervical Position: Posterior Station: Ballotable Exam by:: [employer] CNM  NST: FHR baseline 125 bpm, Variability: moderate, Accelerations:present, Decelerations:  Absent= Cat 1/Reactive UC:   irregular, every 1 in 20 mins minutes, mild to palpate and mild maternal perception  SVE:   Dilation: 1 Effacement (%): Thick Station: Ballotable Exam by:: [employer] CNM,   Labs: Results for orders placed or performed during the hospital encounter of 05/14/18 (from the past 24 hour(s))  Urinalysis, Routine w reflex microscopic     Status: Abnormal   Collection Time: 05/14/18  1:08 PM  Result Value Ref Range   Color, Urine YELLOW YELLOW   APPearance CLEAR CLEAR   Specific Gravity, Urine 1.026 1.005 - 1.030   pH 6.0 5.0 - 8.0   Glucose, UA NEGATIVE NEGATIVE mg/dL   Hgb urine dipstick NEGATIVE NEGATIVE   Bilirubin Urine NEGATIVE NEGATIVE   Ketones, ur 5 (A) NEGATIVE mg/dL   Protein, ur NEGATIVE NEGATIVE mg/dL   Nitrite NEGATIVE NEGATIVE   Leukocytes, UA NEGATIVE NEGATIVE    Imaging:  No results found.  MAU Course: Orders Placed This Encounter  Procedures  . Urinalysis, Routine w reflex microscopic  . Diet - low sodium heart healthy  . Increase activity slowly  . Call MD for:  . Call MD for:  temperature >100.4  . Call MD for:  persistant nausea and vomiting  . Call MD for:  severe uncontrolled pain  . Call MD for:  redness, tenderness, or signs of infection (pain, swelling, redness, odor or green/yellow discharge around incision site)  . Call MD for:  difficulty breathing, headache or visual disturbances  . Call MD for:  hives  . Call MD for:  persistant dizziness or light-headedness  . Call MD for:  extreme fatigue  . (HEART FAILURE PATIENTS) Call MD:  Anytime you have any of the following symptoms: 1) 3 pound weight gain in 24 hours or 5 pounds in 1 week 2) shortness of breath, with or without a dry hacking cough 3) swelling in the  hands, feet or stomach 4) if you have to sleep on extra pillows at night in order to breathe.   No orders of the defined types were placed in this encounter.  Assessment: Margaret Cline is a 27 y.o.  [id number] at [redacted]w[redacted]d , stable, cat 1, reactive nst, no cervical change, PO challenge tolerated well.  1. Nausea vomiting and diarrhea   2. [redacted] weeks gestation of pregnancy     Plan: Discharge home in stable condition.  Keep hydrated, increase water, take your zofran and reglan and eat protein every 2 hours to to decrease nausea.  Labor precautions and fetal kick counts Follow-up Information    [facility] Follow up in 1 week(s).   Specialty:  Obstetrics and Gynecology Contact information: [address] [place] [address] [place] [address] [phone]          Allergies as of 05/14/2018      Reactions   Prednisone Nausea And Vomiting      Medication List    TAKE these medications   cetirizine 10 MG tablet Commonly known as:  ZYRTEC Take 10 mg daily as needed by mouth for allergies or rhinitis.   cyclobenzaprine 5 MG tablet Commonly known as:  FLEXERIL Take 1 tablet (5 mg total) by mouth 3 (three) times daily as needed for muscle spasms.   ferrous sulfate 325 (65 FE) MG tablet Take 1 tablet (325 mg total) by mouth daily with breakfast.   loratadine 10 MG tablet Commonly known as:  CLARITIN Take 1 tablet (10 mg total) by mouth daily. What changed:    when to take this  reasons to take this   Prenatal Vitamins 0.8 MG tablet Take 1 tablet by mouth daily.   albuterol (2.5 MG/3ML) 0.083% nebulizer solution Commonly known as:  PROVENTIL Take 3 mLs (2.5 mg total) by nebulization every 4 (four) hours as needed for wheezing or shortness of breath.   PROAIR HFA 108 (90 Base) MCG/ACT inhaler Generic drug:  albuterol Inhale 2 puffs into the lungs every 4 (four) hours as needed for wheezing or shortness of breath. For shortness of  breath/wheezing       [facility] NP-C, CNM Dale Avant, [place] 05/14/2018 10:44 PM

## 2018-05-14 NOTE — Progress Notes (Addendum)
G4P2 @ 32.[redacted] wksga. Here for nausea and vomiting. ated part of a sandwich at 0900. Last time vomited was at 1215 leaving doc's office. Denies LOF or bleeding. +FM.  dx with hyperemesis with current pregnancy. Taking reglan and diglaegis and phenergan which pt states is not helping.    Vomiting and diarrhea since yesterday. Runny and mucus. Last diarrea was at 0915. States only happens after eating  Up to bathroom and voided.   EFM applied.   BP 122/68 (BP Location: Right Arm)   Pulse 79   Temp 98.5 F (36.9 C) (Oral)   Resp 18   Wt 236 lb 12 oz (107.4 kg)   LMP 09/29/2017 (Exact Date)   SpO2 100%   BMI 36.53 kg/m    1426: [facility] notified. Report given. Stated will come in to evaluate pt.   1445: Provider at bs assessing. Bs commode provided. Pt voided w/o problems  SVE 1/thick/high  1500: Big jug of water given with crackers and gingerale. Denies N/V/D at this time  1514: checked on pt. Pt tolerating fluid at this time.   1541: checked on pt again. Ate crackers and soda. Able to keep it in.   1630: provider at bs reassessing. Provider informed pt want to go home and declines medications while at MAU.   1702: Provider notified for d/c orders. Will be putting them in.  1726: D/c instructions given with pt understanding.

## 2018-05-14 NOTE — MAU Note (Signed)
Sent from dr's office. Past couple days has had n/v/d. cx now 1.5-2cm.  Has been cramping

## 2018-06-18 ENCOUNTER — Other Ambulatory Visit: Payer: Self-pay

## 2018-06-18 ENCOUNTER — Inpatient Hospital Stay (HOSPITAL_COMMUNITY)
Admission: AD | Admit: 2018-06-18 | Discharge: 2018-06-18 | Disposition: A | Discharge status: Home / Self Care | Payer: Medicaid Other | Source: Ambulatory Visit | Attending: Obstetrics and Gynecology | Admitting: Obstetrics and Gynecology

## 2018-06-18 ENCOUNTER — Encounter (HOSPITAL_COMMUNITY): Payer: Self-pay | Admitting: *Deleted

## 2018-06-18 DIAGNOSIS — Z3A00 Weeks of gestation of pregnancy not specified: Secondary | ICD-10-CM | POA: Diagnosis not present

## 2018-06-18 DIAGNOSIS — O479 False labor, unspecified: Secondary | ICD-10-CM | POA: Diagnosis not present

## 2018-06-18 LAB — POCT FERN TEST: POCT Fern Test: NEGATIVE

## 2018-06-18 LAB — AMNISURE RUPTURE OF MEMBRANE (ROM) NOT AT ARMC: Amnisure ROM: NEGATIVE

## 2018-06-18 NOTE — MAU Note (Signed)
At 1430, felt something come out, went to the bathroom, noted some wetness, small amt of clear fluid continues, when she sits it kind of squishes out. No bleeding, some cramping.

## 2018-06-18 NOTE — Discharge Instructions (Signed)
Braxton Hicks Contractions °Contractions of the uterus can occur throughout pregnancy, but they are not always a sign that you are in labor. You may have practice contractions called Braxton Hicks contractions. These false labor contractions are sometimes confused with true labor. °What are Braxton Hicks contractions? °Braxton Hicks contractions are tightening movements that occur in the muscles of the uterus before labor. Unlike true labor contractions, these contractions do not result in opening (dilation) and thinning of the cervix. Toward the end of pregnancy (32-34 weeks), Braxton Hicks contractions can happen more often and may become stronger. These contractions are sometimes difficult to tell apart from true labor because they can be very uncomfortable. You should not feel embarrassed if you go to the hospital with false labor. °Sometimes, the only way to tell if you are in true labor is for your health care provider to look for changes in the cervix. The health care provider will do a physical exam and may monitor your contractions. If you are not in true labor, the exam should show that your cervix is not dilating and your water has not broken. °If there are other health problems associated with your pregnancy, it is completely safe for you to be sent home with false labor. You may continue to have Braxton Hicks contractions until you go into true labor. °How to tell the difference between true labor and false labor °True labor °· Contractions last 30-70 seconds. °· Contractions become very regular. °· Discomfort is usually felt in the top of the uterus, and it spreads to the lower abdomen and low back. °· Contractions do not go away with walking. °· Contractions usually become more intense and increase in frequency. °· The cervix dilates and gets thinner. °False labor °· Contractions are usually shorter and not as strong as true labor contractions. °· Contractions are usually irregular. °· Contractions  are often felt in the front of the lower abdomen and in the groin. °· Contractions may go away when you walk around or change positions while lying down. °· Contractions get weaker and are shorter-lasting as time goes on. °· The cervix usually does not dilate or become thin. °Follow these instructions at home: °· Take over-the-counter and prescription medicines only as told by your health care provider. °· Keep up with your usual exercises and follow other instructions from your health care provider. °· Eat and drink lightly if you think you are going into labor. °· If Braxton Hicks contractions are making you uncomfortable: °? Change your position from lying down or resting to walking, or change from walking to resting. °? Sit and rest in a tub of warm water. °? Drink enough fluid to keep your urine pale yellow. Dehydration may cause these contractions. °? Do slow and deep breathing several times an hour. °· Keep all follow-up prenatal visits as told by your health care provider. This is important. °Contact a health care provider if: °· You have a fever. °· You have continuous pain in your abdomen. °Get help right away if: °· Your contractions become stronger, more regular, and closer together. °· You have fluid leaking or gushing from your vagina. °· You pass blood-tinged mucus (bloody show). °· You have bleeding from your vagina. °· You have low back pain that you never had before. °· You feel your baby’s head pushing down and causing pelvic pressure. °· Your baby is not moving inside you as much as it used to. °Summary °· Contractions that occur before labor are called Braxton   Hicks contractions, false labor, or practice contractions. °· Braxton Hicks contractions are usually shorter, weaker, farther apart, and less regular than true labor contractions. True labor contractions usually become progressively stronger and regular and they become more frequent. °· Manage discomfort from Braxton Hicks contractions by  changing position, resting in a warm bath, drinking plenty of water, or practicing deep breathing. °This information is not intended to replace advice given to you by your health care provider. Make sure you discuss any questions you have with your health care provider. °Document Released: 02/14/2017 Document Revised: 02/14/2017 Document Reviewed: 02/14/2017 °Elsevier Interactive Patient Education © 2018 [employer] ° °Fetal Movement Counts °Patient Name: ________________________________________________ Patient Due Date: ____________________ °What is a fetal movement count? °A fetal movement count is the number of times that you feel your baby move during a certain amount of time. This may also be called a fetal kick count. A fetal movement count is recommended for every pregnant woman. You may be asked to start counting fetal movements as early as week 28 of your pregnancy. °Pay attention to when your baby is most active. You may notice your baby's sleep and wake cycles. You may also notice things that make your baby move more. You should do a fetal movement count: °· When your baby is normally most active. °· At the same time each day. ° °A good time to count movements is while you are resting, after having something to eat and drink. °How do I count fetal movements? °1. Find a quiet, comfortable area. Sit, or lie down on your side. °2. Write down the date, the start time and stop time, and the number of movements that you felt between those two times. Take this information with you to your health care visits. °3. For 2 hours, count kicks, flutters, swishes, rolls, and jabs. You should feel at least 10 movements during 2 hours. °4. You may stop counting after you have felt 10 movements. °5. If you do not feel 10 movements in 2 hours, have something to eat and drink. Then, keep resting and counting for 1 hour. If you feel at least 4 movements during that hour, you may stop counting. °Contact a health care  provider if: °· You feel fewer than 4 movements in 2 hours. °· Your baby is not moving like he or she usually does. °Date: ____________ Start time: ____________ Stop time: ____________ Movements: ____________ °Date: ____________ Start time: ____________ Stop time: ____________ Movements: ____________ °Date: ____________ Start time: ____________ Stop time: ____________ Movements: ____________ °Date: ____________ Start time: ____________ Stop time: ____________ Movements: ____________ °Date: ____________ Start time: ____________ Stop time: ____________ Movements: ____________ °Date: ____________ Start time: ____________ Stop time: ____________ Movements: ____________ °Date: ____________ Start time: ____________ Stop time: ____________ Movements: ____________ °Date: ____________ Start time: ____________ Stop time: ____________ Movements: ____________ °Date: ____________ Start time: ____________ Stop time: ____________ Movements: ____________ °This information is not intended to replace advice given to you by your health care provider. Make sure you discuss any questions you have with your health care provider. °Document Released: 10/31/2006 Document Revised: 05/30/2016 Document Reviewed: 11/10/2015 °Elsevier Interactive Patient Education © 2018 [employer] ° °

## 2018-06-18 NOTE — Progress Notes (Signed)
Amnisure specimen obtained

## 2018-06-26 ENCOUNTER — Other Ambulatory Visit: Payer: Self-pay | Admitting: Obstetrics & Gynecology

## 2018-06-27 ENCOUNTER — Encounter (HOSPITAL_COMMUNITY): Payer: Self-pay | Admitting: *Deleted

## 2018-06-27 ENCOUNTER — Inpatient Hospital Stay (HOSPITAL_COMMUNITY)
Admission: AD | Admit: 2018-06-27 | Discharge: 2018-06-27 | Disposition: A | Discharge status: Home / Self Care | Payer: Medicaid Other | Source: Ambulatory Visit | Attending: Obstetrics and Gynecology | Admitting: Obstetrics and Gynecology

## 2018-06-27 ENCOUNTER — Telehealth (HOSPITAL_COMMUNITY): Payer: Self-pay | Admitting: *Deleted

## 2018-06-27 DIAGNOSIS — O479 False labor, unspecified: Secondary | ICD-10-CM

## 2018-06-27 DIAGNOSIS — Z3A38 38 weeks gestation of pregnancy: Secondary | ICD-10-CM | POA: Insufficient documentation

## 2018-06-27 DIAGNOSIS — O471 False labor at or after 37 completed weeks of gestation: Secondary | ICD-10-CM | POA: Insufficient documentation

## 2018-06-27 DIAGNOSIS — O4693 Antepartum hemorrhage, unspecified, third trimester: Secondary | ICD-10-CM | POA: Insufficient documentation

## 2018-06-27 DIAGNOSIS — M545 Low back pain: Secondary | ICD-10-CM | POA: Diagnosis not present

## 2018-06-27 DIAGNOSIS — R102 Pelvic and perineal pain: Secondary | ICD-10-CM | POA: Diagnosis present

## 2018-06-27 DIAGNOSIS — M5430 Sciatica, unspecified side: Secondary | ICD-10-CM | POA: Diagnosis not present

## 2018-06-27 DIAGNOSIS — O26893 Other specified pregnancy related conditions, third trimester: Secondary | ICD-10-CM | POA: Insufficient documentation

## 2018-06-27 NOTE — MAU Note (Signed)
Pt reports bloody show since last pm, pressure and contractions since this am. Reports good fetal movement

## 2018-06-27 NOTE — Discharge Instructions (Signed)
Braxton Hicks Contractions °Contractions of the uterus can occur throughout pregnancy, but they are not always a sign that you are in labor. You may have practice contractions called Braxton Hicks contractions. These false labor contractions are sometimes confused with true labor. °What are Braxton Hicks contractions? °Braxton Hicks contractions are tightening movements that occur in the muscles of the uterus before labor. Unlike true labor contractions, these contractions do not result in opening (dilation) and thinning of the cervix. Toward the end of pregnancy (32-34 weeks), Braxton Hicks contractions can happen more often and may become stronger. These contractions are sometimes difficult to tell apart from true labor because they can be very uncomfortable. You should not feel embarrassed if you go to the hospital with false labor. °Sometimes, the only way to tell if you are in true labor is for your health care provider to look for changes in the cervix. The health care provider will do a physical exam and may monitor your contractions. If you are not in true labor, the exam should show that your cervix is not dilating and your water has not broken. °If there are other health problems associated with your pregnancy, it is completely safe for you to be sent home with false labor. You may continue to have Braxton Hicks contractions until you go into true labor. °How to tell the difference between true labor and false labor °True labor °· Contractions last 30-70 seconds. °· Contractions become very regular. °· Discomfort is usually felt in the top of the uterus, and it spreads to the lower abdomen and low back. °· Contractions do not go away with walking. °· Contractions usually become more intense and increase in frequency. °· The cervix dilates and gets thinner. °False labor °· Contractions are usually shorter and not as strong as true labor contractions. °· Contractions are usually irregular. °· Contractions  are often felt in the front of the lower abdomen and in the groin. °· Contractions may go away when you walk around or change positions while lying down. °· Contractions get weaker and are shorter-lasting as time goes on. °· The cervix usually does not dilate or become thin. °Follow these instructions at home: °· Take over-the-counter and prescription medicines only as told by your health care provider. °· Keep up with your usual exercises and follow other instructions from your health care provider. °· Eat and drink lightly if you think you are going into labor. °· If Braxton Hicks contractions are making you uncomfortable: °? Change your position from lying down or resting to walking, or change from walking to resting. °? Sit and rest in a tub of warm water. °? Drink enough fluid to keep your urine pale yellow. Dehydration may cause these contractions. °? Do slow and deep breathing several times an hour. °· Keep all follow-up prenatal visits as told by your health care provider. This is important. °Contact a health care provider if: °· You have a fever. °· You have continuous pain in your abdomen. °Get help right away if: °· Your contractions become stronger, more regular, and closer together. °· You have fluid leaking or gushing from your vagina. °· You pass blood-tinged mucus (bloody show). °· You have bleeding from your vagina. °· You have low back pain that you never had before. °· You feel your baby’s head pushing down and causing pelvic pressure. °· Your baby is not moving inside you as much as it used to. °Summary °· Contractions that occur before labor are called Braxton   Hicks contractions, false labor, or practice contractions.  Braxton Hicks contractions are usually shorter, weaker, farther apart, and less regular than true labor contractions. True labor contractions usually become progressively stronger and regular and they become more frequent.  [facility] from [facility] by  changing position, resting in a warm bath, drinking plenty of water, or practicing deep breathing. This information is not intended to replace advice given to you by your health care provider. Make sure you discuss any questions you have with your health care provider. Document Released: 02/14/2017 Document Revised: 02/14/2017 Document Reviewed: 02/14/2017 Elsevier Interactive Patient Education  2018 [employer]   Sciatica Sciatica is pain, numbness, weakness, or tingling along your sciatic nerve. The sciatic nerve starts in the lower back and goes down the back of each leg. Sciatica happens when this nerve is pinched or has pressure put on it. Sciatica usually goes away on its own or with treatment. Sometimes, sciatica may keep coming back (recur). Follow these instructions at home: Medicines  Take over-the-counter and prescription medicines only as told by your doctor.  Do not drive or use heavy machinery while taking prescription pain medicine. Managing pain  If directed, put ice on the affected area. ? Put ice in a plastic bag. ? Place a towel between your skin and the bag. ? Leave the ice on for 20 minutes, 2-3 times a day.  After icing, apply heat to the affected area before you exercise or as often as told by your doctor. Use the heat source that your doctor tells you to use, such as a moist heat pack or a heating pad. ? Place a towel between your skin and the heat source. ? Leave the heat on for 20-30 minutes. ? Remove the heat if your skin turns bright red. This is especially important if you are unable to feel pain, heat, or cold. You may have a greater risk of getting burned. Activity  Return to your normal activities as told by your doctor. Ask your doctor what activities are safe for you. ? Avoid activities that make your sciatica worse.  Take short rests during the day. Rest in a lying or standing position. This is usually better than sitting to rest. ? When you rest  for a long time, do some physical activity or stretching between periods of rest. ? Avoid sitting for a long time without moving. Get up and move around at least one time each hour.  Exercise and stretch regularly, as told by your doctor.  Do not lift anything that is heavier than 10 lb (4.5 kg) while you have symptoms of sciatica. ? Avoid lifting heavy things even when you do not have symptoms. ? Avoid lifting heavy things over and over.  When you lift objects, always lift in a way that is safe for your body. To do this, you should: ? Bend your knees. ? Keep the object close to your body. ? Avoid twisting. General instructions  Use good posture. ? Avoid leaning forward when you are sitting. ? Avoid hunching over when you are standing.  Stay at a healthy weight.  Wear comfortable shoes that support your feet. Avoid wearing high heels.  Avoid sleeping on a mattress that is too soft or too hard. You might have less pain if you sleep on a mattress that is firm enough to support your back.  Keep all follow-up visits as told by your doctor. This is important. Contact a doctor if:  You have pain  that: ? Wakes you up when you are sleeping. ? Gets worse when you lie down. ? Is worse than the pain you have had in the past. ? Lasts longer than 4 weeks.  You lose weight for without trying. Get help right away if:  You cannot control when you pee (urinate) or poop (have a bowel movement).  You have weakness in any of these areas and it gets worse. ? Lower back. ? Lower belly (pelvis). ? Butt (buttocks). ? Legs.  You have redness or swelling of your back.  You have a burning feeling when you pee. This information is not intended to replace advice given to you by your health care provider. Make sure you discuss any questions you have with your health care provider. Document Released: 07/10/2008 Document Revised: 03/08/2016 Document Reviewed: 06/10/2015 Elsevier Interactive Patient  Education  [employer].

## 2018-06-27 NOTE — MAU Note (Signed)
Urine in lab 

## 2018-06-27 NOTE — Telephone Encounter (Signed)
Preadmission screen  

## 2018-06-27 NOTE — MAU Provider Note (Signed)
History     CSN: [id number]  Arrival date and time: 06/27/18 0910   None     Chief Complaint  Patient presents with  . Pelvic Pain   Patient states she had contractions since this last and that they are not coming regularly. She reports she is feeling these contractions primarily in her back. She also noted some bloody show yesterday and this morning. Her primary concern is the rectal pressure she's feeling which worsens with contractions and causes intermittent unilateral lower extremity numbness and tingling. The numbness and tingling resolve when she is not contracting. She reports good fetal movement.    OB History    Gravida  5   Para  2   Term  2   Preterm  0   AB  2   Living  2     SAB  1   TAB  1   Ectopic  0   Multiple  0   Live Births  2           Past Medical History:  Diagnosis Date  . Anemia    Fe x2  . Asthma   . Chlamydia 2008  . Chronic pain   . Constipation, chronic 11/13/11  . Depression    postpartum - no meds  . Gonorrhea   . History of chlamydia 03/07/2012  . History of trichomoniasis 03/07/2012  . Hx: UTI (urinary tract infection)    Frequently  . Irregular periods/menstrual cycles   . Migraines    Migraines - no med   . Obese   . Trichomonas 2008  . Vaginal delivery--shoulder dystocia 07/10/2012    Past Surgical History:  Procedure Laterality Date  . INDUCED ABORTION    . WISDOM TOOTH EXTRACTION      Family History  Problem Relation Age of Onset  . Hypertension Mother   . Heart disease Mother   . Diabetes Mother   . Stroke Mother   . Hyperlipidemia Mother   . Hypertension Sister   . Thrombophlebitis Sister        clotting issue  . Depression Sister   . Heart murmur Sister   . Heart disease Maternal Grandfather   . Drug abuse Father   . Alcohol abuse Maternal Uncle   . Kidney disease Maternal Uncle        failure  . Rheum arthritis Sister        arthritis vs fibromyalgia  . Fibromyalgia Sister   .  Depression Sister   . Hypertension Sister   . Anesthesia problems Neg Hx   . Hypotension Neg Hx   . Malignant hyperthermia Neg Hx   . Pseudochol deficiency Neg Hx     Social History   Tobacco Use  . Smoking status: Former Smoker    Packs/day: 0.25    Types: Cigarettes  . Smokeless tobacco: Never Used  . Tobacco comment: not since pregnancy  Substance Use Topics  . Alcohol use: Not Currently    Comment: occasional  . Drug use: No    Allergies:  Allergies  Allergen Reactions  . Prednisone Nausea And Vomiting    No medications prior to admission.    Review of Systems  Genitourinary: Positive for vaginal bleeding.  Musculoskeletal: Positive for back pain.  Neurological: Positive for numbness.  All other systems reviewed and are negative.  Physical Exam    Vitals:   06/27/18 0934 06/27/18 0952 06/27/18 1209  BP: (!) 121/94 125/82 116/74  Pulse: 96 76 73  Resp: 18    Temp: 97.7 F (36.5 C)    TempSrc: Oral    SpO2: 100%    Weight: 110.2 kg    Height: 5\' 8"  (1.727 m)      Physical Exam  Nursing note and vitals reviewed. Constitutional: She is oriented to person, place, and time. She appears well-developed and well-nourished.  HENT:  Head: Normocephalic.  Eyes: Pupils are equal, round, and reactive to light.  Cardiovascular: Normal rate, regular rhythm and normal heart sounds.  Respiratory: Effort normal and breath sounds normal.  GI: Soft. There is no tenderness.  Genitourinary: Vagina normal and uterus normal. No bleeding in the vagina. No vaginal discharge found.  Musculoskeletal: Normal range of motion.  Neurological: She is alert and oriented to person, place, and time.  Skin: Skin is warm and dry.  Psychiatric: She has a normal mood and affect. Her behavior is normal. Judgment and thought content normal.    MAU Course  Procedures NST  MDM Patient came in as a labor check but RN requested evaluation of patient for complaints of rectal pressure  and intermittent lower extremity numbness and tingling. Patient is not laboring, her cervical exam is consistent with her most recent exam in the office. This is her third term pregnancy and the fetal head is low and beginning to engage in the pelvis which accounts for her feelings of increased rectal pressure. The intermittent, alternating unilateral lower extremity numbness and tingling with contractions is consistent with sciatic nerve compression from the fetal head. Comfort measures for sciatica and labor precautions provided. Patient to present to [facility] on Monday for IOL.   Assessment and Plan  27 y.o. G5P2 at [redacted]w[redacted]d False labor and sciatic nerve compression  Category 1 FHTs/ reactive NST Discharge home with sciatica comfort measures and labor precautions Patient to present to [facility] on Monday for induction of labor   Janeece Riggers 06/27/2018, 12:25 PM

## 2018-06-30 ENCOUNTER — Inpatient Hospital Stay (HOSPITAL_COMMUNITY)
Admission: RE | Admit: 2018-06-30 | Discharge: 2018-07-02 | DRG: 807 | Disposition: A | Discharge status: Home / Self Care | Payer: Medicaid Other | Attending: Obstetrics & Gynecology | Admitting: Obstetrics & Gynecology

## 2018-06-30 ENCOUNTER — Inpatient Hospital Stay (HOSPITAL_COMMUNITY): Payer: Medicaid Other | Admitting: Anesthesiology

## 2018-06-30 ENCOUNTER — Encounter (HOSPITAL_COMMUNITY): Payer: Self-pay

## 2018-06-30 DIAGNOSIS — D649 Anemia, unspecified: Secondary | ICD-10-CM | POA: Diagnosis present

## 2018-06-30 DIAGNOSIS — O99214 Obesity complicating childbirth: Principal | ICD-10-CM | POA: Diagnosis present

## 2018-06-30 DIAGNOSIS — Z23 Encounter for immunization: Secondary | ICD-10-CM

## 2018-06-30 DIAGNOSIS — J45909 Unspecified asthma, uncomplicated: Secondary | ICD-10-CM | POA: Diagnosis present

## 2018-06-30 DIAGNOSIS — Z641 Problems related to multiparity: Secondary | ICD-10-CM

## 2018-06-30 DIAGNOSIS — O9902 Anemia complicating childbirth: Secondary | ICD-10-CM | POA: Diagnosis present

## 2018-06-30 DIAGNOSIS — O9952 Diseases of the respiratory system complicating childbirth: Secondary | ICD-10-CM | POA: Diagnosis present

## 2018-06-30 DIAGNOSIS — Z3A39 39 weeks gestation of pregnancy: Secondary | ICD-10-CM | POA: Diagnosis not present

## 2018-06-30 DIAGNOSIS — E669 Obesity, unspecified: Secondary | ICD-10-CM | POA: Diagnosis present

## 2018-06-30 DIAGNOSIS — Z87891 Personal history of nicotine dependence: Secondary | ICD-10-CM | POA: Diagnosis not present

## 2018-06-30 DIAGNOSIS — Z3490 Encounter for supervision of normal pregnancy, unspecified, unspecified trimester: Secondary | ICD-10-CM

## 2018-06-30 LAB — CBC
HEMATOCRIT: 32.7 % — AB (ref 36.0–46.0)
HEMOGLOBIN: 10.7 g/dL — AB (ref 12.0–15.0)
MCH: 28.6 pg (ref 26.0–34.0)
MCHC: 32.7 g/dL (ref 30.0–36.0)
MCV: 87.4 fL (ref 78.0–100.0)
Platelets: 264 10*3/uL (ref 150–400)
RBC: 3.74 MIL/uL — ABNORMAL LOW (ref 3.87–5.11)
RDW: 14.5 % (ref 11.5–15.5)
WBC: 6.8 10*3/uL (ref 4.0–10.5)

## 2018-06-30 LAB — RPR: RPR Ser Ql: NONREACTIVE

## 2018-06-30 LAB — TYPE AND SCREEN
ABO/RH(D): O POS
ANTIBODY SCREEN: NEGATIVE

## 2018-06-30 MED ORDER — FERROUS SULFATE 325 (65 FE) MG PO TABS
325.0000 mg | ORAL_TABLET | Freq: Two times a day (BID) | ORAL | Status: DC
  Administered 2018-07-01 – 2018-07-02 (×3): 325 mg via ORAL
  Filled 2018-06-30 (×4): qty 1

## 2018-06-30 MED ORDER — LIDOCAINE HCL (PF) 1 % IJ SOLN
30.0000 mL | INTRAMUSCULAR | Status: DC | PRN
  Filled 2018-06-30: qty 30

## 2018-06-30 MED ORDER — ONDANSETRON HCL 4 MG/2ML IJ SOLN
4.0000 mg | Freq: Four times a day (QID) | INTRAMUSCULAR | Status: DC | PRN
  Administered 2018-06-30: 4 mg via INTRAVENOUS
  Filled 2018-06-30: qty 2

## 2018-06-30 MED ORDER — OXYTOCIN 40 UNITS IN LACTATED RINGERS INFUSION - SIMPLE MED: 2.5000 [IU]/h | INTRAVENOUS | Status: DC

## 2018-06-30 MED ORDER — FLEET ENEMA 7-19 GM/118ML RE ENEM: 1.0000 | ENEMA | RECTAL | Status: DC | PRN

## 2018-06-30 MED ORDER — EPHEDRINE 5 MG/ML INJ
10.0000 mg | INTRAVENOUS | Status: DC | PRN
  Filled 2018-06-30: qty 2

## 2018-06-30 MED ORDER — DIBUCAINE 1 % RE OINT: 1.0000 "application " | TOPICAL_OINTMENT | RECTAL | Status: DC | PRN

## 2018-06-30 MED ORDER — SENNOSIDES-DOCUSATE SODIUM 8.6-50 MG PO TABS
2.0000 | ORAL_TABLET | ORAL | Status: DC
  Administered 2018-07-01 (×2): 2 via ORAL
  Filled 2018-06-30 (×2): qty 2

## 2018-06-30 MED ORDER — ONDANSETRON HCL 4 MG PO TABS: 4.0000 mg | ORAL_TABLET | ORAL | Status: DC | PRN

## 2018-06-30 MED ORDER — PRENATAL MULTIVITAMIN CH
1.0000 | ORAL_TABLET | Freq: Every day | ORAL | Status: DC
  Administered 2018-07-01 – 2018-07-02 (×2): 1 via ORAL
  Filled 2018-06-30 (×2): qty 1

## 2018-06-30 MED ORDER — SIMETHICONE 80 MG PO CHEW: 80.0000 mg | CHEWABLE_TABLET | ORAL | Status: DC | PRN

## 2018-06-30 MED ORDER — LACTATED RINGERS IV SOLN: 500.0000 mL | INTRAVENOUS | Status: DC | PRN

## 2018-06-30 MED ORDER — OXYTOCIN 40 UNITS IN LACTATED RINGERS INFUSION - SIMPLE MED
1.0000 m[IU]/min | INTRAVENOUS | Status: DC
  Filled 2018-06-30: qty 1000

## 2018-06-30 MED ORDER — LIDOCAINE HCL (PF) 1 % IJ SOLN
INTRAMUSCULAR | Status: DC | PRN
  Administered 2018-06-30: 13 mL via EPIDURAL

## 2018-06-30 MED ORDER — OXYTOCIN 40 UNITS IN LACTATED RINGERS INFUSION - SIMPLE MED
1.0000 m[IU]/min | INTRAVENOUS | Status: DC
  Administered 2018-06-30: 2 m[IU]/min via INTRAVENOUS

## 2018-06-30 MED ORDER — MISOPROSTOL 25 MCG QUARTER TABLET
25.0000 ug | ORAL_TABLET | ORAL | Status: DC | PRN
  Filled 2018-06-30: qty 1

## 2018-06-30 MED ORDER — TETANUS-DIPHTH-ACELL PERTUSSIS 5-2.5-18.5 LF-MCG/0.5 IM SUSP: 0.5000 mL | Freq: Once | INTRAMUSCULAR | Status: DC

## 2018-06-30 MED ORDER — ZOLPIDEM TARTRATE 5 MG PO TABS: 5.0000 mg | ORAL_TABLET | Freq: Every evening | ORAL | Status: DC | PRN

## 2018-06-30 MED ORDER — IBUPROFEN 600 MG PO TABS
600.0000 mg | ORAL_TABLET | Freq: Four times a day (QID) | ORAL | Status: DC
  Administered 2018-07-01 – 2018-07-02 (×7): 600 mg via ORAL
  Filled 2018-06-30 (×7): qty 1

## 2018-06-30 MED ORDER — ONDANSETRON HCL 4 MG/2ML IJ SOLN: 4.0000 mg | INTRAMUSCULAR | Status: DC | PRN

## 2018-06-30 MED ORDER — PHENYLEPHRINE 40 MCG/ML (10ML) SYRINGE FOR IV PUSH (FOR BLOOD PRESSURE SUPPORT)
80.0000 ug | PREFILLED_SYRINGE | INTRAVENOUS | Status: DC | PRN
  Filled 2018-06-30 (×2): qty 10
  Filled 2018-06-30: qty 5

## 2018-06-30 MED ORDER — BENZOCAINE-MENTHOL 20-0.5 % EX AERO: 1.0000 "application " | INHALATION_SPRAY | CUTANEOUS | Status: DC | PRN

## 2018-06-30 MED ORDER — OXYTOCIN BOLUS FROM INFUSION
500.0000 mL | Freq: Once | INTRAVENOUS | Status: AC
  Administered 2018-06-30: 500 mL via INTRAVENOUS

## 2018-06-30 MED ORDER — DIPHENHYDRAMINE HCL 25 MG PO CAPS: 25.0000 mg | ORAL_CAPSULE | Freq: Four times a day (QID) | ORAL | Status: DC | PRN

## 2018-06-30 MED ORDER — PHENYLEPHRINE 40 MCG/ML (10ML) SYRINGE FOR IV PUSH (FOR BLOOD PRESSURE SUPPORT)
80.0000 ug | PREFILLED_SYRINGE | INTRAVENOUS | Status: DC | PRN
  Filled 2018-06-30: qty 5

## 2018-06-30 MED ORDER — LACTATED RINGERS IV SOLN: 500.0000 mL | Freq: Once | INTRAVENOUS | Status: DC

## 2018-06-30 MED ORDER — WITCH HAZEL-GLYCERIN EX PADS: 1.0000 "application " | MEDICATED_PAD | CUTANEOUS | Status: DC | PRN

## 2018-06-30 MED ORDER — FENTANYL CITRATE (PF) 100 MCG/2ML IJ SOLN: 50.0000 ug | INTRAMUSCULAR | Status: DC | PRN

## 2018-06-30 MED ORDER — TERBUTALINE SULFATE 1 MG/ML IJ SOLN
0.2500 mg | Freq: Once | INTRAMUSCULAR | Status: DC | PRN
  Filled 2018-06-30: qty 1

## 2018-06-30 MED ORDER — SOD CITRATE-CITRIC ACID 500-334 MG/5ML PO SOLN: 30.0000 mL | ORAL | Status: DC | PRN

## 2018-06-30 MED ORDER — ACETAMINOPHEN 325 MG PO TABS: 650.0000 mg | ORAL_TABLET | ORAL | Status: DC | PRN

## 2018-06-30 MED ORDER — DIPHENHYDRAMINE HCL 50 MG/ML IJ SOLN: 12.5000 mg | INTRAMUSCULAR | Status: DC | PRN

## 2018-06-30 MED ORDER — LACTATED RINGERS IV SOLN
INTRAVENOUS | Status: DC
  Administered 2018-06-30 (×3): via INTRAVENOUS

## 2018-06-30 MED ORDER — FENTANYL 2.5 MCG/ML BUPIVACAINE 1/10 % EPIDURAL INFUSION (WH - ANES)
14.0000 mL/h | INTRAMUSCULAR | Status: DC | PRN
  Administered 2018-06-30: 14 mL/h via EPIDURAL
  Filled 2018-06-30: qty 100

## 2018-06-30 MED ORDER — COCONUT OIL OIL: 1.0000 "application " | TOPICAL_OIL | Status: DC | PRN

## 2018-06-30 NOTE — Anesthesia Pain Management Evaluation Note (Signed)
  [facility] Pain Management Visit Note  Patient: Margaret Cline, 27 y.o., female  "Hello [facility] am a [facility]. We have an anesthesia team available at all times to provide care throughout the hospital, including epidural management and anesthesia for C-section. I don't know your plan for the delivery whether it a natural birth, water birth, IV sedation, nitrous supplementation, doula or epidural, but we want to meet your pain goals."   1.Was your pain managed to your expectations on prior hospitalizations?   Yes   2.What is your expectation for pain management during this hospitalization?     Epidural  3.How can we help you reach that goal? epidural  Record the patient's initial score and the patient's pain goal.   Pain: 3  Pain Goal: 6 [facility] you to be able to say your pain was always managed very well.  Ramonda Galyon 06/30/2018

## 2018-06-30 NOTE — H&P (Signed)
Collins Penagos is a 27 y.o. female, [id number], IUP at 39.1 weeks, presenting for Elective IOL, with maternal obesity , BMI 36.9. GBS neg, Pt endorse + Fm. Denies vaginal leakage. Denies vaginal bleeding. Endorses feeling mild ocntractions.   Patient Active Problem List   Diagnosis Date Noted  . 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 Chronic migraine without aura with status migrainosus, not intractable 02/23/2015  . Persistent headaches 02/23/2015  . Cigarette nicotine dependence without complication 93/26/7124  . BMI 33.0-33.9,adult 02/23/2015  . Major depression, recurrent ([place]) 08/23/2013  . Suicide attempt by multiple drug overdose ([place]) 08/23/2013  . Asthma 03/20/2012  . Obesity 03/07/2012  . Migraine 03/07/2012    Pregnancy Problems maternal obesity complicating pregnancy, childbirth and the puerperium, antepartum (BMI 38.  APFT starting at 36 weeks) asthma (Stable--inhaler prn) prednisone allergy  rubella non-immune (Non-immune--offer vaccine pp) varicella status (Non-immune--offer vaccine pp) vomiting of pregnancy (Diclegis, Zofran not working Rx Reglan) Medications ferrous sulfate  Folivane-OB  metoclopramide HCl  ondansetron  Vitafol Gummies   Medications Prior to Admission  Medication Sig Dispense Refill Last Dose  . albuterol (PROAIR HFA) 108 (90 BASE) MCG/ACT inhaler Inhale 2 puffs into the lungs every 4 (four) hours as needed for wheezing or shortness of breath. For shortness of breath/wheezing   PRN at PRN  . albuterol (PROVENTIL) (2.5 MG/3ML) 0.083% nebulizer solution Take 3 mLs (2.5 mg total) by nebulization every 4 (four) hours as needed for wheezing or shortness of breath. 30 vial 0 Past Week at Unknown time  . cetirizine (ZYRTEC) 10 MG tablet Take 10 mg daily as needed by mouth for allergies or rhinitis.    Past Week at Unknown time  . cyclobenzaprine (FLEXERIL) 5 MG tablet Take 1 tablet (5 mg total) by mouth 3 (three) times daily as needed for muscle  spasms. 12 tablet 0   . ferrous sulfate 325 (65 FE) MG tablet Take 1 tablet (325 mg total) by mouth daily with breakfast. 60 tablet 3   . loratadine (CLARITIN) 10 MG tablet Take 1 tablet (10 mg total) by mouth daily. (Patient taking differently: Take 10 mg daily as needed by mouth for allergies or rhinitis. ) 20 tablet 0 Unknown at Unknown time  . Prenatal Multivit-Min-Fe-FA (PRENATAL VITAMINS) 0.8 MG tablet Take 1 tablet by mouth daily. 30 tablet 12 Past Week at Unknown time    Past Medical History:  Diagnosis Date  . Anemia    Fe x2  . Asthma   . Chlamydia 2008  . Chronic pain   . Constipation, chronic 11/13/11  . Depression    postpartum - no meds  . Gonorrhea   . History of chlamydia 03/07/2012  . History of trichomoniasis 03/07/2012  . Hx: UTI (urinary tract infection)    Frequently  . Irregular periods/menstrual cycles   . Migraines    Migraines - no med   . Obese   . Trichomonas 2008  . Vaginal delivery--shoulder dystocia 07/10/2012     No current facility-administered medications on file prior to encounter.    Current Outpatient Medications on File Prior to Encounter  Medication Sig Dispense Refill  . albuterol (PROAIR HFA) 108 (90 BASE) MCG/ACT inhaler Inhale 2 puffs into the lungs every 4 (four) hours as needed for wheezing or shortness of breath. For shortness of breath/wheezing    . albuterol (PROVENTIL) (2.5 MG/3ML) 0.083% nebulizer solution Take 3 mLs (2.5 mg total) by nebulization every 4 (four) hours as needed for wheezing or shortness of breath.  30 vial 0  . cetirizine (ZYRTEC) 10 MG tablet Take 10 mg daily as needed by mouth for allergies or rhinitis.     . cyclobenzaprine (FLEXERIL) 5 MG tablet Take 1 tablet (5 mg total) by mouth 3 (three) times daily as needed for muscle spasms. 12 tablet 0  . ferrous sulfate 325 (65 FE) MG tablet Take 1 tablet (325 mg total) by mouth daily with breakfast. 60 tablet 3  . loratadine (CLARITIN) 10 MG tablet Take 1 tablet (10 mg  total) by mouth daily. (Patient taking differently: Take 10 mg daily as needed by mouth for allergies or rhinitis. ) 20 tablet 0  . Prenatal Multivit-Min-Fe-FA (PRENATAL VITAMINS) 0.8 MG tablet Take 1 tablet by mouth daily. 30 tablet 12     Allergies  Allergen Reactions  . Prednisone Nausea And Vomiting    History of present pregnancy: Pt Info/Preference:  Screening/Consents:  Labs:   EDD: Estimated Date of Delivery: 07/06/18  Establised: Patient's last menstrual period was 09/29/2017 (exact date).  Anatomy Scan: Date: 02/10/2018 Placenta Location: posterior  Genetic Screen: Panoroma:normal AFP:  First Tri: Quad:  Office: [facility]           First PNV: 8.1 wg Blood Type O/Positive/-- (02/11 0000)  Language: Don Broach Last PNV: 38.4 wg  Rhogam    Flu Vaccine:  Declined   Antibody n (02/11 0000)  TDaP vaccine Recieved   GTT: Early: 5.1 hga1c Third Trimester: normal  Feeding Plan: Breast BTL: No Rubella: Nonimmune (02/11 0000)  Contraception: ??? VBAC: No RPR: Nonreactive (02/11 0000)   Circumcision: No Baby female   HBsAg: Negative (02/11 0000)  Pediatrician:  ???   HIV: Non-reactive (02/11 0000)   Prenatal Classes: No Additional [place]: Yes growth see belwo GBS: Negative (07/18 0000)(For PCN allergy, check sensitivities)       Chlamydia: neg    MFM Referral/Consult:  GC: neg  Support Person: Partner   PAP: ???  Pain Management: Epirdural Neonatologist Referral:  Hgb Electrophoresis:  AA  Birth Plan: No   Hgb NOB: 11.7    28W: 10.1  05/07/2018 growth scan  OB History    Gravida  5   Para  2   Term  2   Preterm  0   AB  2   Living  2     SAB  1   TAB  1   Ectopic  0   Multiple  0   Live Births  2          Past Medical History:  Diagnosis Date  . Anemia    Fe x2  . Asthma   . Chlamydia 2008  . Chronic pain   . Constipation, chronic 11/13/11  . Depression    postpartum - no meds  . Gonorrhea   . History of chlamydia 03/07/2012  . History of trichomoniasis  03/07/2012  . Hx: UTI (urinary tract infection)    Frequently  . Irregular periods/menstrual cycles   . Migraines    Migraines - no med   . Obese   . Trichomonas 2008  . Vaginal delivery--shoulder dystocia 07/10/2012   Past Surgical History:  Procedure Laterality Date  . INDUCED ABORTION    . WISDOM TOOTH EXTRACTION     Family History: family history includes Alcohol abuse in her maternal uncle; Depression in her sister and sister; Diabetes in her mother; Drug abuse in her father; Fibromyalgia in her sister; Heart disease in her maternal grandfather and mother; Heart murmur in her sister;  Hyperlipidemia in her mother; Hypertension in her mother, sister, and sister; Kidney disease in her maternal uncle; Rheum arthritis in her sister; Stroke in her mother; Thrombophlebitis in her sister. Social History:  reports that she has quit smoking. Her smoking use included cigarettes. She smoked 0.25 packs per day. She has never used smokeless tobacco. She reports that she drank alcohol. She reports that she does not use drugs.   Prenatal Transfer Tool  Maternal Diabetes: No Genetic Screening: Normal Maternal Ultrasounds/Referrals: Normal Fetal Ultrasounds or other Referrals:  None Maternal Substance Abuse:  No Significant Maternal Medications:  None Significant Maternal Lab Results: None  ROS:  Review of Systems  All other systems reviewed and are negative.    Physical Exam: BP 122/78   Pulse 85   Temp 97.6 F (36.4 C) (Oral)   Resp 18   Ht '5\' 8"'$  (1.727 m)   Wt 110.2 kg   LMP 09/29/2017 (Exact Date)   BMI 36.95 kg/m   Physical Exam  Constitutional: She is oriented to person, place, and time and well-developed, well-nourished, and in no distress.  HENT:  Head: Normocephalic and atraumatic.  Eyes: Pupils are equal, round, and reactive to light. Conjunctivae are normal.  Neck: Normal range of motion. Neck supple.  Cardiovascular: Normal rate and regular rhythm.  Pulmonary/Chest:  Effort normal and breath sounds normal.  Abdominal: Soft. Bowel sounds are normal.  Genitourinary: Cervix normal.  Genitourinary Comments: Uterus: gravida equal to dates, pelvic adaquate for vaginal delivery.   Musculoskeletal: Normal range of motion.  Neurological: She is alert and oriented to person, place, and time. Gait normal.  Skin: Skin is warm and dry.  Psychiatric: Affect normal.  Nursing note and vitals reviewed.    NST: FHR baseline 130 bpm, Variability: moderate, Accelerations:present, Decelerations:  Absent= Cat 1/Reactive UC:   irregular, every 3 minutes, mild to palpate, lasting 30-60 seconds SVE:3/50/-2    , vertex verified by fetal sutures and by bedside US  Leopold's: Position vertex, EFW 8.5 pounds via leopold's.   Labs: No results found for this or any previous visit (from the past 24 hour(s)).  Imaging:  No results found.  MAU Course: Orders Placed This Encounter  Procedures  . CBC  . RPR  . Diet clear liquid Room service appropriate? Yes; Fluid consistency: Thin  . Discontinue Pitocin if tachysystole with non-reassuring FHR is present  . Evaluate fetal heart rate to establish reassuring pattern prior to initiating Cytotec or Pitocin  . If tachysystole WITH reassuring FHR present notify MD / CNM  . Initiate intrauterine resuscitation if tachysystole with non-reasuring FHR is present  . Labor Induction  . May administer Terbutaline 0.25 mg SQ x 1 dose if tachysystole with non-reassuring FHR is presesnt  . Nofify MD/CNM if tachysystole with non-reassuring FHR is present  . Perform a cervical exam prior to initiating Cytotec or Pitocin  . Activity as tolerated  . Fetal monitoring per unit policy  . Notify Physician  . Vitals signs per unit policy  . Order Rapid HIV per protocol if no results on chart  . Cervical Exam  . Discontinue foley prior to vaginal delivery  . Fundal check post delivery every 15 min x 1 hour then every 30 min x 1 hour  . If Rapid  HIV test positive or known HIV positive: initiate AZT orders  . Initiate Carrier Fluid Protocol  . Insert foley catheter  . May in and out cath x 2 for inability to void  . Measure  blood pressure post delivery every 15 min x 1 hour then every 30 min x 1 hour  . Patient may have epidural placement upon request  . Full code  . Oxygen therapy  . Type and screen  . Insert and maintain IV Line  . Admit to Inpatient (patient's expected length of stay will be greater than 2 midnights or inpatient only procedure)   Meds ordered this encounter  Medications  . misoprostol (CYTOTEC) tablet 25 mcg  . oxytocin (PITOCIN) IV infusion 40 units in LR 1000 mL - Premix    Order Specific Question:   Begin infusion at:    Answer:   1 milli-unit/min (1.5 mL/hr)    Order Specific Question:   Increase infusion by:    Answer:   1 milli-unit/min (1.5 mL/hr)  . terbutaline (BRETHINE) injection 0.25 mg  . lactated ringers infusion  . lactated ringers infusion 500-1,000 mL  . oxytocin (PITOCIN) IV BOLUS FROM BAG  . oxytocin (PITOCIN) IV infusion 40 units in LR 1000 mL - Premix  . acetaminophen (TYLENOL) tablet 650 mg  . fentaNYL (SUBLIMAZE) injection 50-100 mcg  . lidocaine (PF) (XYLOCAINE) 1 % injection 30 mL  . ondansetron (ZOFRAN) injection 4 mg  . sodium citrate-citric acid (ORACIT) solution 30 mL  . sodium phosphate (FLEET) 7-19 GM/118ML enema 1 enema    Assessment/Plan: Byron Tipping is a 27 y.o. female, [id number], IUP at 39.1 weeks, presenting for Elective IOL, with maternal obesity , BMI 36.9. GBS neg, pt stable.   FWB: Cat 1 Fetal Tracing.  I discussed with patient risks, benefits and alternatives of labor induction including higher risk of cesarean delivery compared to spontaneous labor.  We discussed risks of induction agents including effects on fetal heart beat, contraction pattern and need for close monitoring.  Patient expressed understanding of all this and desired to proceed with the  induction. Risks and benefits of induction were reviewed, including failure of method, prolonged labor, need for further intervention, risk of cesarean.  Patient and family verbalized understanding and denies any further questions at this time. Pt and family wish to proceed with induction process. Discussed induction options of Cytotec, foley bulb, AROM, and pitocin were reviewed as well as risks and benefits with use of each discussed.   Plan: Admit to [facility] per consult with Dr Alwyn Pea Routine induction [facility] orders Start induction with pitocin 2X2 Pain med/epidural prn Anticipate labor progression   Offer varicella PP Offer MMR PP  Lindsee Labarre NP-C, CNM, MSN 06/30/2018, 7:59 AM

## 2018-06-30 NOTE — Progress Notes (Signed)
Labor Progress Note  Alto DenverRaven Wagley, 27 y.o., [phone], with an IUP @ 9484w1d, presented for Elective IOL with maternal obesity (BMI 36.9)  Subjective: Pt resting bed in stable condition, pt starting to feel cxt and would like to have her epidural once they are a little more intense. Pt able to pace breath through her cxt. Family at bedside and denies any questions.   Patient Active Problem List   Diagnosis Date Noted  . 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 Chronic migraine without aura with status migrainosus, not intractable 02/23/2015  . Persistent headaches 02/23/2015  . Cigarette nicotine dependence without complication 02/23/2015  . BMI 33.0-33.9,adult 02/23/2015  . Major depression, recurrent ([facility]) 08/23/2013  . Suicide attempt by multiple drug overdose ([facility]) 08/23/2013  . Asthma 03/20/2012  . Obesity 03/07/2012  . Migraine 03/07/2012   Objective: BP 117/68   Pulse 80   Temp 97.6 F (36.4 C) (Oral)   Resp 18   Ht 5\' 8"  (1.727 m)   Wt 110.2 kg   LMP 09/29/2017 (Exact Date)   BMI 36.95 kg/m  No intake/output data recorded. No intake/output data recorded. NST: FHR baseline 130 bpm, Variability: moderate, Accelerations:present, Decelerations:  Absent= Cat 1/Reactive CTX:  regular, every 2-3 minutes Uterus gravid, soft non tender, mild to palpate with contractions.  SVE:  Dilation: 3(unchanged) Effacement (%): 60 Station: -2 Exam by:: Eaton CorporationJade Zuriel Roskos, CNM Pitocin at (12) mUn/min  Assessment:  Alto DenverRaven Silversmith, 27 y.o., [phone], with an IUP @ 3284w1d, presented for Elective IOL with maternal obesity (BMI 36.9). Pt stable and breathing through cxt, cervical exam unchanged.  Patient Active Problem List   Diagnosis Date Noted  . 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 Chronic migraine without aura with status migrainosus, not intractable 02/23/2015  . Persistent headaches 02/23/2015  . Cigarette nicotine dependence without complication 02/23/2015  .  BMI 33.0-33.9,adult 02/23/2015  . Major depression, recurrent ([facility]) 08/23/2013  . Suicide attempt by multiple drug overdose ([facility]) 08/23/2013  . Asthma 03/20/2012  . Obesity 03/07/2012  . Migraine 03/07/2012   NICHD: Category 1  Membranes: INTACT, no s/s of infection  Induction:    Pitocin - 12  Pain management:             Epidural placement:  Would like to have placed.   GBS Negative  Plan: Continue induction and labor plan Continuous/intermittent monitoring Rest/Ambulate/Frequent position changes to facilitate fetal rotation and descent. Will reassess with cervical exam at 1800 or earlier if necessar Continue pitocin per protocol 2X2 Anticipated AROM with IUPC if fetal head engaged at 1800 Anticipated epidural placement soon.  Anticipate labor progression and vaginal delivery.   Md Pinn to be updated PRN.   Dale DurhamJade Elzy Tomasello, NP-C, CNM, MSN 06/30/2018. 1:57 PM, Late entry

## 2018-06-30 NOTE — Anesthesia Preprocedure Evaluation (Signed)
Anesthesia Evaluation    Airway Mallampati: III  TM Distance: >3 FB Neck ROM: Full    Dental no notable dental hx.    Pulmonary asthma , former smoker,    Pulmonary exam normal breath sounds clear to auscultation       Cardiovascular Normal cardiovascular exam Rhythm:Regular Rate:Normal     Neuro/Psych    GI/Hepatic   Endo/Other    Renal/GU      Musculoskeletal   Abdominal   Peds  Hematology   Anesthesia Other Findings   Reproductive/Obstetrics (+) Pregnancy                             Anesthesia Physical  Anesthesia Plan  ASA: II  Anesthesia Plan: Epidural   Post-op Pain Management:    Induction:   PONV Risk Score and Plan:   Airway Management Planned:   Additional Equipment:   Intra-op Plan:   Post-operative Plan:   Informed Consent: I have reviewed the patients History and Physical, chart, labs and discussed the procedure including the risks, benefits and alternatives for the proposed anesthesia with the patient or authorized representative who has indicated his/her understanding and acceptance.   Dental Advisory Given  Plan Discussed with:   Anesthesia Plan Comments: (Labs checked- platelets confirmed with RN in room. Fetal heart tracing, per RN, reported to be stable enough for sitting procedure. Discussed epidural, and patient consents to the procedure:  included risk of possible headache,backache, failed block, allergic reaction, and nerve injury. This patient was asked if she had any questions or concerns before the procedure started. )        Anesthesia Quick Evaluation

## 2018-06-30 NOTE — Anesthesia Procedure Notes (Signed)
Epidural Patient location during procedure: OB Start time: 06/30/2018 2:20 PM End time: 06/30/2018 2:32 PM  Staffing Anesthesiologist: Lowella CurbMiller, Warren Ray, MD Performed: anesthesiologist   Preanesthetic Checklist Completed: patient identified, site marked, surgical consent, pre-op evaluation, timeout performed, IV checked, risks and benefits discussed and monitors and equipment checked  Epidural Patient position: sitting Prep: ChloraPrep Patient monitoring: heart rate, cardiac monitor, continuous pulse ox and blood pressure Approach: midline Location: L2-L3 Injection technique: LOR saline  Needle:  Needle type: Tuohy  Needle gauge: 17 G Needle length: 9 cm Needle insertion depth: 7 cm Catheter type: closed end flexible Catheter size: 20 Guage Catheter at skin depth: 11 cm Test dose: negative  Assessment Events: blood not aspirated, injection not painful, no injection resistance, negative IV test and no paresthesia  Additional Notes Reason for block:procedure for pain

## 2018-06-30 NOTE — Progress Notes (Signed)
Labor Progress Note  Alto DenverRaven Wolven, 27 y.o., [phone], with an IUP @ 5425w1d, presented for Elective IOL with maternal obesity (BMI 36.9)  Subjective: Pt resting bed in stable condition, pt starting to feel pressure per RN, but not fully dilated yet. Pt asked for her water to be broken. Pt comfortable after epidural placement.  Patient Active Problem List   Diagnosis Date Noted  . 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 Chronic migraine without aura with status migrainosus, not intractable 02/23/2015  . Persistent headaches 02/23/2015  . Cigarette nicotine dependence without complication 02/23/2015  . BMI 33.0-33.9,adult 02/23/2015  . Major depression, recurrent ([facility]) 08/23/2013  . Suicide attempt by multiple drug overdose ([facility]) 08/23/2013  . Asthma 03/20/2012  . Obesity 03/07/2012  . Migraine 03/07/2012   Objective: BP 122/81   Pulse 66   Temp 98 F (36.7 C) (Oral)   Resp 18   Ht 5\' 8"  (1.727 m)   Wt 110.2 kg   LMP 09/29/2017 (Exact Date)   SpO2 100%   BMI 36.95 kg/m  No intake/output data recorded. No intake/output data recorded. NST: FHR baseline 120 bpm, Variability: moderate, Accelerations:present, Decelerations:  Absent= Cat 1/Reactive CTX:  regular, every 2-4 minutes, lasting 50-80 seocnds Uterus gravid, soft non tender, mild to palpate with contractions.  SVE:  Dilation: 5 Effacement (%): 60 Station: -2 Exam by:: Roselia Snipe cnm Pitocin at (14) mUn/min  Permission grated for AROM, Clear, and permission granted for IUPC placement, pt and fetus tolerated well.   Assessment:  Alto DenverRaven Stettler, 27 y.o., [phone], with an IUP @ 3925w1d, presented for Elective IOL with maternal obesity (BMI 36.9). Pt stable post epidural placement, AROM clear and IUCP placed.  Patient Active Problem List   Diagnosis Date Noted  . 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 Chronic migraine without aura with status migrainosus, not intractable 02/23/2015  . Persistent  headaches 02/23/2015  . Cigarette nicotine dependence without complication 02/23/2015  . BMI 33.0-33.9,adult 02/23/2015  . Major depression, recurrent ([facility]) 08/23/2013  . Suicide attempt by multiple drug overdose ([facility]) 08/23/2013  . Asthma 03/20/2012  . Obesity 03/07/2012  . Migraine 03/07/2012   NICHD: Category 1  Membranes: AROM, Clear @ 5:45pm on 09/16, no s/s of infection  Induction:    Pitocin - 14  AROM: Clear  IUPC @ 5:45 MVU: 180  Pain management:             Epidural placement:  2:30pm 09/16   GBS Negative  Plan: Continue induction and labor plan Continuous monitoring Rest/Frequent position changes to facilitate fetal rotation and descent. Will reassess with cervical exam at 2200 or earlier if necessary Continue pitocin per protocol 2X2, titrate to 200MVU  Anticipate labor progression and vaginal delivery.   Md Pinn to be updated PRN.   Dale DurhamJade Skylor Hughson, NP-C, CNM, MSN 06/30/2018. 6:02 PM, Late entry

## 2018-07-01 LAB — CBC
HEMATOCRIT: 27.5 % — AB (ref 36.0–46.0)
Hemoglobin: 9.3 g/dL — ABNORMAL LOW (ref 12.0–15.0)
MCH: 29.3 pg (ref 26.0–34.0)
MCHC: 33.8 g/dL (ref 30.0–36.0)
MCV: 86.8 fL (ref 78.0–100.0)
PLATELETS: 212 10*3/uL (ref 150–400)
RBC: 3.17 MIL/uL — AB (ref 3.87–5.11)
RDW: 14.4 % (ref 11.5–15.5)
WBC: 10.3 10*3/uL (ref 4.0–10.5)

## 2018-07-01 MED ORDER — PNEUMOCOCCAL VAC POLYVALENT 25 MCG/0.5ML IJ INJ
0.5000 mL | INJECTION | INTRAMUSCULAR | Status: AC
  Administered 2018-07-02: 0.5 mL via INTRAMUSCULAR
  Filled 2018-07-01 (×2): qty 0.5

## 2018-07-01 NOTE — Anesthesia Postprocedure Evaluation (Signed)
Anesthesia Post Note  Patient: Margaret Cline  Procedure(s) Performed: AN AD HOC LABOR EPIDURAL     Patient location during evaluation: Mother Baby Anesthesia Type: Epidural Level of consciousness: awake and alert and oriented Pain management: satisfactory to patient Vital Signs Assessment: post-procedure vital signs reviewed and stable Respiratory status: spontaneous breathing and nonlabored ventilation Cardiovascular status: stable Postop Assessment: no headache, no backache, no signs of nausea or vomiting, adequate PO intake, patient able to bend at knees and able to ambulate (patient up walking) Anesthetic complications: no    Last Vitals:  Vitals:   07/01/18 1100 07/01/18 1521  BP: 104/67 100/63  Pulse: 62 71  Resp: 16 16  Temp: 36.7 C 36.6 C  SpO2: 100% 99%    Last Pain:  Vitals:   07/01/18 1530  TempSrc:   PainSc: 6    Pain Goal: Patients Stated Pain Goal: 0 (07/01/18 0045)               Madison HickmanGREGORY,Quinntin Malter

## 2018-07-01 NOTE — Anesthesia Postprocedure Evaluation (Signed)
Anesthesia Post Note  Patient: Margaret Cline  Procedure(s) Performed: AN AD HOC LABOR EPIDURAL     Patient location during evaluation: Mother Baby Anesthesia Type: Epidural Level of consciousness: awake and alert Pain management: pain level controlled Vital Signs Assessment: post-procedure vital signs reviewed and stable Respiratory status: spontaneous breathing, nonlabored ventilation and respiratory function stable Cardiovascular status: stable Postop Assessment: no headache, no backache and epidural receding Anesthetic complications: no    Last Vitals:  Vitals:   07/01/18 0005 07/01/18 0045  BP: 112/73 113/65  Pulse: 61 (!) 56  Resp: (!) 22 20  Temp: 36.9 C 36.7 C  SpO2: 100% 100%    Last Pain:  Vitals:   07/01/18 0045  TempSrc: Oral  PainSc: 10-Worst pain ever   Pain Goal: Patients Stated Pain Goal: 0 (07/01/18 0045)               Nashay Brickley

## 2018-07-01 NOTE — Discharge Instructions (Signed)
Postpartum Care After Vaginal Delivery ° °The period of time right after you deliver your newborn is called the postpartum period. °What kind of medical care will I receive? °· You may continue to receive fluids and medicines through an IV tube inserted into one of your veins. °· If an incision was made near your vagina (episiotomy) or if you had some vaginal tearing during delivery, cold compresses may be placed on your episiotomy or your tear. This helps to reduce pain and swelling. °· You may be given a squirt bottle to use when you go to the bathroom. You may use this until you are comfortable wiping as usual. To use the squirt bottle, follow these steps: °? Before you urinate, fill the squirt bottle with warm water. Do not use hot water. °? After you urinate, while you are sitting on the toilet, use the squirt bottle to rinse the area around your urethra and vaginal opening. This rinses away any urine and blood. °? You may do this instead of wiping. As you start healing, you may use the squirt bottle before wiping yourself. Make sure to wipe gently. °? Fill the squirt bottle with clean water every time you use the bathroom. °· You will be given sanitary pads to wear. °How can I expect to feel? °· You may not feel the need to urinate for several hours after delivery. °· You will have some soreness and pain in your abdomen and vagina. °· If you are breastfeeding, you may have uterine contractions every time you breastfeed for up to several weeks postpartum. Uterine contractions help your uterus return to its normal size. °· It is normal to have vaginal bleeding (lochia) after delivery. The amount and appearance of lochia is often similar to a menstrual period in the first week after delivery. It will gradually decrease over the next few weeks to a dry, yellow-brown discharge. For most women, lochia stops completely by 6-8 weeks after delivery. Vaginal bleeding can vary from woman to woman. °· Within the first few  days after delivery, you may have breast engorgement. This is when your breasts feel heavy, full, and uncomfortable. Your breasts may also throb and feel hard, tightly stretched, warm, and tender. After this occurs, you may have milk leaking from your breasts. Your health care provider can help you relieve discomfort due to breast engorgement. Breast engorgement should go away within a few days. °· You may feel more sad or worried than normal due to hormonal changes after delivery. These feelings should not last more than a few days. If these feelings do not go away after several days, speak with your health care provider. °How should I care for myself? °· Tell your health care provider if you have pain or discomfort. °· Drink enough water to keep your urine clear or pale yellow. °· Wash your hands thoroughly with soap and water for at least 20 seconds after changing your sanitary pads, after using the toilet, and before holding or feeding your baby. °· If you are not breastfeeding, avoid touching your breasts a lot. Doing this can make your breasts produce more milk. °· If you become weak or lightheaded, or you feel like you might faint, ask for help before: °? Getting out of bed. °? Showering. °· Change your sanitary pads frequently. Watch for any changes in your flow, such as a sudden increase in volume, a change in color, the passing of large blood clots. If you pass a blood clot from your vagina,   save it to show to your health care provider. Do not flush blood clots down the toilet without having your health care provider look at them. °· Make sure that all your vaccinations are up to date. This can help protect you and your baby from getting certain diseases. You may need to have immunizations done before you leave the hospital. °· If desired, talk with your health care provider about methods of family planning or birth control (contraception). °How can I start bonding with my baby? °Spending as much time as  possible with your baby is very important. During this time, you and your baby can get to know each other and develop a bond. Having your baby stay with you in your room (rooming in) can give you time to get to know your baby. Rooming in can also help you become comfortable caring for your baby. Breastfeeding can also help you bond with your baby. °How can I plan for returning home with my baby? °· Make sure that you have a car seat installed in your vehicle. °? Your car seat should be checked by a certified car seat installer to make sure that it is installed safely. °? Make sure that your baby fits into the car seat safely. °· Ask your health care provider any questions you have about caring for yourself or your baby. Make sure that you are able to contact your health care provider with any questions after leaving the hospital. °This information is not intended to replace advice given to you by your health care provider. Make sure you discuss any questions you have with your health care provider. °Document Released: 07/29/2007 Document Revised: 03/05/2016 Document Reviewed: 09/05/2015 °Elsevier Interactive Patient Education © 2018 [employer] ° ° °Postpartum Depression and Baby Blues °The postpartum period begins right after the birth of a baby. During this time, there is often a great amount of joy and excitement. It is also a time of many changes in the life of the parents. Regardless of how many times a mother gives birth, each child brings new challenges and dynamics to the family. It is not unusual to have feelings of excitement along with confusing shifts in moods, emotions, and thoughts. All mothers are at risk of developing postpartum depression or the "baby blues." These mood changes can occur right after giving birth, or they may occur many months after giving birth. The baby blues or postpartum depression can be mild or severe. Additionally, postpartum depression can go away rather quickly, or it can  be a long-term condition. °What are the causes? °Raised hormone levels and the rapid drop in those levels are thought to be a main cause of postpartum depression and the baby blues. A number of hormones change during and after pregnancy. Estrogen and progesterone usually decrease right after the delivery of your baby. The levels of thyroid hormone and various cortisol steroids also rapidly drop. Other factors that play a role in these mood changes include major life events and genetics. °What increases the risk? °If you have any of the following risks for the baby blues or postpartum depression, know what symptoms to watch out for during the postpartum period. Risk factors that may increase the likelihood of getting the baby blues or postpartum depression include: °· Having a personal or family history of depression. °· Having depression while being pregnant. °· Having premenstrual mood issues or mood issues related to oral contraceptives. °· Having a lot of life stress. °· Having marital conflict. °· Lacking   a social support network. °· Having a baby with special needs. °· Having health problems, such as diabetes. ° °What are the signs or symptoms? °Symptoms of baby blues include: °· Brief changes in mood, such as going from extreme happiness to sadness. °· Decreased concentration. °· Difficulty sleeping. °· Crying spells, tearfulness. °· Irritability. °· Anxiety. ° °Symptoms of postpartum depression typically begin within the first month after giving birth. These symptoms include: °· Difficulty sleeping or excessive sleepiness. °· Marked weight loss. °· Agitation. °· Feelings of worthlessness. °· Lack of interest in activity or food. ° °Postpartum psychosis is a very serious condition and can be dangerous. Fortunately, it is rare. Displaying any of the following symptoms is cause for immediate medical attention. Symptoms of postpartum psychosis include: °· Hallucinations and delusions. °· Bizarre or disorganized  behavior. °· Confusion or disorientation. ° °How is this diagnosed? °A diagnosis is made by an evaluation of your symptoms. There are no medical or lab tests that lead to a diagnosis, but there are various questionnaires that a health care provider may use to identify those with the baby blues, postpartum depression, or psychosis. Often, a screening tool called the Edinburgh Postnatal Depression Scale is used to diagnose depression in the postpartum period. °How is this treated? °The baby blues usually goes away on its own in 1-2 weeks. Social support is often all that is needed. You will be encouraged to get adequate sleep and rest. Occasionally, you may be given medicines to help you sleep. °Postpartum depression requires treatment because it can last several months or longer if it is not treated. Treatment may include individual or group therapy, medicine, or both to address any social, physiological, and psychological factors that may play a role in the depression. Regular exercise, a healthy diet, rest, and social support may also be strongly recommended. °Postpartum psychosis is more serious and needs treatment right away. Hospitalization is often needed. °Follow these instructions at home: °· Get as much rest as you can. Nap when the baby sleeps. °· Exercise regularly. Some women find yoga and walking to be beneficial. °· Eat a balanced and nourishing diet. °· Do little things that you enjoy. Have a cup of tea, take a bubble bath, read your favorite magazine, or listen to your favorite music. °· Avoid alcohol. °· Ask for help with household chores, cooking, grocery shopping, or running errands as needed. Do not try to do everything. °· Talk to people close to you about how you are feeling. Get support from your partner, family members, friends, or other new moms. °· Try to stay positive in how you think. Think about the things you are grateful for. °· Do not spend a lot of time alone. °· Only take  over-the-counter or prescription medicine as directed by your health care provider. °· Keep all your postpartum appointments. °· Let your health care provider know if you have any concerns. °Contact a health care provider if: °You are having a reaction to or problems with your medicine. °Get help right away if: °· You have suicidal feelings. °· You think you may harm the baby or someone else. °This information is not intended to replace advice given to you by your health care provider. Make sure you discuss any questions you have with your health care provider. °Document Released: 07/05/2004 Document Revised: 03/08/2016 Document Reviewed: 07/13/2013 °Elsevier Interactive Patient Education © 2017 Else[employer] Inc. ° ° °

## 2018-07-01 NOTE — Progress Notes (Addendum)
Post Partum Day 1 Subjective: no complaints, up ad lib, voiding and tolerating PO. Mild asymptomatic anemia on PO Iron.   Patient has a history of major depressive episode with suicide attempt via overdose. Discussed history with patient today, she reported that her depression and suicidal ideations were related to her life situation at the time. She stated she was more depressed and stressed in early pregnancy due to interpersonal problems with FOB but is feeling good now. She denies symptoms of depression or suicidal/homicidal ideation.   Objective: Vitals:   07/01/18 0045 07/01/18 0245 07/01/18 0400 07/01/18 0832  BP: 113/65 111/73 111/74 117/75  Pulse: (!) 56 (!) 55 (!) 54 (!) 58  Resp: 20 20 18 18   Temp: 98 F (36.7 C) 98 F (36.7 C) 98.2 F (36.8 C) 98.6 F (37 C)  TempSrc: Oral Oral Oral Oral  SpO2: 100% 100% 100%   Weight:      Height:         Physical Exam:  General: alert and cooperative Lochia: appropriate Uterine Fundus: firm Incision: n/a DVT Evaluation: No evidence of DVT seen on physical exam. Negative Homan's sign. No cords or calf tenderness. No significant calf/ankle edema.  Recent Labs    06/30/18 0758 07/01/18 0532  HGB 10.7* 9.3*  HCT 32.7* 27.5*    Assessment/Plan: Plan for discharge tomorrow and Breastfeeding  Social work consult for history of depression and attempted suicide    LOS: 1 day   Janeece Riggersllis K Deonta Bomberger 07/01/2018, 9:12 AM

## 2018-07-01 NOTE — Addendum Note (Signed)
Addendum  created 07/01/18 1558 by Shanon PayorGregory, Kade Demicco M, CRNA   Charge Capture section accepted, Sign clinical note

## 2018-07-01 NOTE — Progress Notes (Signed)
   07/01/18 0100  What Happened  Was fall witnessed? No  Was patient injured? No  Patient found on floor  Found by Staff-comment Andree Elk(Kendra Miller CNA)  Stated prior activity to/from bed, chair, or stretcher  Follow Up  MD notified Dale DurhamJade Montana CNM  Time MD notified [id number]  Family notified Yes-comment  Time family notified 0030  Additional tests No  Simple treatment Other (comment) (tolieting and back to bed)  Adult Fall Risk Assessment  Risk Factor Category (scoring not indicated) Not Applicable  Age 27  Fall History: Fall within 6 months prior to admission 0  Elimination; Bowel and/or Urine Incontinence 0  Elimination; Bowel and/or Urine Urgency/Frequency 0  Medications: includes PCA/Opiates, Anti-convulsants, Anti-hypertensives, Diuretics, Hypnotics, Laxatives, Sedatives, and Psychotropics 0  Patient Care Equipment 1  Mobility-Assistance 2  Mobility-Gait 2  Mobility-Sensory Deficit 0  Altered awareness of immediate physical environment 0  Impulsiveness 0  Lack of understanding of one's physical/cognitive limitations 0  Total Score 5  Patient Fall Risk Level Low fall risk  Adult Fall Risk Interventions  Required Bundle Interventions *See Row Information* Low fall risk - low requirements implemented  Additional Interventions Family Supervision  Patient called out for help. When Tech entered the room- Patient was sitting on the floor. Charge Rn called to bedside. Patient states that she had to go to the bathroom and her legs locked up. Tech and Rn help patient up to steady, assisted with tolieting and returned to bed. Bedside Rn notified.

## 2018-07-01 NOTE — Discharge Summary (Addendum)
OB Discharge Summary     Patient Name: Margaret Cline DOB: 10/21/1990 MRN: [id number]  Date of admission: 06/30/2018 Delivering MD: Dale DurhamMONTANA, JADE   Date of discharge: 07/02/2018  Admitting diagnosis: 39wks induction Intrauterine pregnancy: 4842w1d     Secondary diagnosis:  Active Problems:   Multiparity  Additional problems: History of major depressive disorder with suicide attempt in 2014, migraine, asthma     Discharge diagnosis: Term Pregnancy Delivered                                                                                                Post partum procedures:n/a  Augmentation: AROM and Pitocin  Complications: None  Hospital course:  Induction of Labor With Vaginal Delivery   27 y.o. yo [id number] at 7042w1d was admitted to the hospital 06/30/2018 for induction of labor.  Indication for induction: obesity.  Patient had an uncomplicated labor course as follows: Membrane Rupture Time/Date: 5:44 PM ,06/30/2018   Intrapartum Procedures: Episiotomy: None [1]                                         Lacerations:  None [1]  Patient had delivery of a Viable infant.  Information for the patient's newborn:  Dennard SchaumannHooker, Girl Elliannah [[id number]][[id number]]  Delivery Method: Vag-Spont   06/30/2018  Details of delivery can be found in separate delivery note.  Patient had a routine postpartum course. Patient denies symptoms of depression or suicidal/homicidal ideation. Patient is discharged home 07/02/18.  Physical exam  Vitals:   07/01/18 0832 07/01/18 1100 07/01/18 1521 07/01/18 2249  BP: 117/75 104/67 100/63 123/87  Pulse: (!) 58 62 71 62  Resp: 18 16 16 16   Temp: 98.6 F (37 C) 98.1 F (36.7 C) 97.9 F (36.6 C) 98.7 F (37.1 C)  TempSrc: Oral Oral Oral Oral  SpO2:  100% 99%   Weight:      Height:       General: alert, cooperative and no distress Lochia: appropriate Uterine Fundus: firm Incision: N/A DVT Evaluation: No evidence of DVT seen on physical exam. Negative Homan's sign. No  cords or calf tenderness. No significant calf/ankle edema. Labs: Lab Results  Component Value Date   WBC 10.3 07/01/2018   HGB 9.3 (L) 07/01/2018   HCT 27.5 (L) 07/01/2018   MCV 86.8 07/01/2018   PLT 212 07/01/2018   CMP Latest Ref Rng & Units 10/26/2017  Glucose 65 - 99 mg/dL 88  BUN 6 - 20 mg/dL 8  Creatinine 1.300.44 - 8.651.00 mg/dL 7.840.70  Sodium 696135 - 295145 mmol/L 138  Potassium 3.5 - 5.1 mmol/L 4.4  Chloride 101 - 111 mmol/L 108  CO2 22 - 32 mmol/L 24  Calcium 8.9 - 10.3 mg/dL 2.8(U8.8(L)  Total Protein 6.5 - 8.1 g/dL 6.5  Total Bilirubin 0.3 - 1.2 mg/dL 0.4  Alkaline Phos 38 - 126 U/L 46  AST 15 - 41 U/L 39  ALT 14 - 54 U/L 42    Discharge instruction: per After Visit Summary and "Baby  and Me Booklet".  After visit meds:  Allergies as of 07/02/2018      Reactions   Prednisone Nausea And Vomiting      Medication List    TAKE these medications   acetaminophen 500 MG tablet Commonly known as:  TYLENOL Take 1,000 mg by mouth every 8 (eight) hours as needed for headache.   cyclobenzaprine 5 MG tablet Commonly known as:  FLEXERIL Take 1 tablet (5 mg total) by mouth 3 (three) times daily as needed for muscle spasms.   ferrous sulfate 325 (65 FE) MG tablet Take 1 tablet (325 mg total) by mouth daily with breakfast. What changed:  when to take this   ibuprofen 600 MG tablet Commonly known as:  ADVIL,MOTRIN Take 1 tablet (600 mg total) by mouth every 6 (six) hours as needed for mild pain or moderate pain.   loratadine 10 MG tablet Commonly known as:  CLARITIN Take 1 tablet (10 mg total) by mouth daily. What changed:    when to take this  reasons to take this   Prenatal Vitamins 0.8 MG tablet Take 1 tablet by mouth daily.   albuterol (2.5 MG/3ML) 0.083% nebulizer solution Commonly known as:  PROVENTIL Take 3 mLs (2.5 mg total) by nebulization every 4 (four) hours as needed for wheezing or shortness of breath.   PROAIR HFA 108 (90 Base) MCG/ACT inhaler Generic drug:   albuterol Inhale 2 puffs into the lungs every 4 (four) hours as needed for wheezing or shortness of breath. For shortness of breath/wheezing       Diet: routine diet  Activity: Advance as tolerated. Pelvic rest for 6 weeks.   Outpatient follow up:2 weeks for postpartum depression assessment, 6 weeks for postpartum visit and Nexplanon insertion Follow up Appt:No future appointments. Follow up Visit:No follow-ups on file.  Postpartum contraception: Nexplanon  Newborn Data: Live born female  Birth Weight: 7 lb 11.5 oz (3501 g) APGAR: 8, 9  Newborn Delivery   Birth date/time:  06/30/2018 20:45:00 Delivery type:  Vaginal, Spontaneous     Baby Feeding: Bottle and Breast Disposition:home with mother   07/02/2018 Janeece Riggers, CNM

## 2018-07-01 NOTE — Lactation Note (Signed)
This note was copied from a baby's chart. Lactation Consultation Note  Patient Name: Margaret Alto DenverRaven Okubo WUJWJ'XToday's Date: 07/01/2018 Reason for consult: Initial assessment;Term Mom chooses to pump and bottle feed.  She is also supplementing with formula per bottle.  [facility] pump is set up and mom is pumping every 3 hours.  She is obtaining drops of colostrum.  No concerns at present time.  Encouraged to call out for assist prn.  Maternal Data    Feeding Feeding Type: Bottle Fed - Formula  LATCH Score                   Interventions    Lactation Tools Discussed/Used     Consult Status Consult Status: PRN    Huston FoleyMOULDEN, Thanos Cousineau S 07/01/2018, 6:14 PM

## 2018-07-02 ENCOUNTER — Encounter (HOSPITAL_COMMUNITY): Payer: Self-pay | Admitting: Anesthesiology

## 2018-07-02 MED ORDER — MEASLES, MUMPS & RUBELLA VAC ~~LOC~~ INJ
0.5000 mL | INJECTION | Freq: Once | SUBCUTANEOUS | Status: AC
  Administered 2018-07-02: 0.5 mL via SUBCUTANEOUS
  Filled 2018-07-02: qty 0.5

## 2018-07-02 MED ORDER — IBUPROFEN 600 MG PO TABS: 600.0000 mg | ORAL_TABLET | Freq: Four times a day (QID) | ORAL | 0 refills | Status: DC | PRN

## 2018-07-02 NOTE — Progress Notes (Signed)
MOB was referred for history of depression/anxiety. * Referral screened out by Clinical Social Worker because none of the following criteria appear to apply: ~ History of anxiety/depression during this pregnancy, or of post-partum depression following prior delivery. ~ Diagnosis of anxiety and/or depression within last 3 years OR * MOB's symptoms currently being treated with medication and/or therapy. Please contact the Clinical Social Worker if needs arise, by MOB request, or if MOB scores greater than 9/yes to question 10 on Edinburgh Postpartum Depression Screen.  [name], MSW, LCSW Clinical Social Work [phone]  

## 2018-07-02 NOTE — Lactation Note (Signed)
This note was copied from a baby's chart. Lactation Consultation Note  Patient Name: Margaret Cline Date: 07/02/2018 Reason for consult: Follow-up assessment;Term;Hyperbilirubinemia  P3 mother whose infant is now 3436 hours old  Mother's feeding plan is to pump and bottle feed.  She is giving formula supplement because she has only obtained colostrum drops so far with pumping.  I encouraged her to continue pumping every 3 hours today.  Her RN had changed the flange size from a #24 to a #27.  Mother denies pain with pumping.  Mother will not be discharged today due to baby starting phototherapy early this morning.  I encouraged her to call for any questions/concerns.   Maternal Data Formula Feeding for Exclusion: No Has patient been taught Hand Expression?: Yes  Feeding    LATCH Score                   Interventions    Lactation Tools Discussed/Used Initiated by:: Already set up at bedside   Consult Status Consult Status: Follow-up Date: 07/03/18 Follow-up type: In-patient    Dazha Kempa R Payden Bonus 07/02/2018, 9:09 AM

## 2018-07-02 NOTE — Progress Notes (Signed)
C/o back pain and R leg weakness/pain.  Vaginal delivery and epidural on 9/16.   Back is sore and R anterior thigh pain. Steady. Subjective weakness with ambulation.  On exam strength is intact 4-5/5 at quads and knee and foot. Epidural site is clean, non erythematous, soft.   Long discussion with patient about nerve irritation from epidural catheter vs labor nerve stretching injury. I have stressed that if her symptoms persist or worsen, including spreading or progressive weakness, incontinence or worsening pain she is to immediately come to [facility] or ER or call anesthesia or her OB. With current symptoms I feel low likelihood of epidural hematoma or abscess at this time. Patient OK for discharge and appears to understand.  [employer] CorporationCar[employer]

## 2018-07-03 ENCOUNTER — Ambulatory Visit: Payer: Self-pay

## 2018-07-03 NOTE — Lactation Note (Signed)
This note was copied from a baby's chart. Lactation Consultation Note  Patient Name: Margaret Cline: 07/03/2018 Reason for consult: Follow-up assessment;Term  P3 mother whose infant is now 459 hours old  Mother has been exclusively bottle feeding.  She is pumping and not getting much colostrum yet.  Encouraged to continue pumping on a regular basis and to include hand expression with pumping.  Mother wants to pump and bottle feed.  [facility] but will not have her own personal pump until that appointment.  She has a family member who she will contact to see if she can borrow her DEBP until her appointment.    Mother will inform me later today if she will be discharged.  Baby is getting a serum bilirubin level drawn now.   Maternal Data Formula Feeding for Exclusion: No Has patient been taught Hand Expression?: Yes Does the patient have breastfeeding experience prior to this delivery?: No  Feeding Feeding Type: Bottle Fed - Formula Nipple Type: Slow - flow  LATCH Score                   Interventions    Lactation Tools Discussed/Used WIC Program: Yes   Consult Status Consult Status: Complete Cline: 07/03/18 Follow-up type: In-patient    Angi Goodell R Ibraham Levi 07/03/2018, 8:02 AM

## 2018-10-07 ENCOUNTER — Encounter (HOSPITAL_COMMUNITY): Payer: Self-pay | Admitting: *Deleted

## 2018-10-07 ENCOUNTER — Ambulatory Visit (HOSPITAL_COMMUNITY)
Admission: EM | Admit: 2018-10-07 | Discharge: 2018-10-07 | Disposition: A | Discharge status: Home / Self Care | Payer: Medicaid Other | Attending: Family Medicine | Admitting: Family Medicine

## 2018-10-07 DIAGNOSIS — E669 Obesity, unspecified: Secondary | ICD-10-CM | POA: Diagnosis not present

## 2018-10-07 DIAGNOSIS — D649 Anemia, unspecified: Secondary | ICD-10-CM | POA: Insufficient documentation

## 2018-10-07 DIAGNOSIS — Z6833 Body mass index (BMI) 33.0-33.9, adult: Secondary | ICD-10-CM | POA: Diagnosis not present

## 2018-10-07 DIAGNOSIS — N760 Acute vaginitis: Secondary | ICD-10-CM | POA: Diagnosis not present

## 2018-10-07 DIAGNOSIS — G8929 Other chronic pain: Secondary | ICD-10-CM | POA: Diagnosis not present

## 2018-10-07 DIAGNOSIS — Z818 Family history of other mental and behavioral disorders: Secondary | ICD-10-CM | POA: Insufficient documentation

## 2018-10-07 DIAGNOSIS — N898 Other specified noninflammatory disorders of vagina: Secondary | ICD-10-CM

## 2018-10-07 DIAGNOSIS — Z833 Family history of diabetes mellitus: Secondary | ICD-10-CM | POA: Insufficient documentation

## 2018-10-07 DIAGNOSIS — G43909 Migraine, unspecified, not intractable, without status migrainosus: Secondary | ICD-10-CM | POA: Diagnosis not present

## 2018-10-07 DIAGNOSIS — J45909 Unspecified asthma, uncomplicated: Secondary | ICD-10-CM | POA: Diagnosis not present

## 2018-10-07 DIAGNOSIS — F339 Major depressive disorder, recurrent, unspecified: Secondary | ICD-10-CM | POA: Diagnosis not present

## 2018-10-07 DIAGNOSIS — Z87891 Personal history of nicotine dependence: Secondary | ICD-10-CM | POA: Insufficient documentation

## 2018-10-07 DIAGNOSIS — Z79899 Other long term (current) drug therapy: Secondary | ICD-10-CM | POA: Insufficient documentation

## 2018-10-07 DIAGNOSIS — Z811 Family history of alcohol abuse and dependence: Secondary | ICD-10-CM | POA: Insufficient documentation

## 2018-10-07 DIAGNOSIS — Z888 Allergy status to other drugs, medicaments and biological substances status: Secondary | ICD-10-CM | POA: Diagnosis not present

## 2018-10-07 DIAGNOSIS — Z813 Family history of other psychoactive substance abuse and dependence: Secondary | ICD-10-CM | POA: Insufficient documentation

## 2018-10-07 MED ORDER — FLUCONAZOLE 150 MG PO TABS: 150.0000 mg | ORAL_TABLET | Freq: Once | ORAL | 0 refills | Status: AC

## 2018-10-07 NOTE — Discharge Instructions (Signed)
I have sent in Diflucan to treat you for yeast.  Please take 1 tablet today.  May repeat in 2 to 3 days if symptoms still persisting.  We will call you with results of the swab.  We are testing you for Gonorrhea, Chlamydia, Trichomonas, Yeast and Bacterial Vaginosis. We will call you if anything is positive and let you know if you require any further treatment. Please inform partners of any positive results.   Please return if symptoms not improving with treatment, development of fever, nausea, vomiting, abdominal pain.

## 2018-10-07 NOTE — ED Provider Notes (Signed)
[facility]    CSN: [id number] Arrival date & time: 10/07/18  1023     History   Chief Complaint Chief Complaint  Patient presents with  . Vaginal Discharge    HPI Margaret Cline is a 27 y.o. female history of previous positive STDs, presenting today for evaluation of vaginal discharge and itching.  Patient symptoms have been going on since Saturday, for the past 3 days.  She states that she has a history of getting recurrent yeast and BV.  Symptoms feel more like yeast, but is unsure if it could possibly be BV as well.  States that she is having a significant amount of discharge.  She has tried over-the-counter Monistat without relief.  Also menstrual period was 1 week ago.  Uses Nexplanon.  HPI  Past Medical History:  Diagnosis Date  . Anemia    Fe x2  . Asthma   . Chlamydia 2008  . Chronic pain   . Constipation, chronic 11/13/11  . Depression    postpartum - no meds  . Gonorrhea   . History of chlamydia 03/07/2012  . History of trichomoniasis 03/07/2012  . Hx: UTI (urinary tract infection)    Frequently  . Irregular periods/menstrual cycles   . Migraines    Migraines - no med   . Obese   . Trichomonas 2008  . Vaginal delivery--shoulder dystocia 07/10/2012    Patient Active Problem List   Diagnosis Date Noted  . 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 Chronic migraine without aura with status migrainosus, not intractable 02/23/2015  . Persistent headaches 02/23/2015  . Cigarette nicotine dependence without complication 02/23/2015  . BMI 33.0-33.9,adult 02/23/2015  . Major depression, recurrent (HCC) 08/23/2013  . Suicide attempt by multiple drug overdose 08/23/2013  . Asthma 03/20/2012  . Obesity 03/07/2012  . Migraine 03/07/2012    Past Surgical History:  Procedure Laterality Date  . INDUCED ABORTION    . WISDOM TOOTH EXTRACTION      OB History    Gravida  5   Para  3   Term  3   Preterm  0   AB  2   Living  3     SAB  1   TAB  1   Ectopic  0   Multiple  0   Live Births  3            Home Medications    Prior to Admission medications   Medication Sig Start Date End Date Taking? Authorizing Provider  SERTRALINE HCL PO Take by mouth.   Yes [provider]  acetaminophen (TYLENOL) 500 MG tablet Take 1,000 mg by mouth every 8 (eight) hours as needed for headache.    [provider]  albuterol (PROAIR HFA) 108 (90 BASE) MCG/ACT inhaler Inhale 2 puffs into the lungs every 4 (four) hours as needed for wheezing or shortness of breath. For shortness of breath/wheezing    [provider]  albuterol (PROVENTIL) (2.5 MG/3ML) 0.083% nebulizer solution Take 3 mLs (2.5 mg total) by nebulization every 4 (four) hours as needed for wheezing or shortness of breath. 09/06/15   Street, [place], PA-C  cyclobenzaprine (FLEXERIL) 5 MG tablet Take 1 tablet (5 mg total) by mouth 3 (three) times daily as needed for muscle spasms. 02/11/18   Prothero, Henderson NewcomerNancy Jean, CNM  ferrous sulfate 325 (65 FE) MG tablet Take 1 tablet (325 mg total) by mouth daily with breakfast. Patient taking differently: Take 325 mg by mouth 2 (  two) times daily with a meal.  03/08/18   Kooistra, Charlesetta GaribaldiKathryn Lorraine, CNM  fluconazole (DIFLUCAN) 150 MG tablet Take 1 tablet (150 mg total) by mouth once for 1 dose. 10/07/18 10/07/18  Wieters, Hallie C, PA-C  ibuprofen (ADVIL,MOTRIN) 600 MG tablet Take 1 tablet (600 mg total) by mouth every 6 (six) hours as needed for mild pain or moderate pain. 07/02/18   Janeece RiggersGreer, Ellis K, CNM  loratadine (CLARITIN) 10 MG tablet Take 1 tablet (10 mg total) by mouth daily. Patient taking differently: Take 10 mg daily as needed by mouth for allergies or rhinitis.  05/29/17   Kellie ShropshireShrosbree, Emily J, PA-C  Prenatal Multivit-Min-Fe-FA (PRENATAL VITAMINS) 0.8 MG tablet Take 1 tablet by mouth daily. 11/13/17   Dorathy KinsmanSmith, [place], CNM    Family History Family History  Problem Relation Age of Onset  . Hypertension  Mother   . Heart disease Mother   . Diabetes Mother   . Stroke Mother   . Hyperlipidemia Mother   . Hypertension Sister   . Thrombophlebitis Sister        clotting issue  . Depression Sister   . Heart murmur Sister   . Heart disease Maternal Grandfather   . Drug abuse Father   . Alcohol abuse Maternal Uncle   . Kidney disease Maternal Uncle        failure  . Rheum arthritis Sister        arthritis vs fibromyalgia  . Fibromyalgia Sister   . Depression Sister   . Hypertension Sister   . Anesthesia problems Neg Hx   . Hypotension Neg Hx   . Malignant hyperthermia Neg Hx   . Pseudochol deficiency Neg Hx     Social History Social History   Tobacco Use  . Smoking status: Former Smoker    Packs/day: 0.25    Types: Cigarettes  . Smokeless tobacco: Never Used  . Tobacco comment: not since pregnancy  Substance Use Topics  . Alcohol use: Not Currently  . Drug use: Never     Allergies   Prednisone   Review of Systems Review of Systems  Constitutional: Negative for fever.  Respiratory: Negative for shortness of breath.   Cardiovascular: Negative for chest pain.  Gastrointestinal: Negative for abdominal pain, diarrhea, nausea and vomiting.  Genitourinary: Positive for vaginal discharge. Negative for dysuria, flank pain, genital sores, hematuria, menstrual problem, vaginal bleeding and vaginal pain.  Musculoskeletal: Negative for back pain.  Skin: Negative for rash.  Neurological: Negative for dizziness, light-headedness and headaches.     Physical Exam Triage Vital Signs ED Triage Vitals [10/07/18 1105]  Enc Vitals Group     BP (!) 131/98     Pulse Rate 70     Resp 16     Temp 98.4 F (36.9 C)     Temp Source Oral     SpO2 100 %     Weight      Height      Head Circumference      Peak Flow      Pain Score 0     Pain Loc      Pain Edu?      Excl. in GC?    No data found.  Updated Vital Signs BP (!) 131/98 Comment: "has been elevated since I had my  baby"  Pulse 70   Temp 98.4 F (36.9 C) (Oral)   Resp 16   LMP 09/30/2018   SpO2 100%   Breastfeeding No   Visual Acuity Right  Eye Distance:   Left Eye Distance:   Bilateral Distance:    Right Eye Near:   Left Eye Near:    Bilateral Near:     Physical Exam Vitals signs and nursing note reviewed.  Constitutional:      Appearance: She is well-developed.     Comments: No acute distress  HENT:     Head: Normocephalic and atraumatic.     Nose: Nose normal.  Eyes:     Conjunctiva/sclera: Conjunctivae normal.  Neck:     Musculoskeletal: Neck supple.  Cardiovascular:     Rate and Rhythm: Normal rate.  Pulmonary:     Effort: Pulmonary effort is normal. No respiratory distress.  Abdominal:     General: There is no distension.     Comments: Abdomen soft, nondistended, nontender to light deep palpation throughout entire abdomen  Genitourinary:    Comments: Deferred Musculoskeletal: Normal range of motion.  Skin:    General: Skin is warm and dry.  Neurological:     Mental Status: She is alert and oriented to person, place, and time.      UC Treatments / Results  Labs (all labs ordered are listed, but only abnormal results are displayed) Labs Reviewed  CERVICOVAGINAL ANCILLARY ONLY    EKG None  Radiology No results found.  Procedures Procedures (including critical care time)  Medications Ordered in UC Medications - No data to display  Initial Impression / Assessment and Plan / UC Course  I have reviewed the triage vital signs and the nursing notes.  Pertinent labs & imaging results that were available during my care of the patient were reviewed by me and considered in my medical decision making (see chart for details).     Vaginal swab obtained will send off to check for causes of discharge.  Will empirically treat for yeast today.  Will alter treatment if needed.Discussed strict return precautions. Patient verbalized understanding and is agreeable with  plan.  Final Clinical Impressions(s) / UC Diagnoses   Final diagnoses:  Vaginal discharge  Acute vaginitis     Discharge Instructions     I have sent in Diflucan to treat you for yeast.  Please take 1 tablet today.  May repeat in 2 to 3 days if symptoms still persisting.  We will call you with results of the swab.  We are testing you for Gonorrhea, Chlamydia, Trichomonas, Yeast and Bacterial Vaginosis. We will call you if anything is positive and let you know if you require any further treatment. Please inform partners of any positive results.   Please return if symptoms not improving with treatment, development of fever, nausea, vomiting, abdominal pain.    ED Prescriptions    Medication Sig Dispense Auth. Provider   fluconazole (DIFLUCAN) 150 MG tablet Take 1 tablet (150 mg total) by mouth once for 1 dose. 2 tablet Wieters, Hallie C, PA-C     Controlled Substance Prescriptions [place] Controlled Substance Registry consulted? Not Applicable   Lew Dawes, [place] 10/07/18 1158

## 2018-10-07 NOTE — ED Triage Notes (Signed)
Reports vaginal discharge and pruritis x 3 days.  Has tried OTC Monistat without relief.

## 2018-10-09 LAB — CERVICOVAGINAL ANCILLARY ONLY
BACTERIAL VAGINITIS: POSITIVE — AB
CHLAMYDIA, DNA PROBE: NEGATIVE
Candida vaginitis: NEGATIVE
Neisseria Gonorrhea: NEGATIVE
Trichomonas: POSITIVE — AB

## 2018-10-10 ENCOUNTER — Telehealth: Payer: Self-pay | Admitting: Emergency Medicine

## 2018-10-10 ENCOUNTER — Telehealth (HOSPITAL_COMMUNITY): Payer: Self-pay | Admitting: Emergency Medicine

## 2018-10-10 MED ORDER — METRONIDAZOLE 500 MG PO TABS: 500.0000 mg | ORAL_TABLET | Freq: Two times a day (BID) | ORAL | 0 refills | Status: AC

## 2018-10-10 NOTE — Telephone Encounter (Signed)
Positive for Trich and BV. Metronidazole sent. Will call pt to inform

## 2018-10-10 NOTE — Telephone Encounter (Signed)
Re routed to new phramacy

## 2018-10-16 ENCOUNTER — Telehealth (HOSPITAL_COMMUNITY): Payer: Self-pay | Admitting: Family Medicine

## 2018-10-16 MED ORDER — METRONIDAZOLE 500 MG PO TABS: 500.0000 mg | ORAL_TABLET | Freq: Two times a day (BID) | ORAL | 0 refills | Status: AC

## 2018-10-16 NOTE — Telephone Encounter (Signed)
Left flagyl out of town. Had four days left.

## 2018-12-07 ENCOUNTER — Other Ambulatory Visit: Payer: Self-pay

## 2018-12-07 ENCOUNTER — Encounter (HOSPITAL_COMMUNITY): Payer: Self-pay | Admitting: Emergency Medicine

## 2018-12-07 ENCOUNTER — Emergency Department (HOSPITAL_COMMUNITY)
Admission: EM | Admit: 2018-12-07 | Discharge: 2018-12-07 | Disposition: A | Discharge status: Home / Self Care | Payer: Medicaid Other | Attending: Emergency Medicine | Admitting: Emergency Medicine

## 2018-12-07 DIAGNOSIS — Z87891 Personal history of nicotine dependence: Secondary | ICD-10-CM | POA: Insufficient documentation

## 2018-12-07 DIAGNOSIS — H6692 Otitis media, unspecified, left ear: Secondary | ICD-10-CM | POA: Insufficient documentation

## 2018-12-07 DIAGNOSIS — J45909 Unspecified asthma, uncomplicated: Secondary | ICD-10-CM | POA: Insufficient documentation

## 2018-12-07 DIAGNOSIS — H6690 Otitis media, unspecified, unspecified ear: Secondary | ICD-10-CM

## 2018-12-07 DIAGNOSIS — Z79899 Other long term (current) drug therapy: Secondary | ICD-10-CM | POA: Insufficient documentation

## 2018-12-07 DIAGNOSIS — J029 Acute pharyngitis, unspecified: Secondary | ICD-10-CM

## 2018-12-07 LAB — GROUP A STREP BY PCR: Group A Strep by PCR: NOT DETECTED

## 2018-12-07 MED ORDER — LIDOCAINE VISCOUS HCL 2 % MT SOLN
15.0000 mL | Freq: Once | OROMUCOSAL | Status: AC
  Administered 2018-12-07: 15 mL via OROMUCOSAL
  Filled 2018-12-07: qty 15

## 2018-12-07 MED ORDER — PENICILLIN G BENZATHINE 1200000 UNIT/2ML IM SUSP
1.2000 10*6.[IU] | Freq: Once | INTRAMUSCULAR | Status: AC
  Administered 2018-12-07: 1.2 10*6.[IU] via INTRAMUSCULAR
  Filled 2018-12-07: qty 2

## 2018-12-07 MED ORDER — KETOROLAC TROMETHAMINE 30 MG/ML IJ SOLN
30.0000 mg | Freq: Once | INTRAMUSCULAR | Status: AC
  Administered 2018-12-07: 30 mg via INTRAMUSCULAR
  Filled 2018-12-07: qty 1

## 2018-12-07 NOTE — Discharge Instructions (Addendum)
Alternate tylenol and ibuprofen for pain.

## 2018-12-07 NOTE — ED Triage Notes (Signed)
C/o sore throat since yesterday with swelling to L side of throat.  Also c/o L ear pain.

## 2018-12-07 NOTE — ED Provider Notes (Signed)
[facility] EMERGENCY DEPARTMENT Provider Note   CSN: [id number] Arrival date & time: 12/07/18  1958    History   Chief Complaint Chief Complaint  Patient presents with  . Sore Throat    HPI Margaret Cline is a 28 y.o. female.     Pt presents to the ED today with sore throat and left ear pain.  She said that it hurts to swallow, so she has not been eating or drinking much.  She does not know if she's had a fever.     Past Medical History:  Diagnosis Date  . Anemia    Fe x2  . Asthma   . Chlamydia 2008  . Chronic pain   . Constipation, chronic 11/13/11  . Depression    postpartum - no meds  . Gonorrhea   . History of chlamydia 03/07/2012  . History of trichomoniasis 03/07/2012  . Hx: UTI (urinary tract infection)    Frequently  . Irregular periods/menstrual cycles   . Migraines    Migraines - no med   . Obese   . Trichomonas 2008  . Vaginal delivery--shoulder dystocia 07/10/2012    Patient Active Problem List   Diagnosis Date Noted  . 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 Chronic migraine without aura with status migrainosus, not intractable 02/23/2015  . Persistent headaches 02/23/2015  . Cigarette nicotine dependence without complication 02/23/2015  . BMI 33.0-33.9,adult 02/23/2015  . Major depression, recurrent (HCC) 08/23/2013  . Suicide attempt by multiple drug overdose 08/23/2013  . Asthma 03/20/2012  . Obesity 03/07/2012  . Migraine 03/07/2012    Past Surgical History:  Procedure Laterality Date  . INDUCED ABORTION    . WISDOM TOOTH EXTRACTION       OB History    Gravida  5   Para  3   Term  3   Preterm  0   AB  2   Living  3     SAB  1   TAB  1   Ectopic  0   Multiple  0   Live Births  3            Home Medications    Prior to Admission medications   Medication Sig Start Date End Date Taking? Authorizing Provider  acetaminophen (TYLENOL) 500 MG tablet Take 1,000 mg by mouth every 8  (eight) hours as needed for headache.    [provider]  albuterol (PROAIR HFA) 108 (90 BASE) MCG/ACT inhaler Inhale 2 puffs into the lungs every 4 (four) hours as needed for wheezing or shortness of breath. For shortness of breath/wheezing    [provider]  albuterol (PROVENTIL) (2.5 MG/3ML) 0.083% nebulizer solution Take 3 mLs (2.5 mg total) by nebulization every 4 (four) hours as needed for wheezing or shortness of breath. 09/06/15   Street, [place], PA-C  cyclobenzaprine (FLEXERIL) 5 MG tablet Take 1 tablet (5 mg total) by mouth 3 (three) times daily as needed for muscle spasms. 02/11/18   Prothero, Henderson Newcomer, CNM  ferrous sulfate 325 (65 FE) MG tablet Take 1 tablet (325 mg total) by mouth daily with breakfast. Patient taking differently: Take 325 mg by mouth 2 (two) times daily with a meal.  03/08/18   Kooistra, Charlesetta Garibaldi, CNM  ibuprofen (ADVIL,MOTRIN) 600 MG tablet Take 1 tablet (600 mg total) by mouth every 6 (six) hours as needed for mild pain or moderate pain. 07/02/18   Janeece Riggers, CNM  loratadine (CLARITIN) 10 MG  tablet Take 1 tablet (10 mg total) by mouth daily. Patient taking differently: Take 10 mg daily as needed by mouth for allergies or rhinitis.  05/29/17   Kellie Shropshire, PA-C  Prenatal Multivit-Min-Fe-FA (PRENATAL VITAMINS) 0.8 MG tablet Take 1 tablet by mouth daily. 11/13/17   Katrinka Blazing, [place], CNM  SERTRALINE HCL PO Take by mouth.    [provider]    Family History Family History  Problem Relation Age of Onset  . Hypertension Mother   . Heart disease Mother   . Diabetes Mother   . Stroke Mother   . Hyperlipidemia Mother   . Hypertension Sister   . Thrombophlebitis Sister        clotting issue  . Depression Sister   . Heart murmur Sister   . Heart disease Maternal Grandfather   . Drug abuse Father   . Alcohol abuse Maternal Uncle   . Kidney disease Maternal Uncle        failure  . Rheum arthritis Sister        arthritis  vs fibromyalgia  . Fibromyalgia Sister   . Depression Sister   . Hypertension Sister   . Anesthesia problems Neg Hx   . Hypotension Neg Hx   . Malignant hyperthermia Neg Hx   . Pseudochol deficiency Neg Hx     Social History Social History   Tobacco Use  . Smoking status: Former Smoker    Packs/day: 0.25    Types: Cigarettes  . Smokeless tobacco: Never Used  . Tobacco comment: not since pregnancy  Substance Use Topics  . Alcohol use: Not Currently  . Drug use: Never     Allergies   Prednisone   Review of Systems Review of Systems  HENT: Positive for ear pain, rhinorrhea and sore throat.   All other systems reviewed and are negative.    Physical Exam Updated Vital Signs BP (!) 134/98 (BP Location: Right Arm)   Pulse 94   Temp 99.1 F (37.3 C) (Oral)   Resp 18   SpO2 99%   Physical Exam Vitals signs and nursing note reviewed.  Constitutional:      Appearance: She is well-developed.  HENT:     Head: Normocephalic and atraumatic.     Right Ear: Ear canal normal.     Left Ear: A middle ear effusion is present. Tympanic membrane is erythematous.     Mouth/Throat:     Mouth: Mucous membranes are moist. Mucous membranes are pale.     Tonsils: Tonsillar exudate present. No tonsillar abscesses.  Eyes:     Conjunctiva/sclera: Conjunctivae normal.     Pupils: Pupils are equal, round, and reactive to light.  Neck:     Musculoskeletal: Normal range of motion and neck supple.  Cardiovascular:     Rate and Rhythm: Normal rate and regular rhythm.  Pulmonary:     Effort: Pulmonary effort is normal.     Breath sounds: Normal breath sounds.  Abdominal:     General: Bowel sounds are normal.     Palpations: Abdomen is soft.  Lymphadenopathy:     Cervical: Cervical adenopathy present.  Skin:    General: Skin is warm.     Capillary Refill: Capillary refill takes less than 2 seconds.  Neurological:     General: No focal deficit present.     Mental Status: She is  alert and oriented to person, place, and time.  Psychiatric:        Mood and Affect: Mood normal.  Behavior: Behavior normal.      ED Treatments / Results  Labs (all labs ordered are listed, but only abnormal results are displayed) Labs Reviewed  GROUP A STREP BY PCR    EKG None  Radiology No results found.  Procedures Procedures (including critical care time)  Medications Ordered in ED Medications  penicillin g benzathine (BICILLIN LA) 1200000 UNIT/2ML injection 1.2 Million Units (1.2 Million Units Intramuscular Given 12/07/18 2230)  ketorolac (TORADOL) 30 MG/ML injection 30 mg (30 mg Intramuscular Given 12/07/18 2230)  lidocaine (XYLOCAINE) 2 % viscous mouth solution 15 mL (15 mLs Mouth/Throat Given 12/07/18 2233)     Initial Impression / Assessment and Plan / ED Course  I have reviewed the triage vital signs and the nursing notes.  Pertinent labs & imaging results that were available during my care of the patient were reviewed by me and considered in my medical decision making (see chart for details).     Pt's strep is negative, but her tonsils are swollen with exudate and she has an OM on the left, so I'll treat her for pharyngitis.  She requests an IM med, so she will be given bicillin.  Pt knows to return if worse.  F/u with pcp.  Final Clinical Impressions(s) / ED Diagnoses   Final diagnoses:  Pharyngitis, unspecified etiology  Acute otitis media, unspecified otitis media type    ED Discharge Orders    None       Jacalyn Lefevre, MD 12/07/18 2237

## 2018-12-08 ENCOUNTER — Other Ambulatory Visit: Payer: Self-pay

## 2018-12-08 ENCOUNTER — Encounter (HOSPITAL_COMMUNITY): Payer: Self-pay | Admitting: Emergency Medicine

## 2018-12-08 ENCOUNTER — Emergency Department (HOSPITAL_COMMUNITY)
Admission: EM | Admit: 2018-12-08 | Discharge: 2018-12-08 | Disposition: A | Discharge status: Home / Self Care | Payer: Medicaid Other | Attending: Emergency Medicine | Admitting: Emergency Medicine

## 2018-12-08 DIAGNOSIS — Z87891 Personal history of nicotine dependence: Secondary | ICD-10-CM | POA: Insufficient documentation

## 2018-12-08 DIAGNOSIS — J029 Acute pharyngitis, unspecified: Secondary | ICD-10-CM | POA: Insufficient documentation

## 2018-12-08 DIAGNOSIS — J45909 Unspecified asthma, uncomplicated: Secondary | ICD-10-CM | POA: Insufficient documentation

## 2018-12-08 DIAGNOSIS — Z79899 Other long term (current) drug therapy: Secondary | ICD-10-CM | POA: Insufficient documentation

## 2018-12-08 MED ORDER — SODIUM CHLORIDE 0.9 % IV BOLUS
1000.0000 mL | Freq: Once | INTRAVENOUS | Status: AC
  Administered 2018-12-08: 1000 mL via INTRAVENOUS

## 2018-12-08 MED ORDER — KETOROLAC TROMETHAMINE 30 MG/ML IJ SOLN
15.0000 mg | Freq: Once | INTRAMUSCULAR | Status: AC
  Administered 2018-12-08: 15 mg via INTRAVENOUS
  Filled 2018-12-08: qty 1

## 2018-12-08 MED ORDER — DEXAMETHASONE SODIUM PHOSPHATE 10 MG/ML IJ SOLN
10.0000 mg | Freq: Once | INTRAMUSCULAR | Status: AC
  Administered 2018-12-08: 10 mg via INTRAVENOUS
  Filled 2018-12-08: qty 1

## 2018-12-08 NOTE — ED Notes (Signed)
Gave pt water and Coke. Tolerating well a this time.

## 2018-12-08 NOTE — ED Provider Notes (Signed)
[facility] Note   CSN: [id number] Arrival date & time: 12/08/18  1005    History   Chief Complaint Chief Complaint  Patient presents with  . Sore Throat    HPI Margaret Cline is a 28 y.o. female.     The history is provided by the patient and medical records. No language interpreter was used.   Margaret Cline is a 28 year old female who presents emergency department complaining of persistent sore throat.  She is still having dysphasia and has not been eating or drinking anything.  Denies any fevers.  She was seen in the emergency department last night where she tested negative for strep, however given presentation of exudates on tonsils, was treated with Bicillin.  She was also given lidocaine and Toradol in the ER.  When she awoke this morning, she felt as if her throat was worse.  She still is unable to swallow.  She has been intermittently keeping her own saliva down, however will spit just because it hurts to swallow.  Past Medical History:  Diagnosis Date  . Anemia    Fe x2  . Asthma   . Chlamydia 2008  . Chronic pain   . Constipation, chronic 11/13/11  . Depression    postpartum - no meds  . Gonorrhea   . History of chlamydia 03/07/2012  . History of trichomoniasis 03/07/2012  . Hx: UTI (urinary tract infection)    Frequently  . Irregular periods/menstrual cycles   . Migraines    Migraines - no med   . Obese   . Trichomonas 2008  . Vaginal delivery--shoulder dystocia 07/10/2012    Patient Active Problem List   Diagnosis Date Noted  . 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 Chronic migraine without aura with status migrainosus, not intractable 02/23/2015  . Persistent headaches 02/23/2015  . Cigarette nicotine dependence without complication 02/23/2015  . BMI 33.0-33.9,adult 02/23/2015  . Major depression, recurrent (HCC) 08/23/2013  . Suicide attempt by multiple drug overdose 08/23/2013  . Asthma  03/20/2012  . Obesity 03/07/2012  . Migraine 03/07/2012    Past Surgical History:  Procedure Laterality Date  . INDUCED ABORTION    . WISDOM TOOTH EXTRACTION       OB History    Gravida  5   Para  3   Term  3   Preterm  0   AB  2   Living  3     SAB  1   TAB  1   Ectopic  0   Multiple  0   Live Births  3            Home Medications    Prior to Admission medications   Medication Sig Start Date End Date Taking? Authorizing Provider  acetaminophen (TYLENOL) 500 MG tablet Take 1,000 mg by mouth every 8 (eight) hours as needed for headache.    [provider]  albuterol (PROAIR HFA) 108 (90 BASE) MCG/ACT inhaler Inhale 2 puffs into the lungs every 4 (four) hours as needed for wheezing or shortness of breath. For shortness of breath/wheezing    [provider]  albuterol (PROVENTIL) (2.5 MG/3ML) 0.083% nebulizer solution Take 3 mLs (2.5 mg total) by nebulization every 4 (four) hours as needed for wheezing or shortness of breath. 09/06/15   Street, FowlertonMercedes, PA-C  cyclobenzaprine (FLEXERIL) 5 MG tablet Take 1 tablet (5 mg total) by mouth 3 (three) times daily as needed for muscle spasms. 02/11/18  Prothero, Henderson Newcomer, CNM  ferrous sulfate 325 (65 FE) MG tablet Take 1 tablet (325 mg total) by mouth daily with breakfast. Patient taking differently: Take 325 mg by mouth 2 (two) times daily with a meal.  03/08/18   Kooistra, Charlesetta Garibaldi, CNM  ibuprofen (ADVIL,MOTRIN) 600 MG tablet Take 1 tablet (600 mg total) by mouth every 6 (six) hours as needed for mild pain or moderate pain. 07/02/18   Janeece Riggers, CNM  loratadine (CLARITIN) 10 MG tablet Take 1 tablet (10 mg total) by mouth daily. Patient taking differently: Take 10 mg daily as needed by mouth for allergies or rhinitis.  05/29/17   Kellie Shropshire, PA-C  Prenatal Multivit-Min-Fe-FA (PRENATAL VITAMINS) 0.8 MG tablet Take 1 tablet by mouth daily. 11/13/17   Katrinka Blazing, [place], CNM  SERTRALINE HCL  PO Take by mouth.    [provider]    Family History Family History  Problem Relation Age of Onset  . Hypertension Mother   . Heart disease Mother   . Diabetes Mother   . Stroke Mother   . Hyperlipidemia Mother   . Hypertension Sister   . Thrombophlebitis Sister        clotting issue  . Depression Sister   . Heart murmur Sister   . Heart disease Maternal Grandfather   . Drug abuse Father   . Alcohol abuse Maternal Uncle   . Kidney disease Maternal Uncle        failure  . Rheum arthritis Sister        arthritis vs fibromyalgia  . Fibromyalgia Sister   . Depression Sister   . Hypertension Sister   . Anesthesia problems Neg Hx   . Hypotension Neg Hx   . Malignant hyperthermia Neg Hx   . Pseudochol deficiency Neg Hx     Social History Social History   Tobacco Use  . Smoking status: Former Smoker    Packs/day: 0.25    Types: Cigarettes  . Smokeless tobacco: Never Used  . Tobacco comment: not since pregnancy  Substance Use Topics  . Alcohol use: Not Currently  . Drug use: Never     Allergies   Prednisone   Review of Systems Review of Systems  Constitutional: Negative for fever.  HENT: Positive for sore throat. Negative for congestion and voice change.   Respiratory: Negative for cough and shortness of breath.      Physical Exam Updated Vital Signs BP (!) 136/98 (BP Location: Right Arm)   Pulse 80   Temp 98.1 F (36.7 C) (Oral)   Resp 19   Ht 5\' 8"  (1.727 m)   Wt 96.2 kg   SpO2 100%   BMI 32.23 kg/m   Physical Exam Vitals signs and nursing note reviewed.  Constitutional:      General: She is not in acute distress.    Appearance: She is well-developed.  HENT:     Head: Normocephalic and atraumatic.     Mouth/Throat:     Comments: Airway is patent.  She does have exudates on her tonsils and 2+ tonsillar hypertrophy.  Maintaining secretions. Neck:     Musculoskeletal: Neck supple.  Cardiovascular:     Rate and Rhythm: Normal rate  and regular rhythm.     Heart sounds: Normal heart sounds. No murmur.  Pulmonary:     Effort: Pulmonary effort is normal. No respiratory distress.     Breath sounds: Normal breath sounds. No wheezing or rales.  Musculoskeletal: Normal range of motion.  Skin:  General: Skin is warm and dry.  Neurological:     Mental Status: She is alert.      ED Treatments / Results  Labs (all labs ordered are listed, but only abnormal results are displayed) Labs Reviewed - No data to display  EKG None  Radiology No results found.  Procedures Procedures (including critical care time)  Medications Ordered in ED Medications  dexamethasone (DECADRON) injection 10 mg (10 mg Intravenous Given 12/08/18 1036)  sodium chloride 0.9 % bolus 1,000 mL (0 mLs Intravenous Stopped 12/08/18 1145)  ketorolac (TORADOL) 30 MG/ML injection 15 mg (15 mg Intravenous Given 12/08/18 1059)     Initial Impression / Assessment and Plan / ED Course  I have reviewed the triage vital signs and the nursing notes.  Pertinent labs & imaging results that were available during my care of the patient were reviewed by me and considered in my medical decision making (see chart for details).       Margaret Cline is a 28 y.o. female who presents to ED for persistent sore throat.  She was seen yesterday afternoon and given Bicillin shot and Toradol.  When she awoke this morning, she still felt as if her throat hurt.  On exam, she is febrile, hemodynamically stable with a patent airway.  She is tolerating her own secretions fine.  She appears she does not want to be swallowing due to the pain.  Given a dose of Decadron in the ER and p.o. challenged.  She is now tolerating p.o. fine and reports feeling a little better.  ECP follow-up if no improvement.  Reasons to return to the ER discussed and all questions answered.  Final Clinical Impressions(s) / ED Diagnoses   Final diagnoses:  Sore throat    ED Discharge Orders    None         , Chase Picket, PA-C 12/08/18 1230    Geoffery Lyons, MD 12/08/18 [phone]

## 2018-12-08 NOTE — ED Triage Notes (Signed)
Pt reports coming to the ED yesterday with c/o sore throat. This morning pt reports increased difficulty swallowing and breathing. Endorses spiting up blood multiple times with clots.

## 2018-12-08 NOTE — Discharge Instructions (Signed)
It was my pleasure taking care of you today!   Ibuprofen as needed for pain.   Stay hydrated.   Follow up with your doctor if your symptoms are not getting better in the next 3 days.   Return to ER if you are getting worse, cannot breath or swallow, or have any additional concerns.

## 2019-02-07 ENCOUNTER — Telehealth: Payer: Medicaid Other | Admitting: Nurse Practitioner

## 2019-02-07 DIAGNOSIS — B373 Candidiasis of vulva and vagina: Secondary | ICD-10-CM

## 2019-02-07 DIAGNOSIS — B9689 Other specified bacterial agents as the cause of diseases classified elsewhere: Secondary | ICD-10-CM

## 2019-02-07 DIAGNOSIS — B3731 Acute candidiasis of vulva and vagina: Secondary | ICD-10-CM

## 2019-02-07 DIAGNOSIS — N760 Acute vaginitis: Secondary | ICD-10-CM

## 2019-02-07 MED ORDER — FLUCONAZOLE 150 MG PO TABS: 150.0000 mg | ORAL_TABLET | Freq: Once | ORAL | 0 refills | Status: AC

## 2019-02-07 MED ORDER — METRONIDAZOLE 500 MG PO TABS: 500.0000 mg | ORAL_TABLET | Freq: Two times a day (BID) | ORAL | 0 refills | Status: DC

## 2019-02-07 NOTE — Progress Notes (Signed)
Contacted patient to clarify what her symptoms were. She has been taking amoxicillin for abscess tooth and she usually gets yeast from amoxicillin. She also says that the discharge is not quite the same as yeast and is more creamy white and has a fish odor.   We are sorry that you are not feeling well. Here is how we plan to help! Based on what you shared with me it looks like you: May have a vaginosis due to bacteria  As well as vaginal candidiasis. Vaginosis is an inflammation of the vagina that can result in discharge, itching and pain. The cause is usually a change in the normal balance of vaginal bacteria or an infection. Vaginosis can also result from reduced estrogen levels after menopause.  The most common causes of vaginosis are:   Bacterial vaginosis which results from an overgrowth of one on several organisms that are normally present in your vagina.   Yeast infections which are caused by a naturally occurring fungus called candida.   Vaginal atrophy (atrophic vaginosis) which results from the thinning of the vagina from reduced estrogen levels after menopause.   Trichomoniasis which is caused by a parasite and is commonly transmitted by sexual intercourse.  Factors that increase your risk of developing vaginosis include: Marland Kitchen Medications, such as antibiotics and steroids . Uncontrolled diabetes . Use of hygiene products such as bubble bath, vaginal spray or vaginal deodorant . Douching . Wearing damp or tight-fitting clothing . Using an intrauterine device (IUD) for birth control . Hormonal changes, such as those associated with pregnancy, birth control pills or menopause . Sexual activity . Having a sexually transmitted infection  Your treatment plan is Metronidazole or Flagyl 500mg  twice a day for 7 days, and diflucam 150mg  1 po now.  I have electronically sent this prescription into the pharmacy that you have chosen.  Be sure to take all of the medication as directed. Stop  taking any medication if you develop a rash, tongue swelling or shortness of breath. Mothers who are breast feeding should consider pumping and discarding their breast milk while on these antibiotics. However, there is no consensus that infant exposure at these doses would be harmful.  Remember that medication creams can weaken latex condoms. Marland Kitchen   HOME CARE:  Good hygiene may prevent some types of vaginosis from recurring and may relieve some symptoms:  . Avoid baths, hot tubs and whirlpool spas. Rinse soap from your outer genital area after a shower, and dry the area well to prevent irritation. Don't use scented or harsh soaps, such as those with deodorant or antibacterial action. Marland Kitchen Avoid irritants. These include scented tampons and pads. . Wipe from front to back after using the toilet. Doing so avoids spreading fecal bacteria to your vagina.  Other things that may help prevent vaginosis include:  Marland Kitchen Don't douche. Your vagina doesn't require cleansing other than normal bathing. Repetitive douching disrupts the normal organisms that reside in the vagina and can actually increase your risk of vaginal infection. Douching won't clear up a vaginal infection. . Use a latex condom. Both female and female latex condoms may help you avoid infections spread by sexual contact. . Wear cotton underwear. Also wear pantyhose with a cotton crotch. If you feel comfortable without it, skip wearing underwear to bed. Yeast thrives in [employer] Your symptoms should improve in the next day or two.  GET HELP RIGHT AWAY IF:  . You have pain in your lower abdomen ( pelvic area or over  your ovaries) . You develop nausea or vomiting . You develop a fever . Your discharge changes or worsens . You have persistent pain with intercourse . You develop shortness of breath, a rapid pulse, or you faint.  These symptoms could be signs of problems or infections that need to be evaluated by a medical provider  now.  MAKE SURE YOU    Understand these instructions.  Will watch your condition.  Will get help right away if you are not doing well or get worse.  Your e-visit answers were reviewed by a board certified advanced clinical practitioner to complete your personal care plan. Depending upon the condition, your plan could have included both over the counter or prescription medications. Please review your pharmacy choice to make sure that you have choses a pharmacy that is open for you to pick up any needed prescription, Your safety is important to us. If you have drug allergies check your prescription carefully.   You can use MyChart to ask questions about today's visit, request a non-urgent call back, or ask for a work or school excuse for 24 hours related to this e-Visit. If it has been greater than 24 hours you will need to follow up with your provider, or enter a new e-Visit to address those concerns. You will get a MyChart message within the next two days asking about your experience. I hope that your e-visit has been valuable and will speed your recovery.  5-10 minutes spent reviewing and documenting in chart.

## 2019-05-02 ENCOUNTER — Telehealth: Payer: Medicaid Other | Admitting: Physician Assistant

## 2019-05-02 DIAGNOSIS — M5440 Lumbago with sciatica, unspecified side: Secondary | ICD-10-CM
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
## 2019-05-03 ENCOUNTER — Encounter (HOSPITAL_COMMUNITY): Payer: Self-pay | Admitting: Emergency Medicine

## 2019-05-03 ENCOUNTER — Other Ambulatory Visit: Payer: Self-pay

## 2019-05-03 ENCOUNTER — Ambulatory Visit (HOSPITAL_COMMUNITY)
Admission: EM | Admit: 2019-05-03 | Discharge: 2019-05-03 | Disposition: A | Discharge status: Home / Self Care | Payer: Self-pay | Attending: Family Medicine | Admitting: Family Medicine

## 2019-05-03 ENCOUNTER — Ambulatory Visit (INDEPENDENT_AMBULATORY_CARE_PROVIDER_SITE_OTHER): Payer: Self-pay

## 2019-05-03 DIAGNOSIS — G44209 Tension-type headache, unspecified, not intractable: Secondary | ICD-10-CM

## 2019-05-03 DIAGNOSIS — M25561 Pain in right knee: Secondary | ICD-10-CM

## 2019-05-03 DIAGNOSIS — R0781 Pleurodynia: Secondary | ICD-10-CM

## 2019-05-03 DIAGNOSIS — R0789 Other chest pain: Secondary | ICD-10-CM

## 2019-05-03 MED ORDER — MELOXICAM 15 MG PO TABS: 15.0000 mg | ORAL_TABLET | Freq: Every day | ORAL | 0 refills | Status: AC

## 2019-05-03 MED ORDER — CYCLOBENZAPRINE HCL 5 MG PO TABS: 5.0000 mg | ORAL_TABLET | Freq: Three times a day (TID) | ORAL | 0 refills | Status: DC | PRN

## 2019-05-03 MED ORDER — KETOROLAC TROMETHAMINE 60 MG/2ML IM SOLN
60.0000 mg | Freq: Once | INTRAMUSCULAR | Status: AC
  Administered 2019-05-03: 60 mg via INTRAMUSCULAR

## 2019-05-03 MED ORDER — KETOROLAC TROMETHAMINE 60 MG/2ML IM SOLN
INTRAMUSCULAR | Status: AC
  Filled 2019-05-03: qty 2
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
## 2019-05-03 NOTE — Discharge Instructions (Addendum)
For pain and inflammation, take 1/2 tablet of meloxicam once daily. If this does not help your pain, then take a full tablet the following day.
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
## 2019-05-07 ENCOUNTER — Other Ambulatory Visit: Payer: Self-pay

## 2019-05-07 ENCOUNTER — Emergency Department (HOSPITAL_COMMUNITY)
Admission: EM | Admit: 2019-05-07 | Discharge: 2019-05-07 | Disposition: A | Discharge status: Home / Self Care | Payer: No Typology Code available for payment source | Attending: Emergency Medicine | Admitting: Emergency Medicine

## 2019-05-07 DIAGNOSIS — Y92410 Unspecified street and highway as the place of occurrence of the external cause: Secondary | ICD-10-CM | POA: Insufficient documentation

## 2019-05-07 DIAGNOSIS — Y9389 Activity, other specified: Secondary | ICD-10-CM | POA: Insufficient documentation

## 2019-05-07 DIAGNOSIS — J45909 Unspecified asthma, uncomplicated: Secondary | ICD-10-CM | POA: Insufficient documentation

## 2019-05-07 DIAGNOSIS — Y999 Unspecified external cause status: Secondary | ICD-10-CM | POA: Diagnosis not present

## 2019-05-07 DIAGNOSIS — M7918 Myalgia, other site: Secondary | ICD-10-CM

## 2019-05-07 DIAGNOSIS — Z79899 Other long term (current) drug therapy: Secondary | ICD-10-CM | POA: Diagnosis not present

## 2019-05-07 DIAGNOSIS — Z87891 Personal history of nicotine dependence: Secondary | ICD-10-CM | POA: Diagnosis not present

## 2019-05-07 DIAGNOSIS — M25511 Pain in right shoulder: Secondary | ICD-10-CM | POA: Diagnosis not present

## 2019-05-07 MED ORDER — KETOROLAC TROMETHAMINE 30 MG/ML IJ SOLN
30.0000 mg | Freq: Once | INTRAMUSCULAR | Status: AC
  Administered 2019-05-07: 30 mg via INTRAMUSCULAR
  Filled 2019-05-07: qty 1
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
## 2019-05-07 NOTE — Discharge Instructions (Signed)
Please read attached information. If you experience any new or worsening signs or symptoms please return to the emergency room for evaluation. Please follow-up with your primary care provider or specialist as discussed.  °
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
## 2019-05-11 NOTE — Progress Notes (Signed)
  Subjective:   Patient ID: Margaret Cline    DOB: 06/20/1991, 28 y.o. female   MRN: [id number]  Margaret Cline is a 28 y.o. female with a history of migraine, obesity, depression w/ prior suicide attempt, tobacco use d/o, asthma here for   HPI:   Patient presents today to establish care. Other Concerns:  - asthma - albuterol prn - alleriges - claritin as needed - migraine - hasn't had trouble since delivering baby - h/o postpartum depression - no issues currently, not on medication   PMH: asthma, allergies, migraine, depression, obesity   Medications: Reviewed and updated in chart, see above   Social History:  Lives with mom, 3 kids, nephew Employment: CMA at [employer].    Exercise: walking all day, prevoiusly was participating in exercise classes before COVID Diet: small meals, fruits and vegetables.    Smoking: quit 03/2019, black and milds 5 Alcohol: occasional, one per month Illicit Drug use: none   FH:  Cancers in family: cousin with throat cancer.  No family history of breast or gynecological cancer Family history of early heart disease: grandpa heart problems, unsure of age.  Mom apparently had a stroke in her late 11s. Mom has HTN, DM2.  Reproductive History - [id number] - Menses: no period on nexplanon. Irregular spotting. - Contraception: nexplanon 2019 - Cancer screening: pap smear 2019 normal -Sexually active, wears condoms  Review of Systems:  Per HPI.  Margaret Cline, medications and smoking status reviewed.  Objective:   BP 114/80   Pulse 80   Ht 5\' 7"  (1.702 m)   Wt 211 lb 8 oz (95.9 kg)   SpO2 99%   BMI 33.13 kg/m  Vitals and nursing note reviewed.  General: Overweight female, in no acute distress with non-toxic appearance.  HEENT: normocephalic, atraumatic, moist mucous membranes.  Bilateral TMs without erythema, purulence, bulging. Neck: supple, non-tender without lymphadenopathy CV: regular rate and rhythm without murmurs, rubs, or gallops, no lower extremity  edema Lungs: clear to auscultation bilaterally with normal work of breathing Abdomen: soft, non-tender, non-distended, normoactive bowel sounds Skin: warm, dry, no rashes or lesions Extremities: warm and well perfused, normal tone MSK: ROM grossly intact, strength intact, gait normal Neuro: Alert and oriented, speech normal Psych: Appropriately groomed and dressed, normal behavior, mood and affect.  No SI.  Assessment & Plan:   Obesity Congratulated on lifestyle changes including diet and exercise in order to lose weight.  With obesity and family history of diabetes is at increased risk of developing diabetes and high blood pressure if she does not lose weight.  Will obtain screening A1c.  Major depression, recurrent Doing well, currently not on medication.  We will continue to monitor.  Low threshold for psychiatry referral given for suicidal ideation per chart review.  Healthcare maintenance Up-to-date on age-appropriate cancer screenings.  Using contraception as appropriate.  Nexplanon due to be replaced in 2 years.  Recommended continued diet and exercise changes in order to lose weight.  Orders Placed This Encounter  Procedures  . CBC  . HgB A1c   Meds ordered this encounter  Medications  . albuterol (PROAIR HFA) 108 (90 Base) MCG/ACT inhaler    Sig: Inhale 2 puffs into the lungs every 4 (four) hours as needed for wheezing or shortness of breath. For shortness of breath/wheezing    Dispense:  18 g    Refill:  [place], DO PGY-3, [facility] 05/12/2019 5:33 PM

## 2019-05-12 ENCOUNTER — Ambulatory Visit (INDEPENDENT_AMBULATORY_CARE_PROVIDER_SITE_OTHER): Payer: Self-pay | Admitting: Family Medicine

## 2019-05-12 ENCOUNTER — Other Ambulatory Visit: Payer: Self-pay

## 2019-05-12 ENCOUNTER — Encounter: Payer: Self-pay | Admitting: Family Medicine

## 2019-05-12 VITALS — BP 114/80 | HR 80 | Ht 67.0 in | Wt 211.5 lb

## 2019-05-12 DIAGNOSIS — F17210 Nicotine dependence, cigarettes, uncomplicated: Secondary | ICD-10-CM

## 2019-05-12 DIAGNOSIS — Z6833 Body mass index (BMI) 33.0-33.9, adult: Secondary | ICD-10-CM

## 2019-05-12 DIAGNOSIS — D649 Anemia, unspecified: Secondary | ICD-10-CM

## 2019-05-12 DIAGNOSIS — J4520 Mild intermittent asthma, uncomplicated: Secondary | ICD-10-CM

## 2019-05-12 DIAGNOSIS — E669 Obesity, unspecified: Secondary | ICD-10-CM

## 2019-05-12 DIAGNOSIS — F3342 Major depressive disorder, recurrent, in full remission: Secondary | ICD-10-CM

## 2019-05-12 LAB — POCT GLYCOSYLATED HEMOGLOBIN (HGB A1C): Hemoglobin A1C: 5.1 % (ref 4.0–5.6)

## 2019-05-12 MED ORDER — ALBUTEROL SULFATE HFA 108 (90 BASE) MCG/ACT IN AERS: 2.0000 | INHALATION_SPRAY | RESPIRATORY_TRACT | 1 refills | Status: AC | PRN

## 2019-05-12 NOTE — Assessment & Plan Note (Signed)
Doing well, currently not on medication.  We will continue to monitor.  Low threshold for psychiatry referral given for suicidal ideation per chart review.

## 2019-05-12 NOTE — Assessment & Plan Note (Addendum)
Congratulated on lifestyle changes including diet and exercise in order to lose weight.  With obesity and family history of diabetes is at increased risk of developing diabetes and high blood pressure if she does not lose weight.  Will obtain screening A1c.

## 2019-05-12 NOTE — Patient Instructions (Signed)
It was great to see you!  Our plans for today:  - Try nasal steroids like flonase or nasonex daily for a couple of weeks for your ear pain. - We sent your albuterol prescription to the pharmacy. - Ask the ladies up front about applying for the orange card.  We are checking some labs today, we will call you or send you a letter if they are abnormal.   Take care and seek immediate care sooner if you develop any concerns.   Dr. [facility]

## 2019-05-13 LAB — CBC
Hematocrit: 39.7 % (ref 34.0–46.6)
Hemoglobin: 12.8 g/dL (ref 11.1–15.9)
MCH: 29.4 pg (ref 26.6–33.0)
MCHC: 32.2 g/dL (ref 31.5–35.7)
MCV: 91 fL (ref 79–97)
Platelets: 269 x10E3/uL (ref 150–450)
RBC: 4.36 x10E6/uL (ref 3.77–5.28)
RDW: 13.2 % (ref 11.7–15.4)
WBC: 10 x10E3/uL (ref 3.4–10.8)

## 2019-06-03 ENCOUNTER — Ambulatory Visit: Payer: Medicaid Other | Admitting: Family Medicine

## 2019-06-04 ENCOUNTER — Ambulatory Visit (INDEPENDENT_AMBULATORY_CARE_PROVIDER_SITE_OTHER): Payer: Self-pay | Admitting: Family Medicine

## 2019-06-04 ENCOUNTER — Other Ambulatory Visit: Payer: Self-pay

## 2019-06-04 ENCOUNTER — Other Ambulatory Visit (HOSPITAL_COMMUNITY)
Admission: RE | Admit: 2019-06-04 | Discharge: 2019-06-04 | Disposition: A | Discharge status: Home / Self Care | Payer: Medicaid Other | Source: Ambulatory Visit | Attending: Family Medicine | Admitting: Family Medicine

## 2019-06-04 ENCOUNTER — Encounter: Payer: Self-pay | Admitting: Family Medicine

## 2019-06-04 VITALS — BP 122/78 | HR 97 | Wt 211.8 lb

## 2019-06-04 DIAGNOSIS — B9689 Other specified bacterial agents as the cause of diseases classified elsewhere: Secondary | ICD-10-CM

## 2019-06-04 DIAGNOSIS — N898 Other specified noninflammatory disorders of vagina: Secondary | ICD-10-CM | POA: Insufficient documentation

## 2019-06-04 DIAGNOSIS — Z114 Encounter for screening for human immunodeficiency virus [HIV]: Secondary | ICD-10-CM

## 2019-06-04 DIAGNOSIS — N760 Acute vaginitis: Secondary | ICD-10-CM

## 2019-06-04 LAB — POCT URINALYSIS DIP (CLINITEK)
Blood, UA: NEGATIVE
Glucose, UA: NEGATIVE mg/dL
Ketones, POC UA: NEGATIVE mg/dL
Leukocytes, UA: NEGATIVE
Nitrite, UA: NEGATIVE
POC PROTEIN,UA: NEGATIVE
Spec Grav, UA: >=1.03 — AB (ref 1.010–1.025)
Urobilinogen, UA: 0.2 E.U./dL
pH, UA: 5.5 (ref 5.0–8.0)

## 2019-06-04 LAB — POCT WET PREP (WET MOUNT)
Clue Cells Wet Prep Whiff POC: NEGATIVE
Trichomonas Wet Prep HPF POC: ABSENT

## 2019-06-04 MED ORDER — METRONIDAZOLE 500 MG PO TABS: 500.0000 mg | ORAL_TABLET | Freq: Three times a day (TID) | ORAL | 0 refills | Status: DC

## 2019-06-04 NOTE — Progress Notes (Signed)
Subjective:  Margaret Cline is a 28 y.o. female who presents to the [facility] today with a chief complaint of vaginal discharge and abdominal pain.   HPI: Fanta Wimberley here after a week of "thick white" vaginal discharge. Endorses suprapubic abdominal pain, urinary frequent and urgency.  Denies itching, mal-odor, vaginal pain, vaginal bleeding, dysuria, hematuria or rash. States she has gone to the bathroom 3 times in the last hour but urinates small amounts each time.  She is sexually active with one female partner and uses condoms occasionally. Has hx of BV, yeast, trichomoniasis and chlamydia.   LMP unknown, reports some spotting in last week. Patient has had an Nexplanon implant. G5P3  PHMx: asthma, MDD, obesity    Objective:  Physical Exam: BP 122/78   Pulse 97   Wt 211 lb 12.8 oz (96.1 kg)   SpO2 100%   BMI 33.17 kg/m    GEN:     alert,  no distress   EYES:   pupils equal reactive, EOM intac RESP:  clear to auscultation bilaterally, no increased work of breathing  CVS:   regular rate and rhythm, no murmur, ABD:  soft, non-tender; bowel sounds present; no palpable masses, no CVA tenderness GU:  normal vulva, no lesions or rashes, cervix closed, no cervical motion tenderness, white discharge present EXT:   normal ROM NEURO: speech normal, alert and oriented   Skin:   warm and dry     Results for orders placed or performed in visit on 06/04/19 (from the past 72 hour(s))  POCT URINALYSIS DIP (CLINITEK)     Status: Abnormal   Collection Time: 06/04/19  4:10 PM  Result Value Ref Range   Color, UA yellow yellow   Clarity, UA clear clear   Glucose, UA negative negative mg/dL   Bilirubin, UA small (A) negative   Ketones, POC UA negative negative mg/dL   Spec Grav, UA >=1.030 (A) 1.010 - 1.025   Blood, UA negative negative   pH, UA 5.5 5.0 - 8.0   POC PROTEIN,UA negative negative, trace   Urobilinogen, UA 0.2 0.2 or 1.0 E.U./dL   Nitrite, UA Negative Negative   Leukocytes, UA  Negative Negative  POCT Wet Prep [facility])     Status: Abnormal   Collection Time: 06/04/19  4:20 PM  Result Value Ref Range   Source Wet Prep POC VAG    WBC, Wet Prep HPF POC 1-5    Bacteria Wet Prep HPF POC Few Few   Clue Cells Wet Prep HPF POC Few (A) None    Comment: OCCASIONAL CLUE CELL SEEN   Clue Cells Wet Prep Whiff POC Negative Whiff    Yeast Wet Prep HPF POC None None   Trichomonas Wet Prep HPF POC Absent Absent     Assessment/Plan:  Bacterial vaginosis Patient with 1 week of white vaginal discharge, suprapubic abdominal pain, urinary frequency and urgency.  Hx of BV, yeast, trichomoniasis, chlamydia and uses condoms occasionally with her partner.  GC, Chlamdyia and HIV pending.  Wet prep and UA obtained.  Positive for clue cells.  Treatment with metronidazole 500 mg TID for 7 days.  Will call patient with STI results.     Orders Placed This Encounter  Procedures  . HIV antibody (with reflex)  . POCT Wet Prep [employer])  . POCT URINALYSIS DIP (CLINITEK)    Meds ordered this encounter  Medications  . metroNIDAZOLE (FLAGYL) 500 MG tablet    Sig: Take 1 tablet (500 mg  total) by mouth 3 (three) times daily.    Dispense:  21 tablet    Refill:  0    Health Maintenance reviewed.  PAP due Feb 2021.  Katha CabalVondra Westyn Keatley, DO PGY-1, [facility] 06/04/2019 4:47 PM

## 2019-06-04 NOTE — Assessment & Plan Note (Deleted)
Patient with 1 week of white vaginal discharge, suprapubic abdominal pain, urinary frequency and urgency.  Hx of BV, yeast, trichomoniasis, chlamydia and uses condoms occasionally with her partner.  GC, Chlamdyia and HIV pending.  Wet prep and UA obtained.  Positive clue cells.  Treatment with metronidazole 500 mg TID for 7 days.  Will call patient with STI results.

## 2019-06-04 NOTE — Assessment & Plan Note (Addendum)
Patient with 1 week of white vaginal discharge, suprapubic abdominal pain, urinary frequency and urgency.  Hx of BV, yeast, trichomoniasis, chlamydia and uses condoms occasionally with her partner.  GC, Chlamdyia and HIV pending.  Wet prep and UA obtained.  Positive for clue cells.  Treatment with metronidazole 500 mg TID for 7 days.  Will call patient with STI results.

## 2019-06-04 NOTE — Patient Instructions (Addendum)
.  It was great seeing you today! If you need to be seen for any new issues we're happy to fit you in, just give [place] a call!   If you have questions or concerns please do not hesitate to call at [phone].  Bacterial Vaginosis  Bacterial vaginosis is an infection of the vagina. It happens when too many normal germs (healthy bacteria) grow in the vagina. This infection puts you at risk for infections from sex (STIs). Treating this infection can lower your risk for some STIs. You should also treat this if you are pregnant. It can cause your baby to be born early. Follow these instructions at home: Medicines  Take over-the-counter and prescription medicines only as told by your doctor.  Take or use your antibiotic medicine as told by your doctor. Do not stop taking or using it even if you start to feel better. General instructions  If you your sexual partner is a woman, tell her that you have this infection. She needs to get treatment if she has symptoms. If you have a female partner, he does not need to be treated.  During treatment: ? Avoid sex. ? Do not douche. ? Avoid alcohol as told. ? Avoid breastfeeding as told.  Drink enough fluid to keep your pee (urine) clear or pale yellow.  Keep your vagina and butt (rectum) clean. ? Wash the area with warm water every day. ? Wipe from front to back after you use the toilet.  Keep all follow-up visits as told by your doctor. This is important. Preventing this condition  Do not douche.  Use only warm water to wash around your vagina.  Use protection when you have sex. This includes: ? Latex condoms. ? Dental dams.  Limit how many people you have sex with. It is best to only have sex with the same person (be monogamous).  Get tested for STIs. Have your partner get tested.  Wear underwear that is cotton or lined with cotton.  Avoid tight pants and pantyhose. This is most important in summer.  Do not use any products that have  nicotine or tobacco in them. These include cigarettes and e-cigarettes. If you need help quitting, ask your doctor.  Do not use illegal drugs.  Limit how much alcohol you drink. Contact a doctor if:  Your symptoms do not get better, even after you are treated.  You have more discharge or pain when you pee (urinate).  You have a fever.  You have pain in your belly (abdomen).  You have pain with sex.  Your bleed from your vagina between periods. Summary  This infection happens when too many germs (bacteria) grow in the vagina.  Treating this condition can lower your risk for some infections from sex (STIs).  You should also treat this if you are pregnant. It can cause early (premature) birth.  Do not stop taking or using your antibiotic medicine even if you start to feel better. This information is not intended to replace advice given to you by your health care provider. Make sure you discuss any questions you have with your health care provider. Document Released: 07/10/2008 Document Revised: 09/13/2017 Document Reviewed: 06/16/2016 Elsevier Patient Education  2020 [employer].

## 2019-06-05 ENCOUNTER — Encounter: Payer: Self-pay | Admitting: Family Medicine

## 2019-06-05 LAB — HIV ANTIBODY (ROUTINE TESTING W REFLEX): HIV Screen 4th Generation wRfx: NONREACTIVE

## 2019-06-06 LAB — CERVICOVAGINAL ANCILLARY ONLY
Chlamydia: NEGATIVE
Neisseria Gonorrhea: NEGATIVE

## 2019-06-08 ENCOUNTER — Telehealth: Payer: Self-pay | Admitting: Family Medicine

## 2019-06-08 NOTE — Telephone Encounter (Signed)
Spoke with patient regarding STI lab results.  All were negative.

## 2019-07-03 ENCOUNTER — Other Ambulatory Visit: Payer: Self-pay

## 2019-07-03 ENCOUNTER — Ambulatory Visit (INDEPENDENT_AMBULATORY_CARE_PROVIDER_SITE_OTHER)
Admission: RE | Admit: 2019-07-03 | Discharge: 2019-07-03 | Disposition: A | Discharge status: Home / Self Care | Payer: Medicaid Other | Source: Ambulatory Visit

## 2019-07-03 DIAGNOSIS — R0981 Nasal congestion: Secondary | ICD-10-CM | POA: Diagnosis not present

## 2019-07-03 DIAGNOSIS — J0140 Acute pansinusitis, unspecified: Secondary | ICD-10-CM

## 2019-07-03 DIAGNOSIS — J3489 Other specified disorders of nose and nasal sinuses: Secondary | ICD-10-CM | POA: Diagnosis not present

## 2019-07-03 MED ORDER — AMOXICILLIN-POT CLAVULANATE 875-125 MG PO TABS: 1.0000 | ORAL_TABLET | Freq: Two times a day (BID) | ORAL | 0 refills | Status: AC

## 2019-07-03 NOTE — Discharge Instructions (Addendum)
Rest and push fluids Augmentin prescribed.  Take as directed and to completion Continue with OTC ibuprofen/tylenol as needed for pain Follow up with PCP as needed if symptoms persists Follow up in person or go to the ED if you have any new or worsening symptoms such as fever, chills, worsening sinus pain/pressure, cough, sore throat, chest pain, shortness of breath, abdominal pain, changes in bowel or bladder habits, etc..Marland Kitchen

## 2019-07-03 NOTE — ED Provider Notes (Signed)
[facility] Virtual Visit via Video Note:  Margaret Cline  initiated request for Telemedicine visit with [facility]. I connected with Margaret Cline  on 07/03/2019 at 1:06 PM  for a synchronized telemedicine visit using a video enabled HIPPA compliant telemedicine application. I verified that I am speaking with Margaret Cline  using two identifiers. Margaret HardingBrittany Kentrell Hallahan, PA-C  was physically located in a [facility] site and Margaret DenverRaven Cline was located at a different location.   The limitations of evaluation and management by telemedicine as well as the availability of in-person appointments were discussed. Patient was informed that she  may incur a bill ( including co-pay) for this virtual visit encounter. Margaret Cline  expressed understanding and gave verbal consent to proceed with virtual visit.   [id number] 07/03/19 Arrival Time: 1259  CC: sinus infection  SUBJECTIVE: History from: patient.  Margaret Cline is a 28 y.o. female who presents with sinus pain/ pressure, and nasal congestion x 4 days, worsening symptoms over the last day.  Denies sick exposure to COVID, flu or strep.  Denies recent travel.  Localizes pain/ pressure over frontal, ethmoid, and maxillary sinuses.  Describes as constant and "pressure" in character.  Has tried OTC medications with minimal relief.  Symptoms are made worse towards the evening.  Reports previous symptoms in the past related to sinus infection.   Denies fever, chills, fatigue, sinus pain, rhinorrhea, sore throat, SOB, wheezing, chest pain, nausea, changes in bowel or bladder habits.    Hx also significant for asthma.  Reports mild dry cough and chest tightness yesterday, that is resolved today after using OTC medications.    ROS: As per HPI.  All other pertinent ROS negative.     Past Medical History:  Diagnosis Date  . Anemia    Fe x2  . Asthma   . Chlamydia 2008  . Chronic pain   . Constipation, chronic 11/13/11  .  Depression    postpartum - no meds  . Gonorrhea   . History of chlamydia 03/07/2012  . History of trichomoniasis 03/07/2012  . Hx: UTI (urinary tract infection)    Frequently  . Irregular periods/menstrual cycles   . Migraines    Migraines - no med   . Obese   . Trichomonas 2008  . Vaginal delivery--shoulder dystocia 07/10/2012   Past Surgical History:  Procedure Laterality Date  . INDUCED ABORTION    . WISDOM TOOTH EXTRACTION     Allergies  Allergen Reactions  . Prednisone Nausea And Vomiting   No current facility-administered medications on file prior to encounter.    Current Outpatient Medications on File Prior to Encounter  Medication Sig Dispense Refill  . acetaminophen (TYLENOL) 500 MG tablet Take 1,000 mg by mouth every 8 (eight) hours as needed for headache.    . albuterol (PROAIR HFA) 108 (90 Base) MCG/ACT inhaler Inhale 2 puffs into the lungs every 4 (four) hours as needed for wheezing or shortness of breath. For shortness of breath/wheezing 18 g 1  . albuterol (PROVENTIL) (2.5 MG/3ML) 0.083% nebulizer solution Take 3 mLs (2.5 mg total) by nebulization every 4 (four) hours as needed for wheezing or shortness of breath. 30 vial 0  . cyclobenzaprine (FLEXERIL) 5 MG tablet Take 1 tablet (5 mg total) by mouth 3 (three) times daily as needed for muscle spasms. 60 tablet 0  . ferrous sulfate 325 (65 FE) MG tablet Take 1 tablet (325 mg total) by mouth daily with breakfast. (Patient taking  differently: Take 325 mg by mouth 2 (two) times daily with a meal. ) 60 tablet 3  . ibuprofen (ADVIL,MOTRIN) 600 MG tablet Take 1 tablet (600 mg total) by mouth every 6 (six) hours as needed for mild pain or moderate pain. 30 tablet 0  . loratadine (CLARITIN) 10 MG tablet Take 1 tablet (10 mg total) by mouth daily. (Patient taking differently: Take 10 mg daily as needed by mouth for allergies or rhinitis. ) 20 tablet 0  . meloxicam (MOBIC) 15 MG tablet Take 1 tablet (15 mg total) by mouth daily. 30  tablet 0  . metroNIDAZOLE (FLAGYL) 500 MG tablet Take 1 tablet (500 mg total) by mouth 3 (three) times daily. 21 tablet 0  . Prenatal Multivit-Min-Fe-FA (PRENATAL VITAMINS) 0.8 MG tablet Take 1 tablet by mouth daily. 30 tablet 12    OBJECTIVE:   There were no vitals filed for this visit.  General appearance: alert; no distress Eyes: EOMI grossly HENT: normocephalic; atraumatic Neck: supple with FROM Lungs: normal respiratory effort; speaking in full sentences without difficulty Extremities: moves extremities without difficulty Skin: No obvious rashes Neurologic: No facial asymmetries Psychological: alert and cooperative; normal mood and affect  ASSESSMENT & PLAN:  1. Chronic pansinusitis   2. Nasal congestion   3. Sinus pain     Meds ordered this encounter  Medications  . amoxicillin-clavulanate (AUGMENTIN) 875-125 MG tablet    Sig: Take 1 tablet by mouth every 12 (twelve) hours for 10 days.    Dispense:  20 tablet    Refill:  0    Order Specific Question:   Supervising Provider    Answer:   Raylene Everts [[id number]]    Rest and push fluids Augmentin prescribed.  Take as directed and to completion Continue with OTC ibuprofen/tylenol as needed for pain Follow up with PCP as needed if symptoms persists Follow up in person or go to the ED if you have any new or worsening symptoms such as fever, chills, worsening sinus pain/pressure, cough, sore throat, chest pain, shortness of breath, abdominal pain, changes in bowel or bladder habits, etc...   I discussed the assessment and treatment plan with the patient. The patient was provided an opportunity to ask questions and all were answered. The patient agreed with the plan and demonstrated an understanding of the instructions.   The patient was advised to call back or seek an in-person evaluation if the symptoms worsen or if the condition fails to improve as anticipated.  I provided 5 minutes of non-face-to-face time during  this encounter.  [place], PA-C  07/03/2019 1:06 PM          Lestine Box, PA-C 07/03/19 1309

## 2019-07-08 ENCOUNTER — Encounter: Payer: Self-pay | Admitting: Family Medicine

## 2019-07-09 ENCOUNTER — Other Ambulatory Visit: Payer: Self-pay | Admitting: Family Medicine

## 2019-07-09 MED ORDER — FLUCONAZOLE 150 MG PO TABS: 150.0000 mg | ORAL_TABLET | Freq: Once | ORAL | 1 refills | Status: AC

## 2019-07-13 ENCOUNTER — Other Ambulatory Visit: Payer: Self-pay | Admitting: Family Medicine

## 2019-08-07 ENCOUNTER — Inpatient Hospital Stay: Admission: RE | Admit: 2019-08-07 | Payer: Medicaid Other | Source: Ambulatory Visit

## 2020-06-24 ENCOUNTER — Ambulatory Visit (HOSPITAL_COMMUNITY)
Admission: EM | Admit: 2020-06-24 | Discharge: 2020-06-24 | Disposition: A | Discharge status: Home / Self Care | Payer: Medicaid Other

## 2020-06-24 ENCOUNTER — Other Ambulatory Visit: Payer: Self-pay

## 2020-06-24 DIAGNOSIS — R519 Headache, unspecified: Secondary | ICD-10-CM

## 2020-06-24 DIAGNOSIS — S0181XA Laceration without foreign body of other part of head, initial encounter: Secondary | ICD-10-CM

## 2020-06-24 MED ORDER — LIDOCAINE-EPINEPHRINE 1 %-1:100000 IJ SOLN
INTRAMUSCULAR | Status: AC
  Filled 2020-06-24: qty 1

## 2020-06-24 NOTE — ED Triage Notes (Addendum)
Patient reports falling prior to arrival with small (apprx 1 inch) well approximated laceration to left side of face above lip. Bleeding controlled.

## 2020-06-24 NOTE — ED Provider Notes (Signed)
[facility]   MRN: [id number] DOB: 01-19-1991  Subjective:   Margaret Cline is a 29 y.o. female presenting for suffering an accidental fall prior to arriving to the clinic. Patient states that she made impact with the left side of her face and has had some bleeding. Came straight to our clinic. Tdap is up-to-date.  No current facility-administered medications for this encounter.  Current Outpatient Medications:    lisinopril (ZESTRIL) 5 MG tablet, Take 5 mg by mouth daily., Disp: , Rfl:    acetaminophen (TYLENOL) 500 MG tablet, Take 1,000 mg by mouth every 8 (eight) hours as needed for headache., Disp: , Rfl:    albuterol (PROAIR HFA) 108 (90 Base) MCG/ACT inhaler, Inhale 2 puffs into the lungs every 4 (four) hours as needed for wheezing or shortness of breath. For shortness of breath/wheezing, Disp: 18 g, Rfl: 1   albuterol (PROVENTIL) (2.5 MG/3ML) 0.083% nebulizer solution, Take 3 mLs (2.5 mg total) by nebulization every 4 (four) hours as needed for wheezing or shortness of breath., Disp: 30 vial, Rfl: 0   cyclobenzaprine (FLEXERIL) 5 MG tablet, Take 1 tablet (5 mg total) by mouth 3 (three) times daily as needed for muscle spasms., Disp: 60 tablet, Rfl: 0   ferrous sulfate 325 (65 FE) MG tablet, Take 1 tablet (325 mg total) by mouth daily with breakfast. (Patient taking differently: Take 325 mg by mouth 2 (two) times daily with a meal. ), Disp: 60 tablet, Rfl: 3   ibuprofen (ADVIL,MOTRIN) 600 MG tablet, Take 1 tablet (600 mg total) by mouth every 6 (six) hours as needed for mild pain or moderate pain., Disp: 30 tablet, Rfl: 0   loratadine (CLARITIN) 10 MG tablet, Take 1 tablet (10 mg total) by mouth daily. (Patient taking differently: Take 10 mg daily as needed by mouth for allergies or rhinitis. ), Disp: 20 tablet, Rfl: 0   meloxicam (MOBIC) 15 MG tablet, Take 1 tablet (15 mg total) by mouth daily., Disp: 30 tablet, Rfl: 0   metroNIDAZOLE (FLAGYL) 500 MG tablet,  Take 1 tablet (500 mg total) by mouth 3 (three) times daily., Disp: 21 tablet, Rfl: 0   Prenatal Multivit-Min-Fe-FA (PRENATAL VITAMINS) 0.8 MG tablet, Take 1 tablet by mouth daily., Disp: 30 tablet, Rfl: 12   Allergies  Allergen Reactions   Prednisone Nausea And Vomiting    Past Medical History:  Diagnosis Date   Anemia    Fe x2   Asthma    Chlamydia 2008   Chronic pain    Constipation, chronic 11/13/11   Depression    postpartum - no meds   Gonorrhea    History of chlamydia 03/07/2012   History of trichomoniasis 03/07/2012   Hx: UTI (urinary tract infection)    Frequently   Irregular periods/menstrual cycles    Migraines    Migraines - no med    Obese    Trichomonas 2008   Vaginal delivery--shoulder dystocia 07/10/2012     Past Surgical History:  Procedure Laterality Date   INDUCED ABORTION     WISDOM TOOTH EXTRACTION      Family History  Problem Relation Age of Onset   Hypertension Mother    Heart disease Mother    Diabetes Mother    Stroke Mother    Hyperlipidemia Mother    Hypertension Sister    Thrombophlebitis Sister        clotting issue   Depression Sister    Heart murmur Sister    Heart disease  Maternal Grandfather    Drug abuse Father    Alcohol abuse Maternal Uncle    Kidney disease Maternal Uncle        failure   Rheum arthritis Sister        arthritis vs fibromyalgia   Fibromyalgia Sister    Depression Sister    Hypertension Sister    Anesthesia problems Neg Hx    Hypotension Neg Hx    Malignant hyperthermia Neg Hx    Pseudochol deficiency Neg Hx     Social History   Tobacco Use   Smoking status: Former Smoker    Packs/day: 0.25    Types: Cigarettes   Smokeless tobacco: Never Used   Tobacco comment: not since pregnancy  Vaping Use   Vaping Use: Never used  Substance Use Topics   Alcohol use: Not Currently   Drug use: Never    ROS   Objective:   Vitals: BP (!) 126/92 (BP  Location: Right Arm)    Pulse 96    Temp 98.8 F (37.1 C) (Oral)    Resp 16    SpO2 100%   Physical Exam Constitutional:      General: She is not in acute distress.    Appearance: Normal appearance. She is well-developed. She is not ill-appearing, toxic-appearing or diaphoretic.  HENT:     Head: Normocephalic and atraumatic.      Right Ear: External ear normal.     Left Ear: External ear normal.     Nose: Nose normal.     Mouth/Throat:     Mouth: Mucous membranes are moist.     Pharynx: Oropharynx is clear.  Eyes:     General: No scleral icterus.       Right eye: No discharge.        Left eye: No discharge.     Extraocular Movements: Extraocular movements intact.     Conjunctiva/sclera: Conjunctivae normal.     Pupils: Pupils are equal, round, and reactive to light.  Cardiovascular:     Rate and Rhythm: Normal rate.  Pulmonary:     Effort: Pulmonary effort is normal.  Skin:    General: Skin is warm and dry.  Neurological:     General: No focal deficit present.     Mental Status: She is alert and oriented to person, place, and time.  Psychiatric:        Mood and Affect: Mood normal.        Behavior: Behavior normal.        Thought Content: Thought content normal.        Judgment: Judgment normal.     PROCEDURE NOTE: laceration repair Verbal consent obtained from patient.  Local anesthesia with 2cc Lidocaine 1% with epinephrine.  Wound explored for tendon, ligament damage. Wound scrubbed with soap and water and rinsed. Wound closed with #2 5-0 Ethilon (simple interrupted) sutures.  Wound cleansed and dressed.   Assessment and Plan :   PDMP not reviewed this encounter.  1. Facial pain   2. Facial laceration, initial encounter     Laceration repaired successfully. Wound care reviewed. Use Tylenol and ibuprofen for pain. Due to depth of the wound, recommended returning to clinic in 6 to 7 days for suture removal instead of the traditional 5 days for facial lacerations.  Counseled patient on potential for adverse effects with medications prescribed/recommended today, ER and return-to-clinic precautions discussed, patient verbalized understanding.    Wallis Bamberg, PA-C 06/24/20 1501

## 2020-06-24 NOTE — Discharge Instructions (Signed)
WOUND CARE Please return in 6-7 days to have your stitches/staples removed or sooner if you have concerns.  Keep area clean and dry for 24 hours. Do not remove bandage, if applied.  After 24 hours, remove bandage and wash wound gently with mild soap and warm water. Reapply a new bandage after cleaning wound, if directed.  Continue daily cleansing with soap and water until stitches/staples are removed.  Do not apply any ointments or creams to the wound while stitches/staples are in place, as this may cause delayed healing.  Notify the office if you experience any of the following signs of infection: Swelling, redness, pus drainage, streaking, fever >101.0 F  Notify the office if you experience excessive bleeding that does not stop after 15-20 minutes of constant, firm pressure.  

## 2020-06-30 ENCOUNTER — Other Ambulatory Visit: Payer: Self-pay

## 2020-06-30 ENCOUNTER — Ambulatory Visit (HOSPITAL_COMMUNITY)
Admission: EM | Admit: 2020-06-30 | Discharge: 2020-06-30 | Disposition: A | Discharge status: Home / Self Care | Payer: Medicaid Other

## 2020-06-30 DIAGNOSIS — Z4802 Encounter for removal of sutures: Secondary | ICD-10-CM

## 2020-06-30 NOTE — ED Triage Notes (Signed)
Pt presents to have 2 sutures removed from left side of lip.

## 2020-10-25 ENCOUNTER — Telehealth: Payer: Self-pay

## 2020-10-25 NOTE — Telephone Encounter (Signed)
I spoke with the patient regarding her appointment for tomorrow 10/26/2020. It came to our attention that the patient had been dismissed from our practice in 2017 for behavior towards staff, and it was an oversight on our part to get her scheduled. I acknowledged and apologized to the patient about that oversight. After talking to the patient about why another provider would not be able to see her, she advised me that she would be filing a complaint with her insurance company and with the medical board regarding this dismissal. I made our managers aware of my conversation with this patient.

## 2020-10-26 ENCOUNTER — Ambulatory Visit: Payer: No Typology Code available for payment source | Admitting: Neurology

## 2021-04-23 ENCOUNTER — Ambulatory Visit (HOSPITAL_COMMUNITY)
Admission: EM | Admit: 2021-04-23 | Discharge: 2021-04-23 | Disposition: A | Discharge status: Home / Self Care | Payer: Medicaid Other | Attending: Emergency Medicine | Admitting: Emergency Medicine

## 2021-04-23 ENCOUNTER — Other Ambulatory Visit: Payer: Self-pay

## 2021-04-23 ENCOUNTER — Telehealth (HOSPITAL_COMMUNITY): Payer: Self-pay

## 2021-04-23 ENCOUNTER — Encounter (HOSPITAL_COMMUNITY): Payer: Self-pay | Admitting: *Deleted

## 2021-04-23 DIAGNOSIS — H66001 Acute suppurative otitis media without spontaneous rupture of ear drum, right ear: Secondary | ICD-10-CM

## 2021-04-23 DIAGNOSIS — J029 Acute pharyngitis, unspecified: Secondary | ICD-10-CM

## 2021-04-23 MED ORDER — AMOXICILLIN-POT CLAVULANATE 875-125 MG PO TABS: 1.0000 | ORAL_TABLET | Freq: Two times a day (BID) | ORAL | 0 refills | Status: DC

## 2021-04-23 MED ORDER — DEXAMETHASONE SODIUM PHOSPHATE 10 MG/ML IJ SOLN
10.0000 mg | Freq: Once | INTRAMUSCULAR | Status: AC
  Administered 2021-04-23: 10 mg via INTRAMUSCULAR

## 2021-04-23 MED ORDER — DEXAMETHASONE SODIUM PHOSPHATE 10 MG/ML IJ SOLN
INTRAMUSCULAR | Status: AC
  Filled 2021-04-23: qty 1

## 2021-04-23 NOTE — ED Provider Notes (Signed)
[facility]    CSN: [id number] Arrival date & time: 04/23/21  1713      History   Chief Complaint Chief Complaint  Patient presents with   Oral Swelling    throat    HPI Margaret Cline is a 30 y.o. female.   Patient here for evaluation of right ear pain, sore throat, and throat swelling that started earlier today.  Reports the ear pain and throat swelling have gotten progressively worse throughout the day now with unable to turn neck to the right side.  Reports painful swallowing but managing secretions in office.  Denies any fevers.  Denies any known sick contacts.  Denies any trauma, injury, or other precipitating event.  Denies any specific alleviating or aggravating factors.  Denies any fevers, chest pain, shortness of breath, N/V/D, numbness, tingling, weakness, abdominal pain, or headaches.     The history is provided by the patient.   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   Cigarette nicotine dependence without complication 02/23/2015   BMI 33.0-33.9,adult 02/23/2015   Major depression, recurrent (HCC) 08/23/2013   Suicide attempt by multiple drug overdose (HCC) 08/23/2013   Bacterial vaginosis 05/08/2012   Asthma 03/20/2012   Obesity 03/07/2012    Past Surgical History:  Procedure Laterality Date   INDUCED ABORTION     WISDOM TOOTH EXTRACTION      OB History     Gravida  5   Para  3   Term  3   Preterm  0   AB  2   Living  3      SAB   1   IAB  1   Ectopic  0   Multiple  0   Live Births  3            Home Medications    Prior to Admission medications   Medication Sig Start Date End Date Taking? Authorizing Provider  acetaminophen (TYLENOL) 500 MG tablet Take 1,000 mg by mouth every 8 (eight) hours as needed for headache.    [provider]  albuterol (PROAIR HFA) 108 (90 Base) MCG/ACT inhaler Inhale 2 puffs into the lungs every 4 (four) hours as needed for wheezing or shortness of breath. For shortness of breath/wheezing 05/12/19   Caro Laroche, DO  albuterol (PROVENTIL) (2.5 MG/3ML) 0.083% nebulizer solution Take 3 mLs (2.5 mg total) by nebulization every 4 (four) hours as needed for wheezing or shortness of breath. 09/06/15   Street, Mercedes, PA-C  amoxicillin-clavulanate (AUGMENTIN) 875-125 MG tablet Take 1 tablet by mouth every 12 (twelve) hours. 04/23/21   Ivette Loyal, NP  cyclobenzaprine (FLEXERIL) 5 MG tablet Take 1 tablet (5 mg total) by mouth 3 (three) times daily as needed for muscle spasms. 05/03/19   Wallis Bamberg, PA-C  ferrous sulfate 325 (65 FE) MG tablet Take 1 tablet (325  mg total) by mouth daily with breakfast. Patient taking differently: Take 325 mg by mouth 2 (two) times daily with a meal.  03/08/18   Kooistra, Charlesetta Garibaldi, CNM  ibuprofen (ADVIL,MOTRIN) 600 MG tablet Take 1 tablet (600 mg total) by mouth every 6 (six) hours as needed for mild pain or moderate pain. 07/02/18   Janeece Riggers, CNM  lisinopril (ZESTRIL) 5 MG tablet Take 5 mg by mouth daily.    [provider]  loratadine (CLARITIN) 10 MG tablet Take 1 tablet (10 mg total) by mouth daily. Patient taking differently: Take 10 mg daily as needed by mouth for allergies or rhinitis.  05/29/17   Kellie Shropshire, PA-C  meloxicam (MOBIC) 15 MG tablet Take 1 tablet (15 mg total) by mouth daily. 05/03/19   Wallis Bamberg, PA-C  Prenatal Multivit-Min-Fe-FA (PRENATAL VITAMINS) 0.8 MG tablet Take 1 tablet by mouth daily.  11/13/17   Dorathy Kinsman, CNM    Family History Family History  Problem Relation Age of Onset   Hypertension Mother    Heart disease Mother    Diabetes Mother    Stroke Mother    Hyperlipidemia Mother    Drug abuse Father    Hypertension Sister    Thrombophlebitis Sister        clotting issue   Depression Sister    Heart murmur Sister    Rheum arthritis Sister        arthritis vs fibromyalgia   Fibromyalgia Sister    Depression Sister    Hypertension Sister    Heart disease Maternal Grandfather    Alcohol abuse Maternal Uncle    Kidney disease Maternal Uncle        failure   Anesthesia problems Neg Hx    Hypotension Neg Hx    Malignant hyperthermia Neg Hx    Pseudochol deficiency Neg Hx     Social History Social History   Tobacco Use   Smoking status: Former    Packs/day: 0.25    Pack years: 0.00    Types: Cigarettes   Smokeless tobacco: Never   Tobacco comments:    not since pregnancy  Vaping Use   Vaping Use: Never used  Substance Use Topics   Alcohol use: Not Currently   Drug use: Never     Allergies   Prednisone   Review of Systems Review of Systems  Constitutional:  Negative for fatigue.  HENT:  Positive for ear pain, sore throat and trouble swallowing.   All other systems reviewed and are negative.   Physical Exam Triage Vital Signs ED Triage Vitals  Enc Vitals Group     BP 04/23/21 1732 (!) 113/57     Pulse Rate 04/23/21 1732 81     Resp 04/23/21 1732 18     Temp 04/23/21 1732 98.6 F (37 C)     Temp src --      SpO2 04/23/21 1732 100 %     Weight --      Height --      Head Circumference --      Peak Flow --      Pain Score 04/23/21 1733 10     Pain Loc --      Pain Edu? --      Excl. in GC? --    No data found.  Updated Vital Signs BP (!) 113/57   Pulse 81   Temp 98.6 F (37 C)   Resp 18   SpO2 100%   Visual  Acuity Right Eye Distance:   Left Eye Distance:   Bilateral Distance:    Right Eye Near:   Left Eye  Near:    Bilateral Near:     Physical Exam Vitals and nursing note reviewed.  Constitutional:      General: She is not in acute distress.    Appearance: Normal appearance. She is not ill-appearing, toxic-appearing or diaphoretic.  HENT:     Head: Normocephalic and atraumatic.     Right Ear: Ear canal and external ear normal. Tympanic membrane is injected, erythematous and bulging.     Left Ear: Hearing, tympanic membrane, ear canal and external ear normal.     Mouth/Throat:     Pharynx: Uvula midline. Pharyngeal swelling and posterior oropharyngeal erythema present.     Tonsils: No tonsillar exudate or tonsillar abscesses. 2+ on the right. 2+ on the left.  Eyes:     Conjunctiva/sclera: Conjunctivae normal.  Cardiovascular:     Rate and Rhythm: Normal rate.     Pulses: Normal pulses.  Pulmonary:     Effort: Pulmonary effort is normal.  Abdominal:     General: Abdomen is flat.  Musculoskeletal:        General: Normal range of motion.     Cervical back: Normal range of motion.  Skin:    General: Skin is warm and dry.  Neurological:     General: No focal deficit present.     Mental Status: She is alert and oriented to person, place, and time.  Psychiatric:        Mood and Affect: Mood normal.     UC Treatments / Results  Labs (all labs ordered are listed, but only abnormal results are displayed) Labs Reviewed - No data to display  EKG   Radiology No results found.  Procedures Procedures (including critical care time)  Medications Ordered in UC Medications  dexamethasone (DECADRON) injection 10 mg (10 mg Intramuscular Given 04/23/21 1812)    Initial Impression / Assessment and Plan / UC Course  I have reviewed the triage vital signs and the nursing notes.  Pertinent labs & imaging results that were available during my care of the patient were reviewed by me and considered in my medical decision making (see chart for details).    Assessment negative for red  flags or concerns.  Acute pharyngitis and otitis media of the right ear without spontaneous rupture.  Pharyngitis possibly strep but as patient declined strep testing due to pain.  We will treat otitis media and possible strep with Augmentin twice daily for the next 7 days.  Decadron IM administered in office to help with throat swelling and pain.  May take Tylenol and/or ibuprofen as needed for pain and fevers.  Discussed conservative symptom management as described in discharge instructions.  Encourage fluids and rest.  Patient instructed to go to the emergency room for further evaluation for any worsening pain or difficulty breathing or swallowing.  Otherwise follow-up with primary care as needed. Final Clinical Impressions(s) / UC Diagnoses   Final diagnoses:  Acute pharyngitis, unspecified etiology  Non-recurrent acute suppurative otitis media of right ear without spontaneous rupture of tympanic membrane     Discharge Instructions      Take the augmentin twice a day for the next 7 days.    You can take Tylenol and/or Ibuprofen as needed for pain relief and fever reduction.    For sore throat: try warm salt water gargles, cepacol lozenges, throat spray, warm tea or  water with lemon/honey, popsicles or ice, or OTC cold relief medicine for throat discomfort.  It is important to stay hydrated: drink plenty of fluids (water, gatorade/powerade/pedialyte, juices, or teas) to keep your throat moisturized and help further relieve irritation/discomfort.   Return or go to the Emergency Department if symptoms worsen or do not improve in the next few days.      ED Prescriptions     Medication Sig Dispense Auth. Provider   amoxicillin-clavulanate (AUGMENTIN) 875-125 MG tablet Take 1 tablet by mouth every 12 (twelve) hours. 14 tablet Ivette Loyal, NP      PDMP not reviewed this encounter.   Ivette Loyal, NP 04/23/21 [phone]

## 2021-04-23 NOTE — ED Triage Notes (Signed)
Pt reports her throat started to swell today.

## 2021-04-23 NOTE — Discharge Instructions (Signed)
Take the augmentin twice a day for the next 7 days.    You can take Tylenol and/or Ibuprofen as needed for pain relief and fever reduction.    For sore throat: try warm salt water gargles, cepacol lozenges, throat spray, warm tea or water with lemon/honey, popsicles or ice, or OTC cold relief medicine for throat discomfort.  It is important to stay hydrated: drink plenty of fluids (water, gatorade/powerade/pedialyte, juices, or teas) to keep your throat moisturized and help further relieve irritation/discomfort.   Return or go to the Emergency Department if symptoms worsen or do not improve in the next few days.

## 2021-05-17 ENCOUNTER — Encounter: Payer: Self-pay | Admitting: *Deleted

## 2021-05-25 ENCOUNTER — Other Ambulatory Visit: Payer: Self-pay

## 2021-05-25 ENCOUNTER — Ambulatory Visit (INDEPENDENT_AMBULATORY_CARE_PROVIDER_SITE_OTHER): Payer: Medicaid Other | Admitting: Obstetrics & Gynecology

## 2021-05-25 DIAGNOSIS — R87619 Unspecified abnormal cytological findings in specimens from cervix uteri: Secondary | ICD-10-CM

## 2021-05-25 NOTE — Progress Notes (Signed)
Patient came to her appointment. CMA called [facility] to have them fax over patient's cytology pap report. Office visit note were scanned in the media but no cytology pap report. CMA called 2x to [facility] to have them fax over the report while the patient is currently here in the lobby waiting.   Patient stated she will reschedule.

## 2021-05-31 ENCOUNTER — Encounter: Payer: Self-pay | Admitting: General Practice

## 2021-06-09 ENCOUNTER — Ambulatory Visit (INDEPENDENT_AMBULATORY_CARE_PROVIDER_SITE_OTHER): Payer: Medicaid Other | Admitting: Family Medicine

## 2021-06-09 ENCOUNTER — Encounter: Payer: Self-pay | Admitting: Family Medicine

## 2021-06-09 ENCOUNTER — Other Ambulatory Visit: Payer: Self-pay

## 2021-06-09 ENCOUNTER — Other Ambulatory Visit (HOSPITAL_COMMUNITY)
Admission: RE | Admit: 2021-06-09 | Discharge: 2021-06-09 | Disposition: A | Discharge status: Home / Self Care | Payer: Medicaid Other | Source: Ambulatory Visit | Attending: Family Medicine | Admitting: Family Medicine

## 2021-06-09 VITALS — BP 119/85 | HR 80 | Ht 68.0 in | Wt 198.0 lb

## 2021-06-09 DIAGNOSIS — N871 Moderate cervical dysplasia: Secondary | ICD-10-CM | POA: Insufficient documentation

## 2021-06-09 DIAGNOSIS — R87613 High grade squamous intraepithelial lesion on cytologic smear of cervix (HGSIL): Secondary | ICD-10-CM | POA: Diagnosis present

## 2021-06-09 NOTE — Progress Notes (Signed)
    GYNECOLOGY CLINIC COLPOSCOPY PROCEDURE NOTE  30 y.o. 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  Pregnancy test: Lab Results  Component Value Date   PREGTESTUR POSITIVE (A) 10/26/2017    Allergies  Allergen Reactions   Prednisone Nausea And Vomiting    Patient given informed consent, signed copy in the chart, time out was performed.    Placed in lithotomy position. Cervix viewed with speculum and colposcope after application of acetic acid.   Colposcopy Adequacy Cervix fully visualized: Yes  SCJ fully visualized: No unable to visualize right upper quadrant  Colposcopy Findings dense acetowhite lesion(s) noted at 1 and 4 o'clock (lesion within a lesion), unusual yellow/tan lesion at 5 o clock (?nabothian?), and dense acetowhite lesion with punctation noted at 9 o'clock  Corresponding biopsies were obtained.    ECC specimen was obtained.  All specimens were labeled and sent to pathology.  Hemostatic measures: Pressure and Monsel's solution  Complications: none  Patient tolerated the procedure well.  OBGyn Exam  Colposcopy Impressions High grade features  Plan Treatment plan pending biopsy results, per patient preference they will be communicated by telephone.  Patient was given post procedure instructions.  Will follow up pathology and manage accordingly; patient will be contacted with results and recommendations.  Routine preventative health maintenance measures emphasized.  Venora Maples, MD/MPH Attending Family Medicine Physician, [facility] for [facility], [facility]

## 2021-06-12 ENCOUNTER — Encounter: Payer: Self-pay | Admitting: Family Medicine

## 2021-06-12 ENCOUNTER — Telehealth: Payer: Self-pay | Admitting: Family Medicine

## 2021-06-12 DIAGNOSIS — N879 Dysplasia of cervix uteri, unspecified: Secondary | ICD-10-CM | POA: Insufficient documentation

## 2021-06-12 DIAGNOSIS — N871 Moderate cervical dysplasia: Secondary | ICD-10-CM | POA: Insufficient documentation

## 2021-06-12 LAB — SURGICAL PATHOLOGY

## 2021-06-12 NOTE — Telephone Encounter (Signed)
Called patient and identity confirmed with name and date of birth.  Reviewed pathology showing CIN 2, with options of active surveillance vs proceeding to LEEP.   Reviewed risks and benefits of LEEP, including possible progression to frank cervical cancer without the procedure, ~8% risk of cervical stenosis and attendant risks of abnormal labor curve and PPROM, increased rate of 2nd trimester pregnancy loss (increased from 0.4% to 1.6% in one study, with overall RR 2.6), and the risk of preterm birth (increased from 5.4% to 9.5%  in one study with overall RR 1.75).   After reviewing risks and benefits patient elected to proceed with LEEP.  Admin staff please call patient and schedule for LEEP with OBGYN provider.

## 2021-06-21 ENCOUNTER — Telehealth: Payer: Self-pay | Admitting: Obstetrics and Gynecology

## 2021-06-21 NOTE — Telephone Encounter (Signed)
Called pt; VM left stating I am returning phone call and pt may call back if she still has concerns.

## 2021-06-21 NOTE — Telephone Encounter (Signed)
Patient want to know if she can be put to sleep for her Leep, she said someone told her that she could.

## 2021-07-15 ENCOUNTER — Encounter: Payer: Self-pay | Admitting: Radiology

## 2021-07-26 ENCOUNTER — Ambulatory Visit (INDEPENDENT_AMBULATORY_CARE_PROVIDER_SITE_OTHER): Payer: Medicaid Other | Admitting: Obstetrics and Gynecology

## 2021-07-26 ENCOUNTER — Encounter: Payer: Self-pay | Admitting: Obstetrics and Gynecology

## 2021-07-26 ENCOUNTER — Other Ambulatory Visit (HOSPITAL_COMMUNITY)
Admission: RE | Admit: 2021-07-26 | Discharge: 2021-07-26 | Disposition: A | Discharge status: Home / Self Care | Payer: Medicaid Other | Source: Ambulatory Visit | Attending: Obstetrics and Gynecology | Admitting: Obstetrics and Gynecology

## 2021-07-26 ENCOUNTER — Other Ambulatory Visit: Payer: Self-pay

## 2021-07-26 VITALS — BP 119/96 | HR 84 | Wt 198.0 lb

## 2021-07-26 DIAGNOSIS — Z3202 Encounter for pregnancy test, result negative: Secondary | ICD-10-CM | POA: Diagnosis not present

## 2021-07-26 DIAGNOSIS — N871 Moderate cervical dysplasia: Secondary | ICD-10-CM | POA: Insufficient documentation

## 2021-07-26 LAB — POCT PREGNANCY, URINE: Preg Test, Ur: NEGATIVE

## 2021-07-26 NOTE — Procedures (Signed)
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  The patient was placed in the dorsal lithotomy position. A coated Graves was speculum inserted in the vagina, and the cervix was visualized.  A colposcopy was performed with acetic acid and lugol's staining, with the below noted findings. The cervix has an indentation that goes from the os to 2:30.  The cervical stromal bed was injected with 28mL of 1% lidocaine with epinephrine. Entering at 12 o'clock on the cervix and using a large, shallow Fischer Loop and a setting of 50/50 blend current, a LEEP was done and a piece from 12 to 4 was created (unintentionally) due to the indentation.  circumferential fashion.  The LEEP was continued from that pint back to the start; both samples sent in separate containers. Next, an ECC was done and hemostasis achieved with the ball electrode on 50 coagulation current and application of Monsel's. On coagulation, the entire LEEP bed and surgical margins were thoroughly cauterized  Findings: diffuse AWE changes with increased vascularity and mosaicism circumferentially  Adequate: Yes  Specimens: LEEP specimens and ECC   Condition: Stable  Complications: None  Plan: The patient was advised to call for any fever or for prolonged or severe pain or bleeding. She was advised to use OTC analgesics as needed for mild to moderate pain. Pelvic rest was advised until after her four week post operative visit.   Patient to come back for nexplanon removal and placement of  a new one.   Cornelia Copa MD Attending Center for [employer] [facility])

## 2021-07-28 ENCOUNTER — Other Ambulatory Visit: Payer: Self-pay

## 2021-07-28 ENCOUNTER — Encounter: Payer: Self-pay | Admitting: Obstetrics and Gynecology

## 2021-07-28 ENCOUNTER — Ambulatory Visit (INDEPENDENT_AMBULATORY_CARE_PROVIDER_SITE_OTHER): Payer: Medicaid Other | Admitting: Obstetrics and Gynecology

## 2021-07-28 ENCOUNTER — Encounter: Payer: Self-pay | Admitting: Family Medicine

## 2021-07-28 VITALS — BP 128/88 | HR 82 | Wt 198.1 lb

## 2021-07-28 DIAGNOSIS — Z30017 Encounter for initial prescription of implantable subdermal contraceptive: Secondary | ICD-10-CM

## 2021-07-28 DIAGNOSIS — Z3046 Encounter for surveillance of implantable subdermal contraceptive: Secondary | ICD-10-CM | POA: Diagnosis not present

## 2021-07-28 HISTORY — PX: REMOVAL OF IMPLANON ROD: OBO 1006

## 2021-07-28 MED ORDER — KETOROLAC TROMETHAMINE 10 MG PO TABS: 10.0000 mg | ORAL_TABLET | Freq: Four times a day (QID) | ORAL | 0 refills | Status: AC | PRN

## 2021-07-28 MED ORDER — ETONOGESTREL 68 MG ~~LOC~~ IMPL
68.0000 mg | DRUG_IMPLANT | Freq: Once | SUBCUTANEOUS | Status: AC
  Administered 2021-07-28: 68 mg via SUBCUTANEOUS

## 2021-07-30 ENCOUNTER — Encounter: Payer: Self-pay | Admitting: Obstetrics and Gynecology

## 2021-07-30 NOTE — Procedures (Signed)
Nexplanon Removal Procedure Note Patient had her last child in September 2019 with [employer] and states she got her Nexplanon placed by them at her regular PP visit. She likes it and desires another one  Prior to the procedure being performed, the patient (or guardian) was asked to state their full name, date of birth, type of procedure being performed and the exact location of the operative site. This information was then checked against the documentation in the patient's chart. Prior to the procedure being performed, a "time out" was performed by the physician that confirmed the correct patient, procedure and site.  After informed consent was obtained, the patient's left arm was examined and the Nexplanon rod was noted to be easily palpable. The area was cleaned with alcohol then local anesthesia was infiltrated with 3 ml of 1% lidocaine with epinephrine. The area was prepped with betadine. Using sterile technique, the Nexplanon device was brought to the incision site. The capsule was scrapped off with the scalpel, the Nexplanon grasped with hemostats, and easily removed; the removal site was hemostatic. The Nexplanon was inspected and noted to be intact.  The area was cleaned with alcohol then local anesthesia was infiltrated with 3 ml of 1% lidocaine and epinephrine along the planned insertion track. The area was prepped with betadine. Using sterile technique the Nexplanon device was inserted per manufacturer's guidelines in the subdermal connective tissue using the standard insertion technique without difficulty. Pressure was applied and the insertion site was hemostatic. The presence of the Nexplanon was confirmed immediately after insertion by palpation by both me and the patient and by checking the tip of needle for the absence of the insert.  A pressure dressing was applied.  The patient tolerated the procedure well.  Patient having some discomfort and pain and VB.  Pelvic exam done and LEEP bed is  healing great and nttp to with qmonth and no VB or discharge with some old monsel's in the vault. Work note written for patient to be out a week  Asbury Bing, Montez Hageman MD Attending Center for [employer] [facility])

## 2021-07-31 LAB — SURGICAL PATHOLOGY

## 2021-08-02 ENCOUNTER — Telehealth: Payer: Self-pay

## 2021-08-02 NOTE — Telephone Encounter (Signed)
Called pt to follow up on symptoms reported through MyChart. Pt agrees to take Tylenol and ibuprofen rotating for next 24 hours. Will also take Miralax as patient reports some constipation. Will call pt to follow up tomorrow.

## 2021-08-03 NOTE — Telephone Encounter (Signed)
Called pt; unable to leave VM. MyChart message sent.  

## 2021-08-23 ENCOUNTER — Encounter: Payer: Self-pay | Admitting: Family Medicine

## 2021-08-23 ENCOUNTER — Other Ambulatory Visit: Payer: Self-pay

## 2021-08-23 ENCOUNTER — Ambulatory Visit (INDEPENDENT_AMBULATORY_CARE_PROVIDER_SITE_OTHER): Payer: Medicaid Other | Admitting: Family Medicine

## 2021-08-23 ENCOUNTER — Other Ambulatory Visit (HOSPITAL_COMMUNITY)
Admission: RE | Admit: 2021-08-23 | Discharge: 2021-08-23 | Disposition: A | Discharge status: Home / Self Care | Payer: Medicaid Other | Source: Ambulatory Visit | Attending: Family Medicine | Admitting: Family Medicine

## 2021-08-23 DIAGNOSIS — F419 Anxiety disorder, unspecified: Secondary | ICD-10-CM | POA: Diagnosis not present

## 2021-08-23 DIAGNOSIS — N898 Other specified noninflammatory disorders of vagina: Secondary | ICD-10-CM

## 2021-08-23 DIAGNOSIS — Z975 Presence of (intrauterine) contraceptive device: Secondary | ICD-10-CM | POA: Insufficient documentation

## 2021-08-23 DIAGNOSIS — N871 Moderate cervical dysplasia: Secondary | ICD-10-CM

## 2021-08-23 DIAGNOSIS — Z5941 Food insecurity: Secondary | ICD-10-CM | POA: Diagnosis not present

## 2021-08-23 NOTE — Progress Notes (Signed)
GYNECOLOGY OFFICE VISIT NOTE  History:   Margaret Cline is a 30 y.o. [id number] here today for LEEP follow up.  LEEP for CIN 2 on 07/26/2021 Then had Nexplanon removal-reinsertion on 07/28/2021  Today reports small amount of brown disharge Unsure if its old blood Reports she typically has some spotting for a few weeks when she gets her nexplanon exchanged No bright red bleeding Normally doesn't have any bleeding on nexplanon Occasional ramping on and off No odor No discharge that makes her suspicious for infection but would like a swab anyuways  Health Maintenance Due  Topic Date Due   COVID-19 Vaccine (1) Never done   Hepatitis C Screening  Never done   Pneumococcal Vaccine 95-64 Years old (2 - PCV) 07/03/2019    Past Medical History:  Diagnosis Date   Anemia    Fe x2   Asthma    Chlamydia 2008   Chronic pain    Constipation, chronic 11/13/2011   Depression    postpartum - no meds   Gonorrhea    History of chlamydia 03/07/2012   History of trichomoniasis 03/07/2012   Hx: UTI (urinary tract infection)    Frequently   Hypertension    Irregular periods/menstrual cycles    Migraines    Migraines - no med    Obese    Trichomonas 2008   Vaginal delivery--shoulder dystocia 07/10/2012    Past Surgical History:  Procedure Laterality Date   INDUCED ABORTION     REMOVAL OF IMPLANON ROD  07/28/2021   REMOVAL OF IMPLANON ROD  07/28/2021   WISDOM TOOTH EXTRACTION      The following portions of the patient's history were reviewed and updated as appropriate: allergies, current medications, past family history, past medical history, past social history, past surgical history and problem list.   Health Maintenance:   Last pap: Lab Results  Component Value Date   DIAGPAP  12/10/2016    NEGATIVE FOR INTRAEPITHELIAL LESIONS OR MALIGNANCY.   HSIL pap 02/2021  Last mammogram:  N/a    Review of Systems:  Pertinent items noted in HPI and remainder of comprehensive ROS  otherwise negative.  Physical Exam:  BP 111/82   Pulse 94   Ht 5\' 8"  (1.727 m)   Wt 190 lb 3.2 oz (86.3 kg)   BMI 28.92 kg/m  CONSTITUTIONAL: Well-developed, well-nourished female in no acute distress.  HEENT:  Normocephalic, atraumatic. External right and left ear normal. No scleral icterus.  NECK: Normal range of motion, supple, no masses noted on observation SKIN: No rash noted. Not diaphoretic. No erythema. No pallor. MUSCULOSKELETAL: Normal range of motion. No edema noted. NEUROLOGIC: Alert and oriented to person, place, and time. Normal muscle tone coordination.  PSYCHIATRIC: Normal mood and affect. Normal behavior. Normal judgment and thought content. RESPIRATORY: Effort normal, no problems with respiration noted  PELVIC: Normal appearing external genitalia; normal appearing vaginal mucosa and cervix. Completely healed cervix. No blood or abnormal discharge noted.  Labs and Imaging No results found for this or any previous visit (from the past 168 hour(s)). No results found.    Assessment and Plan:   Problem List Items Addressed This Visit       Genitourinary   CIN II (cervical intraepithelial neoplasia II)    LEEP bed completely healed, reviewed pathology with patient that is reassuring, reminded her of importance of 6 month pap follow up. No discharge noted on exam but swab collected per patient request.  Other   Nexplanon in place    No blood or other discharge noted on exam, likely spotting related to exchange of Nexplanon, plan for watchful waiting at present, discussed we can do a round of OCP's to stabilize uterine lining if needed.      Other Visit Diagnoses     Anxiety       Relevant Orders   Ambulatory referral to Integrated Behavioral Health   Vaginal irritation       Relevant Orders   Cervicovaginal ancillary only( [place])   Food insecurity       Relevant Orders   AMBULATORY REFERRAL TO [facility]       Routine preventative  health maintenance measures emphasized. Please refer to After Visit Summary for other counseling recommendations.   Return in about 4 months (around 12/21/2021) for repeat pap.    Total face-to-face time with patient: 25 minutes.  Over 50% of encounter was spent on counseling and coordination of care.   Venora Maples, MD/MPH Attending Family Medicine Physician, [facility] for [facility], [facility]]

## 2021-08-23 NOTE — Assessment & Plan Note (Signed)
No blood or other discharge noted on exam, likely spotting related to exchange of Nexplanon, plan for watchful waiting at present, discussed we can do a round of OCP's to stabilize uterine lining if needed.

## 2021-08-23 NOTE — Progress Notes (Signed)
Patient in today for LEEP follow up, procedure date was 10/12. Patient also had Nexplanon taken out on 10/14. States that since LEEP/ Nexplanon insertion she has had cramping and brown discharge. Unsure if this is related to LEEP or Nexplanon. No other complaints at this time. Declines Flu shot   [place], [place]

## 2021-08-23 NOTE — Assessment & Plan Note (Addendum)
LEEP bed completely healed, reviewed pathology with patient that is reassuring, reminded her of importance of 6 month pap follow up. No discharge noted on exam but swab collected per patient request.

## 2021-08-24 LAB — CERVICOVAGINAL ANCILLARY ONLY
Bacterial Vaginitis (gardnerella): POSITIVE — AB
Candida Glabrata: NEGATIVE
Candida Vaginitis: NEGATIVE
Chlamydia: NEGATIVE
Comment: NEGATIVE
Comment: NEGATIVE
Comment: NEGATIVE
Comment: NEGATIVE
Comment: NEGATIVE
Comment: NORMAL
Neisseria Gonorrhea: NEGATIVE
Trichomonas: NEGATIVE

## 2021-08-24 MED ORDER — METRONIDAZOLE 500 MG PO TABS: 500.0000 mg | ORAL_TABLET | Freq: Two times a day (BID) | ORAL | 0 refills | Status: AC

## 2021-08-24 NOTE — Addendum Note (Signed)
Addended by: Merian Capron on: 08/24/2021 01:50 PM   Modules accepted: Orders

## 2021-08-24 NOTE — BH Specialist Note (Signed)
Integrated Behavioral Health via Telemedicine Visit  08/24/2021 Margaret Cline [id number]  Number of Integrated Behavioral Health visits: 1 Session Start time: 4:09  Session End time: 4:58 Total time:  74  Referring Provider: Merian Capron, MD Patient/Family location: Home [facility] Provider location: [facility]  All persons participating in visit: Patient Margaret Cline and Margaret Cline   Types of Service: Individual psychotherapy and Video visit  I connected with Margaret Cline and/or Margaret Cline's  n/a  via  Telephone or Video Enabled Telemedicine Application  (Video is Caregility application) and verified that I am speaking with the correct person using two identifiers. Discussed confidentiality: Yes   I discussed the limitations of telemedicine and the availability of in person appointments.  Discussed there is a possibility of technology failure and discussed alternative modes of communication if that failure occurs.  I discussed that engaging in this telemedicine visit, they consent to the provision of behavioral healthcare and the services will be billed under their insurance.  Patient and/or legal guardian expressed understanding and consented to Telemedicine visit: Yes   Presenting Concerns: Patient and/or family reports the following symptoms/concerns: Increased feelings of depression and anxiety, attributed to recent surgery/health scare; goal is to move towards overall wellness; open to learning self-coping strategies  Duration of problem: Increase over one month Severity of problem: moderate  Patient and/or Family's Strengths/Protective Factors: Social connections, Concrete supports in place (healthy food, safe environments, etc.), and Sense of purpose  Goals Addressed: Patient will:  Reduce symptoms of: anxiety, depression, and stress   Increase knowledge and/or ability of: coping skills and stress reduction    Demonstrate ability to: Increase healthy adjustment to current life circumstances  Progress towards Goals: Ongoing  Interventions: Interventions utilized:  ACT (Acceptance and Commitment Therapy) Standardized Assessments completed:  PHQ9/GAD7 given in past 2 weeks  Patient and/or Family Response: Pt agrees with treatment plan  Assessment: Patient currently experiencing History of Major depressive disorder, recurrent, moderate.   Patient may benefit from psychoeducation and brief therapeutic interventions regarding coping with symptoms of anxiety, depression, stress .  Plan: Follow up with behavioral health clinician on : Two weeks Behavioral recommendations:  -Continue church involvement and music for ongoing self-care -CALM relaxation breathing exercise twice daily (morning; at bedtime with sleep sounds) -Begin Worry Time strategy; set timers on phone for beginning and end time daily  Referral(s): Integrated [employer] (In Clinic)  I discussed the assessment and treatment plan with the patient and/or parent/guardian. They were provided an opportunity to ask questions and all were answered. They agreed with the plan and demonstrated an understanding of the instructions.   They were advised to call back or seek an in-person evaluation if the symptoms worsen or if the condition fails to improve as anticipated.  Rae Lips, LCSW  Depression screen [facility] 2/9 08/23/2021 07/28/2021 06/04/2019 05/12/2019  Decreased Interest 2 1 0 0  Down, Depressed, Hopeless 1 1 0 0  PHQ - 2 Score 3 2 0 0  Altered sleeping 2 1 - -  Tired, decreased energy 2 1 - -  Change in appetite 2 1 - -  Feeling bad or failure about yourself  1 1 - -  Trouble concentrating 0 1 - -  Moving slowly or fidgety/restless 0 0 - -  Suicidal thoughts 0 0 - -  PHQ-9 Score 10 7 - -  Some encounter information is confidential and restricted. Go to Review Flowsheets activity to  see all data.   GAD 7 :  Generalized Anxiety Score 08/23/2021 07/28/2021 07/28/2021  Nervous, Anxious, on Edge 2 0 1  Control/stop worrying 2 - 1  Worry too much - different things 2 - 1  Trouble relaxing 2 - 1  Restless 2 - 1  Easily annoyed or irritable 2 - 1  Afraid - awful might happen 2 - 1  Total GAD 7 Score 14 - 7  Some encounter information is confidential and restricted. Go to Review Flowsheets activity to see all data.

## 2021-09-04 ENCOUNTER — Ambulatory Visit: Payer: Self-pay | Admitting: Clinical

## 2021-09-04 DIAGNOSIS — F331 Major depressive disorder, recurrent, moderate: Secondary | ICD-10-CM

## 2021-09-04 NOTE — Patient Instructions (Signed)
[facility] at [facility] [address] [place], [place] [address] [phone] (main office) [phone] (Giani Betzold's office)   

## 2021-09-13 NOTE — BH Specialist Note (Signed)
Integrated Behavioral Health via Telemedicine Visit  09/13/2021 Margaret Cline [id number]  Number of Integrated Behavioral Health visits: 2 Session Start time: 3:52  Session End time: 4:02 Total time:  10  Referring Provider: Merian Capron, MD Patient/Family location: [place], [place] [facility] Provider location: Center for [facility]  All persons participating in visit: Patient Margaret Cline and [employer] Margaret Cline   Types of Service: Individual psychotherapy and Telephone visit  I connected with Margaret Cline and/or Margaret Cline's  n/a  via  Telephone or Video Enabled Telemedicine Application  (Video is Caregility application) and verified that I am speaking with the correct person using two identifiers. Discussed confidentiality: Yes   I discussed the limitations of telemedicine and the availability of in person appointments.  Discussed there is a possibility of technology failure and discussed alternative modes of communication if that failure occurs.  I discussed that engaging in this telemedicine visit, they consent to the provision of behavioral healthcare and the services will be billed under their insurance.  Patient and/or legal guardian expressed understanding and consented to Telemedicine visit: Yes   Presenting Concerns: Patient and/or family reports the following symptoms/concerns: Grieving loss of paternal uncle on Sunday and preparing for service on upcoming Saturday, along with coping with other family health issues; pt using breathing exercises to positive effect in midst of grief.  Duration of problem: Less than one week since loss  Patient and/or Family's Strengths/Protective Factors: Social connections, Concrete supports in place (healthy food, safe environments, etc.), and Sense of purpose  Goals Addressed: Patient will:  Demonstrate ability to: Begin healthy grieving over loss  Progress towards  Goals: Ongoing  Interventions: Interventions utilized:  Supportive Counseling Standardized Assessments completed: Not Needed  Patient and/or Family Response: Pt agrees with treatment plan  Assessment: Patient currently experiencing Grief today, along with MDD.   Patient may benefit from supportive counsel today.  Plan: Follow up with behavioral health clinician on : Two weeks Behavioral recommendations:  -Continue to allow time and space for grieving loss -Continue using relaxation breathing and prioritizing healthy sleep as much as needed the next two weeks -Consider additional grief resources to share with family/extended family, as needed Referral(s): Integrated [employer] (In Clinic)  I discussed the assessment and treatment plan with the patient and/or parent/guardian. They were provided an opportunity to ask questions and all were answered. They agreed with the plan and demonstrated an understanding of the instructions.   They were advised to call back or seek an in-person evaluation if the symptoms worsen or if the condition fails to improve as anticipated.  Margaret Close Pailyn [place], LCSW

## 2021-09-21 ENCOUNTER — Ambulatory Visit (INDEPENDENT_AMBULATORY_CARE_PROVIDER_SITE_OTHER): Payer: Medicaid Other | Admitting: Clinical

## 2021-09-21 ENCOUNTER — Other Ambulatory Visit: Payer: Self-pay

## 2021-09-21 DIAGNOSIS — F331 Major depressive disorder, recurrent, moderate: Secondary | ICD-10-CM

## 2021-09-21 DIAGNOSIS — F4321 Adjustment disorder with depressed mood: Secondary | ICD-10-CM

## 2021-09-21 NOTE — Patient Instructions (Signed)
[facility] for Women [address] [place], [place] [address] [phone] (main office) [phone] (Isa Kohlenberg's office)  [facility] (Individual and group grief support; [facility] also available for children)   [email or web address]  [phone]

## 2021-09-22 NOTE — BH Specialist Note (Deleted)
Integrated Behavioral Health via Telemedicine Visit  09/22/2021 Margaret Cline [id number]  Number of Integrated Behavioral Health visits: *** Session Start time: 3:45***  Session End time: 4:15*** Total time: {IBH Total Time:21014050}  Referring Provider: *** Patient/Family location: Home*** [facility] Provider location: [facility]  All persons participating in visit: Patient *** and Margaret Cline ***  Types of Service: {CHL AMB TYPE OF SERVICE:[phone]}  I connected with Margaret Cline and/or Margaret Cline's {family members:20773} via  Telephone or Engineer, civil (consulting)  (Video is Caregility application) and verified that I am speaking with the correct person using two identifiers. Discussed confidentiality: {YES/NO:21197}  I discussed the limitations of telemedicine and the availability of in person appointments.  Discussed there is a possibility of technology failure and discussed alternative modes of communication if that failure occurs.  I discussed that engaging in this telemedicine visit, they consent to the provision of behavioral healthcare and the services will be billed under their insurance.  Patient and/or legal guardian expressed understanding and consented to Telemedicine visit: {YES/NO:21197}  Presenting Concerns: Patient and/or family reports the following symptoms/concerns: *** Duration of problem: ***; Severity of problem: {Mild/Moderate/Severe:20260}  Patient and/or Family's Strengths/Protective Factors: {CHL AMB BH PROTECTIVE [phone]}  Goals Addressed: Patient will:  Reduce symptoms of: {IBH Symptoms:21014056}   Increase knowledge and/or ability of: {IBH Patient Tools:21014057}   Demonstrate ability to: {IBH Goals:21014053}  Progress towards Goals: {CHL AMB BH PROGRESS TOWARDS GOALS:[phone]}  Interventions: Interventions utilized:  {IBH Interventions:21014054} Standardized  Assessments completed: {IBH Screening Tools:21014051}  Patient and/or Family Response: ***  Assessment: Patient currently experiencing ***.   Patient may benefit from ***.  Plan: Follow up with behavioral health clinician on : *** Behavioral recommendations: *** Referral(s): {IBH Referrals:21014055}  I discussed the assessment and treatment plan with the patient and/or parent/guardian. They were provided an opportunity to ask questions and all were answered. They agreed with the plan and demonstrated an understanding of the instructions.   They were advised to call back or seek an in-person evaluation if the symptoms worsen or if the condition fails to improve as anticipated.  Margaret Close Clydell Alberts, LCSW

## 2021-10-05 ENCOUNTER — Other Ambulatory Visit (HOSPITAL_COMMUNITY)
Admission: RE | Admit: 2021-10-05 | Discharge: 2021-10-05 | Disposition: A | Discharge status: Home / Self Care | Payer: Medicaid Other | Source: Ambulatory Visit | Attending: Obstetrics and Gynecology | Admitting: Obstetrics and Gynecology

## 2021-10-05 ENCOUNTER — Other Ambulatory Visit: Payer: Self-pay

## 2021-10-05 ENCOUNTER — Ambulatory Visit (INDEPENDENT_AMBULATORY_CARE_PROVIDER_SITE_OTHER): Payer: Medicaid Other

## 2021-10-05 VITALS — BP 113/71 | HR 91 | Wt 189.6 lb

## 2021-10-05 DIAGNOSIS — N898 Other specified noninflammatory disorders of vagina: Secondary | ICD-10-CM | POA: Diagnosis not present

## 2021-10-05 NOTE — Progress Notes (Signed)
Here today for vaginal self swab. Patient reports ongoing brown vaginal discharge and abdominal cramping since LEEP on 07/26/21. Has completed course of Flagyl since positive BV on 08/23/21. Requests wet prep with STD screening. Self swab instructions given and specimen obtained. Explained office will contact patient with any abnormal result.   Fleet Contras RN 10/05/21

## 2021-10-06 LAB — CERVICOVAGINAL ANCILLARY ONLY
Bacterial Vaginitis (gardnerella): POSITIVE — AB
Candida Glabrata: NEGATIVE
Candida Vaginitis: POSITIVE — AB
Chlamydia: NEGATIVE
Comment: NEGATIVE
Comment: NEGATIVE
Comment: NEGATIVE
Comment: NEGATIVE
Comment: NEGATIVE
Comment: NORMAL
Neisseria Gonorrhea: NEGATIVE
Trichomonas: NEGATIVE

## 2021-10-11 ENCOUNTER — Telehealth: Payer: Self-pay | Admitting: Lactation Services

## 2021-10-11 MED ORDER — FLUCONAZOLE 150 MG PO TABS: 150.0000 mg | ORAL_TABLET | Freq: Once | ORAL | 0 refills | Status: AC

## 2021-10-11 MED ORDER — METRONIDAZOLE 500 MG PO TABS: 500.0000 mg | ORAL_TABLET | Freq: Two times a day (BID) | ORAL | 0 refills | Status: DC

## 2021-10-11 NOTE — Telephone Encounter (Signed)
Called patient to inform her of results of vaginal swab. She would like treatment for both BV and Yeast.   Advised to take entire course of ATB and take with food. Reviewed not drinking alcohol while taking. Advised to take Diflucan at end of ATB course to treat the yeast. Patient voiced understanding.

## 2021-10-11 NOTE — Telephone Encounter (Signed)
-----   Message from Warden Fillers, MD sent at 10/10/2021  9:18 PM EST ----- Yeast and BV noted on swab, offer treatment

## 2022-01-16 ENCOUNTER — Encounter: Payer: Self-pay | Admitting: Obstetrics & Gynecology

## 2022-03-19 ENCOUNTER — Encounter: Payer: Self-pay | Admitting: Obstetrics and Gynecology

## 2022-03-19 ENCOUNTER — Other Ambulatory Visit (HOSPITAL_COMMUNITY)
Admission: RE | Admit: 2022-03-19 | Discharge: 2022-03-19 | Disposition: A | Discharge status: Home / Self Care | Payer: Medicaid Other | Source: Ambulatory Visit | Attending: Obstetrics and Gynecology | Admitting: Obstetrics and Gynecology

## 2022-03-19 ENCOUNTER — Ambulatory Visit: Payer: Medicaid Other | Admitting: Obstetrics and Gynecology

## 2022-03-19 VITALS — BP 117/79 | HR 71 | Wt 189.0 lb

## 2022-03-19 DIAGNOSIS — N879 Dysplasia of cervix uteri, unspecified: Secondary | ICD-10-CM | POA: Insufficient documentation

## 2022-03-19 DIAGNOSIS — N760 Acute vaginitis: Secondary | ICD-10-CM | POA: Diagnosis not present

## 2022-03-19 DIAGNOSIS — B3731 Acute candidiasis of vulva and vagina: Secondary | ICD-10-CM | POA: Diagnosis not present

## 2022-03-19 HISTORY — PX: COLPOSCOPY W/ BIOPSY / CURETTAGE: SUR283

## 2022-03-19 NOTE — Procedures (Signed)
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 Yeast infection  Procedure Details  The risks (including infection, bleeding, pain) and benefits of the procedure were explained to the patient and written informed consent was obtained.  The patient was placed in the dorsal lithotomy position. A Pederson was speculum inserted in the vagina, and the cervix was visualized.  Acetic acid staining was done and the cervix was viewed with green filter  Biopsy from 7 and 5 o'clock  and then single toothed tenaculum applied and endocervical curettage in all four quadrants done. There was no bleeding after procedure.   Findings: awe changes on the lower part of the cervical lip  Adequate: yes  Specimens: biopsy from 7 and 5 o'clock (sent together) and ecc  Condition: Stable  Complications: None  Plan: The patient was advised to call for any fever or for prolonged or severe pain or bleeding. She was advised to use OTC analgesics as needed for mild to moderate pain. She was advised to avoid vaginal intercourse for 48 hours or until the bleeding has completely stopped.   Durene Romans MD Attending [facility] [employer] [facility])

## 2022-03-19 NOTE — Progress Notes (Signed)
See procedure note.

## 2022-03-20 ENCOUNTER — Encounter: Payer: Self-pay | Admitting: *Deleted

## 2022-03-20 LAB — CERVICOVAGINAL ANCILLARY ONLY
Bacterial Vaginitis (gardnerella): POSITIVE — AB
Candida Glabrata: NEGATIVE
Candida Vaginitis: NEGATIVE
Chlamydia: NEGATIVE
Comment: NEGATIVE
Comment: NEGATIVE
Comment: NEGATIVE
Comment: NEGATIVE
Comment: NEGATIVE
Comment: NORMAL
Neisseria Gonorrhea: NEGATIVE
Trichomonas: NEGATIVE

## 2022-03-20 MED ORDER — METRONIDAZOLE 500 MG PO TABS: 500.0000 mg | ORAL_TABLET | Freq: Two times a day (BID) | ORAL | 0 refills | Status: DC

## 2022-03-20 NOTE — Addendum Note (Signed)
Addended by: Aletha Halim on: 03/20/2022 06:40 PM   Modules accepted: Orders

## 2022-03-21 LAB — SURGICAL PATHOLOGY

## 2022-03-24 ENCOUNTER — Encounter: Payer: Self-pay | Admitting: Obstetrics and Gynecology

## 2022-06-02 LAB — GLUCOSE, POCT (MANUAL RESULT ENTRY): POC Glucose: 103 mg/dl — AB (ref 70–99)

## 2022-07-01 ENCOUNTER — Telehealth: Payer: Medicaid Other | Admitting: Family

## 2022-07-01 DIAGNOSIS — J4521 Mild intermittent asthma with (acute) exacerbation: Secondary | ICD-10-CM

## 2022-07-01 DIAGNOSIS — B9689 Other specified bacterial agents as the cause of diseases classified elsewhere: Secondary | ICD-10-CM | POA: Diagnosis not present

## 2022-07-01 DIAGNOSIS — J208 Acute bronchitis due to other specified organisms: Secondary | ICD-10-CM | POA: Diagnosis not present

## 2022-07-01 MED ORDER — ONDANSETRON HCL 4 MG PO TABS: 4.0000 mg | ORAL_TABLET | Freq: Three times a day (TID) | ORAL | 0 refills | Status: DC | PRN

## 2022-07-01 MED ORDER — BENZONATATE 100 MG PO CAPS: 100.0000 mg | ORAL_CAPSULE | Freq: Three times a day (TID) | ORAL | 0 refills | Status: DC | PRN

## 2022-07-01 MED ORDER — PREDNISONE 10 MG (21) PO TBPK: ORAL_TABLET | ORAL | 0 refills | Status: DC

## 2022-07-01 MED ORDER — AZITHROMYCIN 250 MG PO TABS: ORAL_TABLET | ORAL | 0 refills | Status: DC

## 2022-07-01 NOTE — Progress Notes (Signed)
Virtual Visit Consent   [name], you are scheduled for a virtual visit with a [facility] provider today. Just as with appointments in the office, your consent must be obtained to participate. Your consent will be active for this visit and any virtual visit you may have with one of our providers in the next 365 days. If you have a MyChart account, a copy of this consent can be sent to you electronically.  As this is a virtual visit, video technology does not allow for your provider to perform a traditional examination. This may limit your provider's ability to fully assess your condition. If your provider identifies any concerns that need to be evaluated in person or the need to arrange testing (such as labs, EKG, etc.), we will make arrangements to do so. Although advances in technology are sophisticated, we cannot ensure that it will always work on either your end or our end. If the connection with a video visit is poor, the visit may have to be switched to a telephone visit. With either a video or telephone visit, we are not always able to ensure that we have a secure connection.  By engaging in this virtual visit, you consent to the provision of healthcare and authorize for your insurance to be billed (if applicable) for the services provided during this visit. Depending on your insurance coverage, you may receive a charge related to this service.  I need to obtain your verbal consent now. Are you willing to proceed with your visit today? Margaret Cline has provided verbal consent on 07/01/2022 for a virtual visit (video or telephone). Jannifer Rodney, FNP  Date: 07/01/2022 10:44 AM  Virtual Visit via Video Note   I, Jannifer Rodney, connected with  Margaret Cline  ([id number], 04-23-1991) on 07/01/22 at 10:45 AM EDT by a video-enabled telemedicine application and verified that I am speaking with the correct person using two identifiers.  Location: Patient: Virtual Visit Location Patient:  [place] Provider: Virtual Visit Location Provider: Home Office   I discussed the limitations of evaluation and management by telemedicine and the availability of in person appointments. The patient expressed understanding and agreed to proceed.    History of Present Illness: Margaret Cline is a 31 y.o. who identifies as a female who was assigned female at birth, and is being seen today for cough for the last two weeks.  HPI: Cough This is a new problem. The current episode started 1 to 4 weeks ago. The problem has been gradually worsening. The problem occurs every few minutes. The cough is Non-productive. Associated symptoms include headaches, myalgias, nasal congestion, postnasal drip, rhinorrhea and wheezing. Pertinent negatives include no chills, ear congestion, ear pain, fever, sore throat or shortness of breath. Associated symptoms comments: Fatigue . Risk factors for lung disease include smoking/tobacco exposure. She has tried rest for the symptoms. Her past medical history is significant for asthma.  Asthma She complains of chest tightness, cough, sputum production and wheezing. There is no shortness of breath. This is a recurrent problem. The current episode started 1 to 4 weeks ago. Associated symptoms include headaches, myalgias, nasal congestion, postnasal drip and rhinorrhea. Pertinent negatives include no ear congestion, ear pain, fever or sore throat. Her past medical history is significant for asthma.    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  Cigarette nicotine dependence without complication 73/22/0254   BMI 33.0-33.9,adult 02/23/2015   Major depression, recurrent ([place] [facility]) 08/23/2013   Suicide attempt by multiple drug overdose ([place]) 08/23/2013   Bacterial vaginosis 05/08/2012   Asthma 03/20/2012   Obesity 03/07/2012     Allergies:  Allergies  Allergen Reactions   Prednisone Nausea And Vomiting   Medications:  Current Outpatient Medications:    azithromycin (ZITHROMAX) 250 MG tablet, Take 500 mg once, then 250 mg for four days, Disp: 6 tablet, Rfl: 0   benzonatate (TESSALON PERLES) 100 MG capsule, Take 1 capsule (100 mg total) by mouth 3 (three) times daily as needed., Disp: 20 capsule, Rfl: 0   ondansetron (ZOFRAN) 4 MG tablet, Take 1 tablet (4 mg total) by mouth every 8 (eight) hours as needed for nausea or vomiting., Disp: 20 tablet, Rfl: 0   predniSONE (STERAPRED UNI-PAK 21 TAB) 10 MG (21) TBPK tablet, Use as directed, Disp: 21 tablet, Rfl: 0   acetaminophen (TYLENOL) 500 MG tablet, Take 1,000 mg by mouth every 8 (eight) hours as needed for headache., Disp: , Rfl:    albuterol (PROAIR HFA) 108 (90 Base) MCG/ACT inhaler, Inhale 2 puffs into the lungs every 4 (four) hours as needed for wheezing or shortness of breath. For shortness of breath/wheezing, Disp: 18 g, Rfl: 1   albuterol (PROVENTIL) (2.5 MG/3ML) 0.083% nebulizer solution, Take 3 mLs (2.5 mg total) by nebulization every 4 (four) hours as needed for wheezing or shortness of breath., Disp: 30 vial, Rfl: 0   cyclobenzaprine (FLEXERIL) 5 MG tablet, Take 1 tablet (5 mg total) by mouth 3 (three) times daily as needed for muscle spasms., Disp: 60 tablet, Rfl: 0   lisinopril (ZESTRIL) 5 MG tablet, Take 5 mg by mouth daily., Disp: , Rfl:    loratadine (CLARITIN) 10 MG tablet, Take 1 tablet (10 mg total) by mouth daily. (Patient taking differently: Take 10 mg by mouth daily as needed for allergies or rhinitis.), Disp: 20 tablet, Rfl: 0   meloxicam (MOBIC) 15 MG tablet, Take 1 tablet (15 mg total) by mouth daily., Disp: 30 tablet, Rfl: 0  Observations/Objective: Patient is well-developed, well-nourished in no acute distress.  Resting comfortably  at home.  Head is normocephalic, atraumatic.  No labored breathing.  Speech is clear and coherent with  logical content.  Patient is alert and oriented at baseline.  Hoarse voice Intermittent coarse cough  Assessment and Plan: 1. Mild intermittent asthma with acute exacerbation - azithromycin (ZITHROMAX) 250 MG tablet; Take 500 mg once, then 250 mg for four days  Dispense: 6 tablet; Refill: 0 - predniSONE (STERAPRED UNI-PAK 21 TAB) 10 MG (21) TBPK tablet; Use as directed  Dispense: 21 tablet; Refill: 0 - ondansetron (ZOFRAN) 4 MG tablet; Take 1 tablet (4 mg total) by mouth every 8 (eight) hours as needed for nausea or vomiting.  Dispense: 20 tablet; Refill: 0 - benzonatate (TESSALON PERLES) 100 MG capsule; Take 1 capsule (100 mg total) by mouth 3 (three) times daily as needed.  Dispense: 20 capsule; Refill: 0  2. Acute bacterial bronchitis - azithromycin (ZITHROMAX) 250 MG tablet; Take 500 mg once, then 250 mg for four days  Dispense: 6 tablet; Refill: 0 - predniSONE (STERAPRED UNI-PAK 21 TAB) 10 MG (21) TBPK tablet; Use as directed  Dispense: 21 tablet; Refill: 0 - ondansetron (ZOFRAN) 4 MG tablet; Take 1 tablet (4 mg total) by mouth every 8 (eight) hours as needed for nausea or vomiting.  Dispense: 20 tablet; Refill: 0 - benzonatate (  TESSALON PERLES) 100 MG capsule; Take 1 capsule (100 mg total) by mouth 3 (three) times daily as needed.  Dispense: 20 capsule; Refill: 0  - Take meds as prescribed - Use a cool mist humidifier  -Use saline nose sprays frequently -Force fluids -For any cough or congestion  Use plain Mucinex- regular strength or max strength is fine -For fever or aces or pains- take tylenol or ibuprofen. -Throat lozenges if help -Follow up if symptoms worsen or do not improve   Follow Up Instructions: I discussed the assessment and treatment plan with the patient. The patient was provided an opportunity to ask questions and all were answered. The patient agreed with the plan and demonstrated an understanding of the instructions.  A copy of instructions were sent to the  patient via MyChart unless otherwise noted below.     The patient was advised to call back or seek an in-person evaluation if the symptoms worsen or if the condition fails to improve as anticipated.  Time:  I spent 11 minutes with the patient via telehealth technology discussing the above problems/concerns.    Jannifer Rodney, FNP

## 2022-08-07 ENCOUNTER — Telehealth: Payer: Medicaid Other | Admitting: Physician Assistant

## 2022-08-07 DIAGNOSIS — N760 Acute vaginitis: Secondary | ICD-10-CM

## 2022-08-07 DIAGNOSIS — B9689 Other specified bacterial agents as the cause of diseases classified elsewhere: Secondary | ICD-10-CM | POA: Diagnosis not present

## 2022-08-07 MED ORDER — METRONIDAZOLE 500 MG PO TABS: 500.0000 mg | ORAL_TABLET | Freq: Two times a day (BID) | ORAL | 0 refills | Status: AC

## 2022-08-07 NOTE — Progress Notes (Signed)
Virtual Visit Consent   Del Flow, you are scheduled for a virtual visit with a [facility] provider today. Just as with appointments in the office, your consent must be obtained to participate. Your consent will be active for this visit and any virtual visit you may have with one of our providers in the next 365 days. If you have a MyChart account, a copy of this consent can be sent to you electronically.  As this is a virtual visit, video technology does not allow for your provider to perform a traditional examination. This may limit your provider's ability to fully assess your condition. If your provider identifies any concerns that need to be evaluated in person or the need to arrange testing (such as labs, EKG, etc.), we will make arrangements to do so. Although advances in technology are sophisticated, we cannot ensure that it will always work on either your end or our end. If the connection with a video visit is poor, the visit may have to be switched to a telephone visit. With either a video or telephone visit, we are not always able to ensure that we have a secure connection.  By engaging in this virtual visit, you consent to the provision of healthcare and authorize for your insurance to be billed (if applicable) for the services provided during this visit. Depending on your insurance coverage, you may receive a charge related to this service.  I need to obtain your verbal consent now. Are you willing to proceed with your visit today? Margaret Cline has provided verbal consent on 08/07/2022 for a virtual visit (video or telephone). Margaret Loveless, PA-C  Date: 08/07/2022 8:59 AM  Virtual Visit via Video Note   I, Margaret Cline, connected with  Margaret Cline  ([id number], January 21, 1991) on 08/07/22 at  9:00 AM EDT by a video-enabled telemedicine application and verified that I am speaking with the correct person using two identifiers.  Location: Patient: Virtual Visit Location  Patient: Home Provider: Virtual Visit Location Provider: Home Office   I discussed the limitations of evaluation and management by telemedicine and the availability of in person appointments. The patient expressed understanding and agreed to proceed.    History of Present Illness: Margaret Cline is a 31 y.o. who identifies as a female who was assigned female at birth, and is being seen today for vaginal discharge.  HPI: Vaginal Discharge The patient's primary symptoms include a genital odor and vaginal discharge. This is a new problem. The current episode started in the past 7 days. The problem occurs constantly. The problem has been gradually worsening. The pain is mild. Associated symptoms include abdominal pain (pelvic cramping). Pertinent negatives include no fever, flank pain, frequency, hematuria, nausea or vomiting. The vaginal discharge was white, malodorous, thin and milky. There has been no bleeding. She has not been passing clots. She has not been passing tissue. Nothing aggravates the symptoms. She has tried antifungals (gets fluconazole prescribed for antibiotic induced yeast infections; tried fluconazole with no change in symptoms) for the symptoms. The treatment provided no relief.     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   Cigarette nicotine dependence without complication 02/23/2015   BMI 33.0-33.9,adult 02/23/2015   Major depression, recurrent ([facility]) 08/23/2013   Suicide attempt by multiple drug overdose ([facility]) 08/23/2013   Bacterial vaginosis 05/08/2012  Asthma 03/20/2012   Obesity 03/07/2012    Allergies:  Allergies  Allergen Reactions   Prednisone Nausea And Vomiting   Medications:  Current Outpatient Medications:    metroNIDAZOLE (FLAGYL) 500 MG tablet, Take 1 tablet (500 mg total) by mouth 2 (two)  times daily for 7 days., Disp: 14 tablet, Rfl: 0   acetaminophen (TYLENOL) 500 MG tablet, Take 1,000 mg by mouth every 8 (eight) hours as needed for headache., Disp: , Rfl:    albuterol (PROAIR HFA) 108 (90 Base) MCG/ACT inhaler, Inhale 2 puffs into the lungs every 4 (four) hours as needed for wheezing or shortness of breath. For shortness of breath/wheezing, Disp: 18 g, Rfl: 1   albuterol (PROVENTIL) (2.5 MG/3ML) 0.083% nebulizer solution, Take 3 mLs (2.5 mg total) by nebulization every 4 (four) hours as needed for wheezing or shortness of breath., Disp: 30 vial, Rfl: 0   azithromycin (ZITHROMAX) 250 MG tablet, Take 500 mg once, then 250 mg for four days, Disp: 6 tablet, Rfl: 0   benzonatate (TESSALON PERLES) 100 MG capsule, Take 1 capsule (100 mg total) by mouth 3 (three) times daily as needed., Disp: 20 capsule, Rfl: 0   cyclobenzaprine (FLEXERIL) 5 MG tablet, Take 1 tablet (5 mg total) by mouth 3 (three) times daily as needed for muscle spasms., Disp: 60 tablet, Rfl: 0   lisinopril (ZESTRIL) 5 MG tablet, Take 5 mg by mouth daily., Disp: , Rfl:    loratadine (CLARITIN) 10 MG tablet, Take 1 tablet (10 mg total) by mouth daily. (Patient taking differently: Take 10 mg by mouth daily as needed for allergies or rhinitis.), Disp: 20 tablet, Rfl: 0   meloxicam (MOBIC) 15 MG tablet, Take 1 tablet (15 mg total) by mouth daily., Disp: 30 tablet, Rfl: 0   ondansetron (ZOFRAN) 4 MG tablet, Take 1 tablet (4 mg total) by mouth every 8 (eight) hours as needed for nausea or vomiting., Disp: 20 tablet, Rfl: 0   predniSONE (STERAPRED UNI-PAK 21 TAB) 10 MG (21) TBPK tablet, Use as directed, Disp: 21 tablet, Rfl: 0  Observations/Objective: Patient is well-developed, well-nourished in no acute distress.  Resting comfortably at home.  Head is normocephalic, atraumatic.  No labored breathing.  Speech is clear and coherent with logical content.  Patient is alert and oriented at baseline.    Assessment and Plan: 1.  BV (bacterial vaginosis) - metroNIDAZOLE (FLAGYL) 500 MG tablet; Take 1 tablet (500 mg total) by mouth 2 (two) times daily for 7 days.  Dispense: 14 tablet; Refill: 0  - Symptoms consistent with BV - Metronidazole prescribed - Limit bubble baths, scented lotions/soaps/detergents - Limit tight fitting clothing - Seek on person evaluation if not improving or if symptoms worsen   Follow Up Instructions: I discussed the assessment and treatment plan with the patient. The patient was provided an opportunity to ask questions and all were answered. The patient agreed with the plan and demonstrated an understanding of the instructions.  A copy of instructions were sent to the patient via MyChart unless otherwise noted below.    The patient was advised to call back or seek an in-person evaluation if the symptoms worsen or if the condition fails to improve as anticipated.  Time:  I spent 8 minutes with the patient via telehealth technology discussing the above problems/concerns.    Mar Daring, PA-C

## 2022-08-07 NOTE — Patient Instructions (Signed)
Margaret Cline, thank you for joining Margaret Loveless, PA-C for today's virtual visit.  While this provider is not your primary care provider (PCP), if your PCP is located in our provider database this encounter information will be shared with them immediately following your visit.   A [place] MyChart account gives you access to today's visit and all your visits, tests, and labs performed at [facility] " click here if you don't have a [facility] MyChart account or go to [email or web address]  Consent: (Patient) Margaret Cline provided verbal consent for this virtual visit at the beginning of the encounter.  Current Medications:  Current Outpatient Medications:    metroNIDAZOLE (FLAGYL) 500 MG tablet, Take 1 tablet (500 mg total) by mouth 2 (two) times daily for 7 days., Disp: 14 tablet, Rfl: 0   acetaminophen (TYLENOL) 500 MG tablet, Take 1,000 mg by mouth every 8 (eight) hours as needed for headache., Disp: , Rfl:    albuterol (PROAIR HFA) 108 (90 Base) MCG/ACT inhaler, Inhale 2 puffs into the lungs every 4 (four) hours as needed for wheezing or shortness of breath. For shortness of breath/wheezing, Disp: 18 g, Rfl: 1   albuterol (PROVENTIL) (2.5 MG/3ML) 0.083% nebulizer solution, Take 3 mLs (2.5 mg total) by nebulization every 4 (four) hours as needed for wheezing or shortness of breath., Disp: 30 vial, Rfl: 0   azithromycin (ZITHROMAX) 250 MG tablet, Take 500 mg once, then 250 mg for four days, Disp: 6 tablet, Rfl: 0   benzonatate (TESSALON PERLES) 100 MG capsule, Take 1 capsule (100 mg total) by mouth 3 (three) times daily as needed., Disp: 20 capsule, Rfl: 0   cyclobenzaprine (FLEXERIL) 5 MG tablet, Take 1 tablet (5 mg total) by mouth 3 (three) times daily as needed for muscle spasms., Disp: 60 tablet, Rfl: 0   lisinopril (ZESTRIL) 5 MG tablet, Take 5 mg by mouth daily., Disp: , Rfl:    loratadine (CLARITIN) 10 MG tablet, Take 1 tablet (10 mg total) by mouth daily.  (Patient taking differently: Take 10 mg by mouth daily as needed for allergies or rhinitis.), Disp: 20 tablet, Rfl: 0   meloxicam (MOBIC) 15 MG tablet, Take 1 tablet (15 mg total) by mouth daily., Disp: 30 tablet, Rfl: 0   ondansetron (ZOFRAN) 4 MG tablet, Take 1 tablet (4 mg total) by mouth every 8 (eight) hours as needed for nausea or vomiting., Disp: 20 tablet, Rfl: 0   predniSONE (STERAPRED UNI-PAK 21 TAB) 10 MG (21) TBPK tablet, Use as directed, Disp: 21 tablet, Rfl: 0   Medications ordered in this encounter:  Meds ordered this encounter  Medications   metroNIDAZOLE (FLAGYL) 500 MG tablet    Sig: Take 1 tablet (500 mg total) by mouth 2 (two) times daily for 7 days.    Dispense:  14 tablet    Refill:  0    Order Specific Question:   Supervising Provider    Answer:   Merrilee Jansky [id number]     *If you need refills on other medications prior to your next appointment, please contact your pharmacy*  Follow-Up: Call back or seek an in-person evaluation if the symptoms worsen or if the condition fails to improve as anticipated.  [facility] [phone]  Other Instructions Bacterial Vaginosis  Bacterial vaginosis is an infection that occurs when the normal balance of bacteria in the vagina changes. This change is caused by an overgrowth of certain bacteria in the vagina. Bacterial vaginosis is the most  common vaginal infection among females aged 65 to 26 years. This condition increases the risk of sexually transmitted infections (STIs). Treatment can help reduce this risk. Treatment is very important for pregnant women because this condition can cause babies to be born early (prematurely) or at a low birth weight. What are the causes? This condition is caused by an increase in harmful bacteria that are normally present in small amounts in the vagina. However, the exact reason this condition develops is not known. You cannot get bacterial vaginosis from toilet seats,  bedding, swimming pools, or contact with objects around you. What increases the risk? The following factors may make you more likely to develop this condition: Having a new sexual partner or multiple sexual partners, or having unprotected sex. Douching. Having an intrauterine device (IUD). Smoking. Abusing drugs and alcohol. This may lead to riskier sexual behavior. Taking certain antibiotic medicines. Being pregnant. What are the signs or symptoms? Some women with this condition have no symptoms. Symptoms may include: Margaret Cline or white vaginal discharge. The discharge can be watery or foamy. A fish-like odor with discharge, especially after sex or during menstruation. Itching in and around the vagina. Burning or pain with urination. How is this diagnosed? This condition is diagnosed based on: Your medical history. A physical exam of the vagina. Checking a sample of vaginal fluid for harmful bacteria or abnormal cells. How is this treated? This condition is treated with antibiotic medicines. These may be given as a pill, a vaginal cream, or a medicine that is put into the vagina (suppository). If the condition comes back after treatment, a second round of antibiotics may be needed. Follow these instructions at home: Medicines Take or apply over-the-counter and prescription medicines only as told by your health care provider. Take or apply your antibiotic medicine as told by your health care provider. Do not stop using the antibiotic even if you start to feel better. General instructions If you have a female sexual partner, tell her that you have a vaginal infection. She should follow up with her health care provider. If you have a female sexual partner, he does not need treatment. Avoid sexual activity until you finish treatment. Drink enough fluid to keep your urine pale yellow. Keep the area around your vagina and rectum clean. Wash the area daily with warm water. Wipe yourself from  front to back after using the toilet. If you are breastfeeding, talk to your health care provider about continuing breastfeeding during treatment. Keep all follow-up visits. This is important. How is this prevented? Self-care Do not douche. Wash the outside of your vagina with warm water only. Wear cotton or cotton-lined underwear. Avoid wearing tight pants and pantyhose, especially during the summer. Safe sex Use protection when having sex. This includes: Using condoms. Using dental dams. This is a thin layer of a material made of latex or polyurethane that protects the mouth during oral sex. Limit the number of sexual partners. To help prevent bacterial vaginosis, it is best to have sex with just one partner (monogamous relationship). Make sure you and your sexual partner are tested for STIs. Drugs and alcohol Do not use any products that contain nicotine or tobacco. These products include cigarettes, chewing tobacco, and vaping devices, such as e-cigarettes. If you need help quitting, ask your health care provider. Do not use drugs. Do not drink alcohol if: Your health care provider tells you not to do this. You are pregnant, may be pregnant, or are planning to become pregnant.  If you drink alcohol: Limit how much you have to 0-1 drink a day. Be aware of how much alcohol is in your drink. In the [place] one drink equals one 12 oz bottle of beer (355 mL), one 5 oz glass of wine (148 mL), or one 1 oz glass of hard liquor (44 mL). Where to find more information Centers for Disease Control and Prevention: FootballExhibition.com.br American Sexual Health Association (ASHA): www.ashastd.org U.S. Department of Health and Health and safety inspector, Office on Women's Health: http://hoffman.com/ Contact a health care provider if: Your symptoms do not improve, even after treatment. You have more discharge or pain when urinating. You have a fever or chills. You have pain in your abdomen or pelvis. You have pain  during sex. You have vaginal bleeding between menstrual periods. Summary Bacterial vaginosis is a vaginal infection that occurs when the normal balance of bacteria in the vagina changes. It results from an overgrowth of certain bacteria. This condition increases the risk of sexually transmitted infections (STIs). Getting treated can help reduce this risk. Treatment is very important for pregnant women because this condition can cause babies to be born early (prematurely) or at low birth weight. This condition is treated with antibiotic medicines. These may be given as a pill, a vaginal cream, or a medicine that is put into the vagina (suppository). This information is not intended to replace advice given to you by your health care provider. Make sure you discuss any questions you have with your health care provider. Document Revised: 03/31/2020 Document Reviewed: 03/31/2020 Elsevier Patient Education  2023 Elsevier Inc.    If you have been instructed to have an in-person evaluation today at a local Urgent Care facility, please use the link below. It will take you to a list of all of our available [facility], including address, phone number and hours of operation. Please do not delay care.  [facility]  If you or a family member do not have a primary care provider, use the link below to schedule a visit and establish care. When you choose a [facility] primary care physician or advanced practice provider, you gain a long-term partner in health. Find a Primary Care Provider  Learn more about [facility]'s in-office and virtual care options: [facility] Now

## 2022-11-17 ENCOUNTER — Telehealth: Payer: Medicaid Other | Admitting: Nurse Practitioner

## 2022-11-17 DIAGNOSIS — J020 Streptococcal pharyngitis: Secondary | ICD-10-CM | POA: Diagnosis not present

## 2022-11-17 MED ORDER — AMOXICILLIN 500 MG PO CAPS: 500.0000 mg | ORAL_CAPSULE | Freq: Two times a day (BID) | ORAL | 0 refills | Status: AC

## 2022-11-17 NOTE — Patient Instructions (Signed)
Gloriajean Dell, thank you for joining Gildardo Pounds, NP for today's virtual visit.  While this provider is not your primary care provider (PCP), if your PCP is located in our provider database this encounter information will be shared with them immediately following your visit.   [facility] account gives you access to today's visit and all your visits, tests, and labs performed at [facility] " click here if you don't have a [facility] account or go to [email or web address]  Consent: (Patient) Margaret Cline provided verbal consent for this virtual visit at the beginning of the encounter.  Current Medications:  Current Outpatient Medications:    amoxicillin (AMOXIL) 500 MG capsule, Take 1 capsule (500 mg total) by mouth 2 (two) times daily for 10 days., Disp: 20 capsule, Rfl: 0   acetaminophen (TYLENOL) 500 MG tablet, Take 1,000 mg by mouth every 8 (eight) hours as needed for headache., Disp: , Rfl:    albuterol (PROAIR HFA) 108 (90 Base) MCG/ACT inhaler, Inhale 2 puffs into the lungs every 4 (four) hours as needed for wheezing or shortness of breath. For shortness of breath/wheezing, Disp: 18 g, Rfl: 1   albuterol (PROVENTIL) (2.5 MG/3ML) 0.083% nebulizer solution, Take 3 mLs (2.5 mg total) by nebulization every 4 (four) hours as needed for wheezing or shortness of breath., Disp: 30 vial, Rfl: 0   azithromycin (ZITHROMAX) 250 MG tablet, Take 500 mg once, then 250 mg for four days, Disp: 6 tablet, Rfl: 0   benzonatate (TESSALON PERLES) 100 MG capsule, Take 1 capsule (100 mg total) by mouth 3 (three) times daily as needed., Disp: 20 capsule, Rfl: 0   cyclobenzaprine (FLEXERIL) 5 MG tablet, Take 1 tablet (5 mg total) by mouth 3 (three) times daily as needed for muscle spasms., Disp: 60 tablet, Rfl: 0   lisinopril (ZESTRIL) 5 MG tablet, Take 5 mg by mouth daily., Disp: , Rfl:    loratadine (CLARITIN) 10 MG tablet, Take 1 tablet (10 mg total) by mouth daily. (Patient  taking differently: Take 10 mg by mouth daily as needed for allergies or rhinitis.), Disp: 20 tablet, Rfl: 0   meloxicam (MOBIC) 15 MG tablet, Take 1 tablet (15 mg total) by mouth daily., Disp: 30 tablet, Rfl: 0   ondansetron (ZOFRAN) 4 MG tablet, Take 1 tablet (4 mg total) by mouth every 8 (eight) hours as needed for nausea or vomiting., Disp: 20 tablet, Rfl: 0   predniSONE (STERAPRED UNI-PAK 21 TAB) 10 MG (21) TBPK tablet, Use as directed, Disp: 21 tablet, Rfl: 0   Medications ordered in this encounter:  Meds ordered this encounter  Medications   amoxicillin (AMOXIL) 500 MG capsule    Sig: Take 1 capsule (500 mg total) by mouth 2 (two) times daily for 10 days.    Dispense:  20 capsule    Refill:  0    Order Specific Question:   Supervising Provider    Answer:   Chase Picket [id number]     *If you need refills on other medications prior to your next appointment, please contact your pharmacy*  Follow-Up: Call back or seek an in-person evaluation if the symptoms worsen or if the condition fails to improve as anticipated.  [facility] [phone]  Other Instructions May alternate with liquid Tylenol or Motrin for sore throat pain relief Warm salt water gargles and/or warm tea with honey may help soothe the throat pain   If you have been instructed to have an in-person  evaluation today at a local Urgent Care facility, please use the link below. It will take you to a list of all of our available [facility], including address, phone number and hours of operation. Please do not delay care.  [facility]  If you or a family member do not have a primary care provider, use the link below to schedule a visit and establish care. When you choose a [facility] primary care physician or advanced practice provider, you gain a long-term partner in health. Find a Primary Care Provider  Learn more about [facility]'s in-office and virtual care  options: [facility]

## 2022-11-17 NOTE — Progress Notes (Signed)
Virtual Visit Consent   Margaret Cline, you are scheduled for a virtual visit with a [facility] provider today. Just as with appointments in the office, your consent must be obtained to participate. Your consent will be active for this visit and any virtual visit you may have with one of our providers in the next 365 days. If you have a MyChart account, a copy of this consent can be sent to you electronically.  As this is a virtual visit, video technology does not allow for your provider to perform a traditional examination. This may limit your provider's ability to fully assess your condition. If your provider identifies any concerns that need to be evaluated in person or the need to arrange testing (such as labs, EKG, etc.), we will make arrangements to do so. Although advances in technology are sophisticated, we cannot ensure that it will always work on either your end or our end. If the connection with a video visit is poor, the visit may have to be switched to a telephone visit. With either a video or telephone visit, we are not always able to ensure that we have a secure connection.  By engaging in this virtual visit, you consent to the provision of healthcare and authorize for your insurance to be billed (if applicable) for the services provided during this visit. Depending on your insurance coverage, you may receive a charge related to this service.  I need to obtain your verbal consent now. Are you willing to proceed with your visit today? Despina Cline has provided verbal consent on 11/17/2022 for a virtual visit (video or telephone). Gildardo Pounds, NP  Date: 11/17/2022 9:44 AM  Virtual Visit via Video Note   I, Gildardo Pounds, connected with  [name]  ([id number], 01-22-1991) on 11/17/22 at 11:30 AM EST by a video-enabled telemedicine application and verified that I am speaking with the correct person using two identifiers.  Location: Patient: Virtual Visit Location Patient:  Home Provider: Virtual Visit Location Provider: Home Office   I discussed the limitations of evaluation and management by telemedicine and the availability of in person appointments. The patient expressed understanding and agreed to proceed.    History of Present Illness: Margaret Cline is a 32 y.o. who identifies as a female who was assigned female at birth, and is being seen today for strep pharyngitis.  Ms. Shek states her daughter was diagnosed with strep throat last week and now she herself is currently experiencing severe sore throat pain, cough, vocal hoarseness, left-sided ear pain and left-sided cervical lymphadenopathy.  Symptoms seem to be progressing over the past 24 hours.    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   Cigarette nicotine dependence without complication 08/65/7846   BMI 33.0-33.9,adult 02/23/2015   Major depression, recurrent ([place]) 08/23/2013   Suicide attempt by multiple drug overdose ([place]) 08/23/2013   Bacterial vaginosis 05/08/2012   Asthma 03/20/2012   Obesity 03/07/2012    Allergies:  Allergies  Allergen Reactions   Prednisone Nausea And Vomiting   Medications:  Current Outpatient Medications:    amoxicillin (AMOXIL) 500 MG capsule, Take 1 capsule (500 mg total) by mouth 2 (two) times daily for 10 days., Disp: 20 capsule, Rfl: 0   acetaminophen (TYLENOL) 500 MG tablet, Take 1,000 mg by mouth every 8 (eight) hours as needed  for headache., Disp: , Rfl:    albuterol (PROAIR HFA) 108 (90 Base) MCG/ACT inhaler, Inhale 2 puffs into the lungs every 4 (four) hours as needed for wheezing or shortness of breath. For shortness of breath/wheezing, Disp: 18 g, Rfl: 1   albuterol (PROVENTIL) (2.5 MG/3ML) 0.083% nebulizer solution, Take 3 mLs (2.5 mg total) by nebulization every 4 (four) hours as needed  for wheezing or shortness of breath., Disp: 30 vial, Rfl: 0   azithromycin (ZITHROMAX) 250 MG tablet, Take 500 mg once, then 250 mg for four days, Disp: 6 tablet, Rfl: 0   benzonatate (TESSALON PERLES) 100 MG capsule, Take 1 capsule (100 mg total) by mouth 3 (three) times daily as needed., Disp: 20 capsule, Rfl: 0   cyclobenzaprine (FLEXERIL) 5 MG tablet, Take 1 tablet (5 mg total) by mouth 3 (three) times daily as needed for muscle spasms., Disp: 60 tablet, Rfl: 0   lisinopril (ZESTRIL) 5 MG tablet, Take 5 mg by mouth daily., Disp: , Rfl:    loratadine (CLARITIN) 10 MG tablet, Take 1 tablet (10 mg total) by mouth daily. (Patient taking differently: Take 10 mg by mouth daily as needed for allergies or rhinitis.), Disp: 20 tablet, Rfl: 0   meloxicam (MOBIC) 15 MG tablet, Take 1 tablet (15 mg total) by mouth daily., Disp: 30 tablet, Rfl: 0   ondansetron (ZOFRAN) 4 MG tablet, Take 1 tablet (4 mg total) by mouth every 8 (eight) hours as needed for nausea or vomiting., Disp: 20 tablet, Rfl: 0   predniSONE (STERAPRED UNI-PAK 21 TAB) 10 MG (21) TBPK tablet, Use as directed, Disp: 21 tablet, Rfl: 0  Observations/Objective: Patient is well-developed, well-nourished in no acute distress.  Resting comfortably  at home.  Head is normocephalic, atraumatic.  No labored breathing.  Speech is clear and coherent with logical content.  There is vocal hoarseness present Patient is alert and oriented at baseline.    Assessment and Plan: 1. Strep pharyngitis - amoxicillin (AMOXIL) 500 MG capsule; Take 1 capsule (500 mg total) by mouth 2 (two) times daily for 10 days.  Dispense: 20 capsule; Refill: 0 May alternate with liquid Tylenol or Motrin for sore throat pain relief Warm salt water gargles and/or warm tea with honey may help soothe the throat pain  Follow Up Instructions: I discussed the assessment and treatment plan with the patient. The patient was provided an opportunity to ask questions and all were  answered. The patient agreed with the plan and demonstrated an understanding of the instructions.  A copy of instructions were sent to the patient via MyChart unless otherwise noted below.   The patient was advised to call back or seek an in-person evaluation if the symptoms worsen or if the condition fails to improve as anticipated.  Time:  I spent 11 minutes with the patient via telehealth technology discussing the above problems/concerns.    Gildardo Pounds, NP

## 2022-11-18 ENCOUNTER — Encounter (HOSPITAL_BASED_OUTPATIENT_CLINIC_OR_DEPARTMENT_OTHER): Payer: Self-pay | Admitting: Emergency Medicine

## 2022-11-18 ENCOUNTER — Other Ambulatory Visit: Payer: Self-pay

## 2022-11-18 ENCOUNTER — Emergency Department (HOSPITAL_BASED_OUTPATIENT_CLINIC_OR_DEPARTMENT_OTHER)
Admission: EM | Admit: 2022-11-18 | Discharge: 2022-11-18 | Disposition: A | Discharge status: Home / Self Care | Payer: Medicaid Other | Attending: Emergency Medicine | Admitting: Emergency Medicine

## 2022-11-18 DIAGNOSIS — N764 Abscess of vulva: Secondary | ICD-10-CM | POA: Diagnosis present

## 2022-11-18 DIAGNOSIS — L0291 Cutaneous abscess, unspecified: Secondary | ICD-10-CM

## 2022-11-18 DIAGNOSIS — J101 Influenza due to other identified influenza virus with other respiratory manifestations: Secondary | ICD-10-CM | POA: Diagnosis not present

## 2022-11-18 DIAGNOSIS — L732 Hidradenitis suppurativa: Secondary | ICD-10-CM | POA: Insufficient documentation

## 2022-11-18 DIAGNOSIS — Z1152 Encounter for screening for COVID-19: Secondary | ICD-10-CM | POA: Insufficient documentation

## 2022-11-18 LAB — RESP PANEL BY RT-PCR (RSV, FLU A&B, COVID)  RVPGX2
Influenza A by PCR: NEGATIVE
Influenza B by PCR: POSITIVE — AB
Resp Syncytial Virus by PCR: NEGATIVE
SARS Coronavirus 2 by RT PCR: NEGATIVE

## 2022-11-18 MED ORDER — ACETAMINOPHEN 500 MG PO TABS
1000.0000 mg | ORAL_TABLET | Freq: Once | ORAL | Status: AC
  Administered 2022-11-18: 1000 mg via ORAL
  Filled 2022-11-18: qty 2

## 2022-11-18 MED ORDER — KETOROLAC TROMETHAMINE 15 MG/ML IJ SOLN
15.0000 mg | Freq: Once | INTRAMUSCULAR | Status: AC
  Administered 2022-11-18: 15 mg via INTRAMUSCULAR
  Filled 2022-11-18: qty 1

## 2022-11-18 MED ORDER — CLINDAMYCIN HCL 300 MG PO CAPS: 300.0000 mg | ORAL_CAPSULE | Freq: Three times a day (TID) | ORAL | 0 refills | Status: AC

## 2022-11-18 NOTE — ED Triage Notes (Signed)
Pt c/o abscess on fold between thigh and vagina; sts she started abx for strep throat yesterday, but is not feeling any better

## 2022-11-18 NOTE — ED Provider Notes (Signed)
[place] [facility] Provider Note   CSN: [id number] Arrival date & time: 11/18/22  2119     History Chief Complaint  Patient presents with   Abscess    HPI Margaret Cline is a 32 y.o. female presenting for chief complaint of abscess.  She is endorsing a labial abscess that popped up 2 days ago.  She was on amoxicillin for sore throat. She denies fevers or chills, nausea vomiting syncope shortness of breath.  She is otherwise ambulatory tolerating p.o. intake.  Endorses that her daughter was recently ill with a streptococcal throat infection and that is why she is on amoxicillin.  She is worried about taking her Bactrim which she has prescribed for hidradenitis in the setting of taking this amoxicillin..  Patient's recorded medical, surgical, social, medication list and allergies were reviewed in the Snapshot window as part of the initial history.   Review of Systems   Review of Systems  Constitutional:  Negative for chills and fever.  HENT:  Positive for sore throat. Negative for ear pain.   Eyes:  Negative for pain and visual disturbance.  Respiratory:  Negative for cough and shortness of breath.   Cardiovascular:  Negative for chest pain and palpitations.  Gastrointestinal:  Negative for abdominal pain and vomiting.  Genitourinary:  Negative for dysuria and hematuria.  Musculoskeletal:  Negative for arthralgias and back pain.  Skin:  Negative for color change and rash.  Neurological:  Negative for seizures and syncope.  All other systems reviewed and are negative.   Physical Exam Updated Vital Signs BP 105/72   Pulse 85   Temp 98.2 F (36.8 C) (Oral)   Resp 18   Ht 5\' 8"  (1.727 m)   Wt 90.7 kg   SpO2 100%   BMI 30.41 kg/m  Physical Exam Vitals and nursing note reviewed.  Constitutional:      General: She is not in acute distress.    Appearance: She is well-developed.  HENT:     Head: Normocephalic and atraumatic.  Eyes:      Conjunctiva/sclera: Conjunctivae normal.  Cardiovascular:     Rate and Rhythm: Normal rate and regular rhythm.     Heart sounds: No murmur heard. Pulmonary:     Effort: Pulmonary effort is normal. No respiratory distress.     Breath sounds: Normal breath sounds.  Abdominal:     General: There is no distension.     Palpations: Abdomen is soft.     Tenderness: There is no abdominal tenderness. There is no right CVA tenderness or left CVA tenderness.  Musculoskeletal:        General: No swelling or tenderness. Normal range of motion.     Cervical back: Neck supple.  Skin:    General: Skin is warm and dry.  Neurological:     General: No focal deficit present.     Mental Status: She is alert and oriented to person, place, and time. Mental status is at baseline.     Cranial Nerves: No cranial nerve deficit.      ED Course/ Medical Decision Making/ A&P    Procedures Procedures   Medications Ordered in ED Medications  acetaminophen (TYLENOL) tablet 1,000 mg (has no administration in time range)  ketorolac (TORADOL) 15 MG/ML injection 15 mg (has no administration in time range)    Medical Decision Making:    Margaret Cline is a 32 y.o. female who presented to the ED today with concern for multiple abscesses  detailed above.     Patient's presentation is complicated by their history of multiple comorbid medical problems.  Patient placed on continuous vitals and telemetry monitoring while in ED which was reviewed periodically.   Complete initial physical exam performed, notably the patient  was hemodynamically stable in no acute distress.  A nurse was in the room to perform a external vaginal exam.  She has a labial abscess on the right side.  It is approximately 0.5 cm in diameter..      Reviewed and confirmed nursing documentation for past medical history, family history, social history.    Initial Assessment:   This is a patient with a history of hidradenitis presenting with  recurrent abscess.  Patient had multiple incisions and drainages in this area with recurrent abscess.  Patient likely requires referral to a subspecialist provider for further outpatient care and management for her hidradenitis. I am worried that repeat incision and drainage is more likely to complicate her care and unlikely provide substantial relief.  I have informed her on supportive care including antibiotic soap, daily showers, avoiding shaving, warm compresses, NSAIDs, tylenol and when to use the Bactrim prescription that her PCP gave her. Currently, she has comorbid strep and a hidradenitis flare, she would be best treated with a clindamycin prescription to cover both of these today.  Told her to stop the amoxicillin, not take the Bactrim and to utilize the clindamycin for the next 5 days before following up with PCP/dermatology.  She wanted to be tested for COVID and flu for work which is reasonablegiven her URI symptoms.Patient will follow up these results asynchronously.  Patient denied concern for pregnancy.  Disposition:  I have considered need for hospitalization, however, considering all of the above, I believe this patient is stable for discharge at this time.  Patient/family educated about specific return precautions for given chief complaint and symptoms.  Patient/family educated about follow-up with PCP.     Patient/family expressed understanding of return precautions and need for follow-up. Patient spoken to regarding all imaging and laboratory results and appropriate follow up for these results. All education provided in verbal form with additional information in written form. Time was allowed for answering of patient questions. Patient discharged.    Emergency Department Medication Summary:   Medications  acetaminophen (TYLENOL) tablet 1,000 mg (has no administration in time range)  ketorolac (TORADOL) 15 MG/ML injection 15 mg (has no administration in time range)         Clinical Impression:  1. Abscess   2. Hydradenitis      Discharge   Final Clinical Impression(s) / ED Diagnoses Final diagnoses:  Abscess  Hydradenitis    Rx / DC Orders ED Discharge Orders          Ordered    clindamycin (CLEOCIN) 300 MG capsule  3 times daily        11/18/22 2154              Tretha Sciara, MD 11/18/22 2155

## 2022-11-21 ENCOUNTER — Other Ambulatory Visit: Payer: Self-pay

## 2022-11-21 ENCOUNTER — Encounter (HOSPITAL_BASED_OUTPATIENT_CLINIC_OR_DEPARTMENT_OTHER): Payer: Self-pay | Admitting: Pediatrics

## 2022-11-21 ENCOUNTER — Emergency Department (HOSPITAL_BASED_OUTPATIENT_CLINIC_OR_DEPARTMENT_OTHER)
Admission: EM | Admit: 2022-11-21 | Discharge: 2022-11-21 | Disposition: A | Discharge status: Home / Self Care | Payer: Medicaid Other | Attending: Emergency Medicine | Admitting: Emergency Medicine

## 2022-11-21 ENCOUNTER — Emergency Department (HOSPITAL_BASED_OUTPATIENT_CLINIC_OR_DEPARTMENT_OTHER): Payer: Medicaid Other

## 2022-11-21 DIAGNOSIS — R519 Headache, unspecified: Secondary | ICD-10-CM | POA: Insufficient documentation

## 2022-11-21 DIAGNOSIS — R55 Syncope and collapse: Secondary | ICD-10-CM | POA: Diagnosis not present

## 2022-11-21 LAB — BASIC METABOLIC PANEL
Anion gap: 5 (ref 5–15)
BUN: 7 mg/dL (ref 6–20)
CO2: 26 mmol/L (ref 22–32)
Calcium: 8.3 mg/dL — ABNORMAL LOW (ref 8.9–10.3)
Chloride: 101 mmol/L (ref 98–111)
Creatinine, Ser: 0.79 mg/dL (ref 0.44–1.00)
GFR, Estimated: >60 mL/min (ref >60)
Glucose, Bld: 82 mg/dL (ref 70–99)
Potassium: 3.7 mmol/L (ref 3.5–5.1)
Sodium: 132 mmol/L — ABNORMAL LOW (ref 135–145)

## 2022-11-21 LAB — URINALYSIS, ROUTINE W REFLEX MICROSCOPIC
Bilirubin Urine: NEGATIVE
Glucose, UA: NEGATIVE mg/dL
Ketones, ur: NEGATIVE mg/dL
Leukocytes,Ua: NEGATIVE
Nitrite: NEGATIVE
Protein, ur: NEGATIVE mg/dL
Specific Gravity, Urine: 1.02 (ref 1.005–1.030)
pH: 6 (ref 5.0–8.0)

## 2022-11-21 LAB — CBC WITH DIFFERENTIAL/PLATELET
Abs Immature Granulocytes: 0.02 10*3/uL (ref 0.00–0.07)
Basophils Absolute: 0 10*3/uL (ref 0.0–0.1)
Basophils Relative: 0 %
Eosinophils Absolute: 0.1 10*3/uL (ref 0.0–0.5)
Eosinophils Relative: 1 %
HCT: 39.8 % (ref 36.0–46.0)
Hemoglobin: 13.2 g/dL (ref 12.0–15.0)
Immature Granulocytes: 0 %
Lymphocytes Relative: 38 %
Lymphs Abs: 2.2 10*3/uL (ref 0.7–4.0)
MCH: 30.5 pg (ref 26.0–34.0)
MCHC: 33.2 g/dL (ref 30.0–36.0)
MCV: 91.9 fL (ref 80.0–100.0)
Monocytes Absolute: 0.5 10*3/uL (ref 0.1–1.0)
Monocytes Relative: 9 %
Neutro Abs: 3 10*3/uL (ref 1.7–7.7)
Neutrophils Relative %: 52 %
Platelets: 207 10*3/uL (ref 150–400)
RBC: 4.33 MIL/uL (ref 3.87–5.11)
RDW: 13.7 % (ref 11.5–15.5)
WBC: 5.8 10*3/uL (ref 4.0–10.5)
nRBC: 0 % (ref 0.0–0.2)

## 2022-11-21 LAB — URINALYSIS, MICROSCOPIC (REFLEX)

## 2022-11-21 LAB — PREGNANCY, URINE: Preg Test, Ur: NEGATIVE

## 2022-11-21 MED ORDER — SODIUM CHLORIDE 0.9 % IV BOLUS
1000.0000 mL | Freq: Once | INTRAVENOUS | Status: AC
  Administered 2022-11-21: 1000 mL via INTRAVENOUS

## 2022-11-21 MED ORDER — KETOROLAC TROMETHAMINE 15 MG/ML IJ SOLN
15.0000 mg | Freq: Once | INTRAMUSCULAR | Status: AC
  Administered 2022-11-21: 15 mg via INTRAVENOUS
  Filled 2022-11-21: qty 1

## 2022-11-21 NOTE — Discharge Instructions (Addendum)
Drink plenty of fluids, follow-up with your primary this week for reevaluation.  Return to the ED if you have any new or concerning symptoms, chest pain, another episode of loss of consciousness.

## 2022-11-21 NOTE — ED Notes (Signed)
ED Provider at bedside. 

## 2022-11-21 NOTE — ED Notes (Addendum)
Patient in lobby, visibly upset, yelling at staff stating everybody else has been taken to the back except her. Reports that she has a headache and she hit her head when she passed out.

## 2022-11-21 NOTE — ED Provider Notes (Signed)
[place] [facility] Provider Note   CSN: [id number] Arrival date & time: 11/21/22  1056     History  Chief Complaint  Patient presents with   Headache    Margaret Cline is a 32 y.o. female.   Headache    Patient presents to the emergency department due to headache and syncopal event.  Patient states she has been having headaches and mainly for the last week or so.  She was diagnosed with the flu last week, she is tried over-the-counter medicine with no relief.  She is having any vision changes, lateralized weakness or numbness.  It waxes and wanes throughout the day.  Worse with light, no nausea or vomiting.  States she has had decreased appetite over the last few days secondary to the flu and not eating and drinking as much.  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  Was not preceded by headache, chest pain, shortness of breath or seizure.  She states she was walking, felt warm and lost consciousness for a few seconds.  SHe did hit her head, no neck pain.  Not on blood thinners.  Home Medications Prior to Admission medications   Medication Sig Start Date End Date Taking? Authorizing Provider  acetaminophen (TYLENOL) 500 MG tablet Take 1,000 mg by mouth every 8 (eight) hours as needed for headache.    [provider]  albuterol (PROAIR HFA) 108 (90 Base) MCG/ACT inhaler Inhale 2 puffs into the lungs every 4 (four) hours as needed for wheezing or shortness of breath. For shortness of breath/wheezing 05/12/19   Myles Gip, DO  albuterol (PROVENTIL) (2.5 MG/3ML) 0.083% nebulizer solution Take 3 mLs (2.5 mg total) by nebulization every 4 (four) hours as needed for wheezing or shortness of breath. 09/06/15   Street, [place], PA-C  amoxicillin (AMOXIL) 500 MG capsule Take 1 capsule (500 mg total) by mouth 2 (two) times daily for 10 days. 11/17/22 11/27/22  Gildardo Pounds, NP  azithromycin (ZITHROMAX) 250  MG tablet Take 500 mg once, then 250 mg for four days 07/01/22   Evelina Dun A, FNP  benzonatate (TESSALON PERLES) 100 MG capsule Take 1 capsule (100 mg total) by mouth 3 (three) times daily as needed. 07/01/22   Sharion Balloon, FNP  clindamycin (CLEOCIN) 300 MG capsule Take 1 capsule (300 mg total) by mouth 3 (three) times daily for 7 days. 11/18/22 11/25/22  Tretha Sciara, MD  cyclobenzaprine (FLEXERIL) 5 MG tablet Take 1 tablet (5 mg total) by mouth 3 (three) times daily as needed for muscle spasms. 05/03/19   Jaynee Eagles, PA-C  lisinopril (ZESTRIL) 5 MG tablet Take 5 mg by mouth daily.    [provider]  loratadine (CLARITIN) 10 MG tablet Take 1 tablet (10 mg total) by mouth daily. Patient taking differently: Take 10 mg by mouth daily as needed for allergies or rhinitis. 05/29/17   Glyn Ade, PA-C  meloxicam (MOBIC) 15 MG tablet Take 1 tablet (15 mg total) by mouth daily. 05/03/19   Jaynee Eagles, PA-C  ondansetron (ZOFRAN) 4 MG tablet Take 1 tablet (4 mg total) by mouth every 8 (eight) hours as needed for nausea or vomiting. 07/01/22   Sharion Balloon, FNP  predniSONE (STERAPRED UNI-PAK 21 TAB) 10 MG (21) TBPK tablet Use as directed 07/01/22   Sharion Balloon, FNP      Allergies    Prednisone    Review of Systems  Review of Systems  Neurological:  Positive for headaches.    Physical Exam Updated Vital Signs BP 113/79   Pulse 70   Temp 98.6 F (37 C) (Oral)   Resp 16   Ht 5\' 8"  (1.727 m)   Wt 90.7 kg   SpO2 100%   BMI 30.41 kg/m  Physical Exam Vitals and nursing note reviewed. Exam conducted with a chaperone present.  Constitutional:      Appearance: Normal appearance.  HENT:     Head: Normocephalic and atraumatic.  Eyes:     General: No scleral icterus.       Right eye: No discharge.        Left eye: No discharge.     Extraocular Movements: Extraocular movements intact.     Pupils: Pupils are equal, round, and reactive to light.  Cardiovascular:      Rate and Rhythm: Normal rate and regular rhythm.     Pulses: Normal pulses.     Heart sounds: Normal heart sounds. No murmur heard.    No friction rub. No gallop.  Pulmonary:     Effort: Pulmonary effort is normal. No respiratory distress.     Breath sounds: Normal breath sounds.  Abdominal:     General: Abdomen is flat. Bowel sounds are normal. There is no distension.     Palpations: Abdomen is soft.     Tenderness: There is no abdominal tenderness.  Skin:    General: Skin is warm and dry.     Coloration: Skin is not jaundiced.  Neurological:     Mental Status: She is alert. Mental status is at baseline.     GCS: GCS eye subscore is 4. GCS verbal subscore is 5. GCS motor subscore is 6.     Cranial Nerves: No cranial nerve deficit.     Coordination: Coordination normal.     Comments: Cranial nerves II to XII gross intact upper and lower extremities today symmetric bilaterally.  Ambulatory with a steady gait.     ED Results / Procedures / Treatments   Labs (all labs ordered are listed, but only abnormal results are displayed) Labs Reviewed  BASIC METABOLIC PANEL - Abnormal; Notable for the following components:      Result Value   Sodium 132 (*)    Calcium 8.3 (*)    All other components within normal limits  URINALYSIS, ROUTINE W REFLEX MICROSCOPIC - Abnormal; Notable for the following components:   Hgb urine dipstick TRACE (*)    All other components within normal limits  URINALYSIS, MICROSCOPIC (REFLEX) - Abnormal; Notable for the following components:   Bacteria, UA RARE (*)    All other components within normal limits  CBC WITH DIFFERENTIAL/PLATELET  PREGNANCY, URINE    EKG None  Radiology CT Head Wo Contrast  Result Date: 11/21/2022 CLINICAL DATA:  Head trauma, minor, normal mental status (Age 45-64y) EXAM: CT HEAD WITHOUT CONTRAST TECHNIQUE: Contiguous axial images were obtained from the base of the skull through the vertex without intravenous contrast. RADIATION  DOSE REDUCTION: This exam was performed according to the departmental dose-optimization program which includes automated exposure control, adjustment of the mA and/or kV according to patient size and/or use of iterative reconstruction technique. COMPARISON:  09/17/2016 FINDINGS: Brain: No evidence of acute infarction, hemorrhage, hydrocephalus, extra-axial collection or mass lesion/mass effect. Vascular: No hyperdense vessel or unexpected calcification. Skull: Normal. Negative for fracture or focal lesion. Sinuses/Orbits: Mucosal thickening within the bilateral ethmoid air cells and right sphenoid sinus. No air-fluid  levels. Other: None. IMPRESSION: 1. No acute intracranial abnormality. 2. Mild paranasal sinus disease. Electronically Signed   By: Davina Poke D.O.   On: 11/21/2022 12:41    Procedures Procedures    Medications Ordered in ED Medications  sodium chloride 0.9 % bolus 1,000 mL (1,000 mLs Intravenous New Bag/Given 11/21/22 1327)  ketorolac (TORADOL) 15 MG/ML injection 15 mg (15 mg Intravenous Given 11/21/22 1328)    ED Course/ Medical Decision Making/ A&P Clinical Course as of 11/21/22 1424  Wed Nov 21, 2022  1353 CBC with Differential No leukocytosis or anemia [HS]  1353 Pregnancy, urine Not pregnant, not an ectopic [HS]  1353 Urinalysis, Routine w reflex microscopic -Urine, Clean Catch(!) Trace hematuria but no obvious UTI [HS]  1413 ED EKG Sinus rhythm, no underlying arrhythmia.  On cardiac monitoring patient is in sinus rhythm. [HS]  1324 CT Head Wo Contrast Longstanding sinus disease but no acute intracranial process.  Agree with radiologist. [HS]  4010 Basic metabolic panel(!) No gross electrolyte derangement, AKI or transaminitis [HS]    Clinical Course User Index [HS] Sherrill Raring, PA-C                             Medical Decision Making Amount and/or Complexity of Data Reviewed Labs: ordered. Decision-making details documented in ED Course. Radiology: ordered.  Decision-making details documented in ED Course. ECG/medicine tests:  Decision-making details documented in ED Course.  Risk Prescription drug management.   This is a 32 year old female presenting to the emergency department due to headache and syncopal event.  Differential includes but not limited to arrhythmia, PE, SAH, dehydration, vasovagal, electrolyte derangement, metabolic abnormality, symptomatic anemia, medication reaction.  On exam patient is very well-appearing, no appreciable focal deficits on neuroexam she is amatory with steady gait.  Upper and lower extremity pulses are symmetric, regular rate and rhythm.  Clinically, patient is not septic.   Will proceed with labs, fluids and Toradol.  Also check EKG but patient on cardiac monitoring.  CT head ordered in triage reviewed by myself and negative for any acute process although given the underlying sinusitis.  She is been on clindamycin and Augmentin recently, nothing any additional antibiotics would be indicated at this time.    Laboratory workup is very reassuring, no signs symptomatic anemia, electrolyte derangement or kidney injury.  No signs of symptomatic anemia, underlying infection.  EKG shows sinus rhythm without any underlying arrhythmia, Brugada or QT prolongation.  Discussed workup with the patient, she is feeling improved after fluids and Toradol. I do not think headache and syncopal event related based on the history, I did discuss she may have had a vasovagal given poor oral intake and encouraged to drink plenty of fluids in the outpatient setting.  I do not think any additional imaging or workup indicated at this time, discussed with her return precautions outpatient follow-up plan.  Stable for discharge.        Final Clinical Impression(s) / ED Diagnoses Final diagnoses:  None    Rx / DC Orders ED Discharge Orders     None         Sherrill Raring, [place] 11/21/22 1424    Wyvonnia Dusky, MD 11/21/22  1500

## 2022-11-21 NOTE — ED Triage Notes (Signed)
Reports syncope, headache and "disoriented". Stated she was diagnose with flu last week, and does not feel like she's feeling better.

## 2022-11-21 NOTE — ED Notes (Signed)
Pt verbalized understanding of discharge instructions. Opportunity for questions provided.  

## 2022-11-24 ENCOUNTER — Encounter: Payer: Self-pay | Admitting: Nurse Practitioner

## 2023-03-31 ENCOUNTER — Telehealth: Payer: Medicaid Other | Admitting: Family Medicine

## 2023-03-31 DIAGNOSIS — B9689 Other specified bacterial agents as the cause of diseases classified elsewhere: Secondary | ICD-10-CM

## 2023-03-31 DIAGNOSIS — J019 Acute sinusitis, unspecified: Secondary | ICD-10-CM

## 2023-03-31 MED ORDER — AMOXICILLIN-POT CLAVULANATE 875-125 MG PO TABS: 1.0000 | ORAL_TABLET | Freq: Two times a day (BID) | ORAL | 0 refills | Status: DC

## 2023-03-31 NOTE — Patient Instructions (Signed)

## 2023-03-31 NOTE — Progress Notes (Signed)
Virtual Visit Consent   Margaret Cline, you are scheduled for a virtual visit with a [facility] provider today. Just as with appointments in the office, your consent must be obtained to participate. Your consent will be active for this visit and any virtual visit you may have with one of our providers in the next 365 days. If you have a MyChart account, a copy of this consent can be sent to you electronically.  As this is a virtual visit, video technology does not allow for your provider to perform a traditional examination. This may limit your provider's ability to fully assess your condition. If your provider identifies any concerns that need to be evaluated in person or the need to arrange testing (such as labs, EKG, etc.), we will make arrangements to do so. Although advances in technology are sophisticated, we cannot ensure that it will always work on either your end or our end. If the connection with a video visit is poor, the visit may have to be switched to a telephone visit. With either a video or telephone visit, we are not always able to ensure that we have a secure connection.  By engaging in this virtual visit, you consent to the provision of healthcare and authorize for your insurance to be billed (if applicable) for the services provided during this visit. Depending on your insurance coverage, you may receive a charge related to this service.  I need to obtain your verbal consent now. Are you willing to proceed with your visit today? Aleighya Engebretsen has provided verbal consent on 03/31/2023 for a virtual visit (video or telephone). Georgana Curio, FNP  Date: 03/31/2023 11:34 AM  Virtual Visit via Video Note   I, Georgana Curio, connected with  Margaret Cline  ([id number], 1990-12-15) on 03/31/23 at 10:00 AM EDT by a video-enabled telemedicine application and verified that I am speaking with the correct person using two identifiers.  Location: Patient: Virtual Visit Location Patient: Home Provider:  Virtual Visit Location Provider: Home Office   I discussed the limitations of evaluation and management by telemedicine and the availability of in person appointments. The patient expressed understanding and agreed to proceed.    History of Present Illness: Margaret Cline is a 32 y.o. who identifies as a female who was assigned female at birth, and is being seen today for sinus pain and pressure for almost a week worsening. Post nasal drainage. No fever or chills. No cough. .  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   Cigarette nicotine dependence without complication 02/23/2015   BMI 33.0-33.9,adult 02/23/2015   Major depression, recurrent (HCC) 08/23/2013   Suicide attempt by multiple drug overdose (HCC) 08/23/2013   Bacterial vaginosis 05/08/2012   Asthma 03/20/2012   Obesity 03/07/2012    Allergies:  Allergies  Allergen Reactions   Prednisone Nausea And Vomiting   Medications:  Current Outpatient Medications:    amoxicillin-clavulanate (AUGMENTIN) 875-125 MG tablet, Take 1 tablet by mouth 2 (two) times daily., Disp: 20 tablet, Rfl: 0   acetaminophen (TYLENOL) 500 MG tablet, Take 1,000 mg by mouth every 8 (eight) hours as needed for headache., Disp: , Rfl:    albuterol (PROAIR HFA) 108 (90 Base) MCG/ACT inhaler, Inhale 2 puffs into the lungs every 4 (four) hours as needed for wheezing or shortness of breath. For  shortness of breath/wheezing, Disp: 18 g, Rfl: 1   albuterol (PROVENTIL) (2.5 MG/3ML) 0.083% nebulizer solution, Take 3 mLs (2.5 mg total) by nebulization every 4 (four) hours as needed for wheezing or shortness of breath., Disp: 30 vial, Rfl: 0   azithromycin (ZITHROMAX) 250 MG tablet, Take 500 mg once, then 250 mg for four days, Disp: 6 tablet, Rfl: 0   benzonatate (TESSALON PERLES) 100 MG capsule,  Take 1 capsule (100 mg total) by mouth 3 (three) times daily as needed., Disp: 20 capsule, Rfl: 0   cyclobenzaprine (FLEXERIL) 5 MG tablet, Take 1 tablet (5 mg total) by mouth 3 (three) times daily as needed for muscle spasms., Disp: 60 tablet, Rfl: 0   lisinopril (ZESTRIL) 5 MG tablet, Take 5 mg by mouth daily., Disp: , Rfl:    loratadine (CLARITIN) 10 MG tablet, Take 1 tablet (10 mg total) by mouth daily. (Patient taking differently: Take 10 mg by mouth daily as needed for allergies or rhinitis.), Disp: 20 tablet, Rfl: 0   meloxicam (MOBIC) 15 MG tablet, Take 1 tablet (15 mg total) by mouth daily., Disp: 30 tablet, Rfl: 0   ondansetron (ZOFRAN) 4 MG tablet, Take 1 tablet (4 mg total) by mouth every 8 (eight) hours as needed for nausea or vomiting., Disp: 20 tablet, Rfl: 0   predniSONE (STERAPRED UNI-PAK 21 TAB) 10 MG (21) TBPK tablet, Use as directed, Disp: 21 tablet, Rfl: 0  Observations/Objective: Patient is well-developed, well-nourished in no acute distress.  Resting comfortably  at home.  Head is normocephalic, atraumatic.  No labored breathing.  Speech is clear and coherent with logical content.  Patient is alert and oriented at baseline.    Assessment and Plan: 1. Acute bacterial sinusitis  Increase fluids, continue allergy meds and flonase, UC if sx persist or worsen.   Follow Up Instructions: I discussed the assessment and treatment plan with the patient. The patient was provided an opportunity to ask questions and all were answered. The patient agreed with the plan and demonstrated an understanding of the instructions.  A copy of instructions were sent to the patient via MyChart unless otherwise noted below.     The patient was advised to call back or seek an in-person evaluation if the symptoms worsen or if the condition fails to improve as anticipated.  Time:  I spent 10 minutes with the patient via telehealth technology discussing the above problems/concerns.    Georgana Curio, FNP

## 2023-07-18 ENCOUNTER — Ambulatory Visit: Payer: Medicaid Other | Admitting: Obstetrics and Gynecology

## 2023-07-18 ENCOUNTER — Other Ambulatory Visit (HOSPITAL_COMMUNITY)
Admission: RE | Admit: 2023-07-18 | Discharge: 2023-07-18 | Disposition: A | Discharge status: Home / Self Care | Payer: Medicaid Other | Source: Ambulatory Visit | Attending: Obstetrics and Gynecology | Admitting: Obstetrics and Gynecology

## 2023-07-18 ENCOUNTER — Other Ambulatory Visit: Payer: Self-pay

## 2023-07-18 ENCOUNTER — Encounter: Payer: Self-pay | Admitting: Family Medicine

## 2023-07-18 ENCOUNTER — Encounter: Payer: Self-pay | Admitting: Obstetrics and Gynecology

## 2023-07-18 VITALS — BP 94/65 | HR 85 | Wt 199.3 lb

## 2023-07-18 DIAGNOSIS — N946 Dysmenorrhea, unspecified: Secondary | ICD-10-CM | POA: Diagnosis not present

## 2023-07-18 DIAGNOSIS — Z124 Encounter for screening for malignant neoplasm of cervix: Secondary | ICD-10-CM | POA: Diagnosis present

## 2023-07-18 DIAGNOSIS — Z113 Encounter for screening for infections with a predominantly sexual mode of transmission: Secondary | ICD-10-CM | POA: Diagnosis not present

## 2023-07-18 DIAGNOSIS — N871 Moderate cervical dysplasia: Secondary | ICD-10-CM

## 2023-07-18 DIAGNOSIS — Z01419 Encounter for gynecological examination (general) (routine) without abnormal findings: Secondary | ICD-10-CM

## 2023-07-18 NOTE — Progress Notes (Signed)
ANNUAL EXAM Patient name: Margaret Cline MRN [id number]  Date of birth: 1991-03-17 Chief Complaint:   Gynecologic Exam  History of Present Illness:   Stpehanie Montroy is a 32 y.o. [id number] being seen today for a routine annual exam.  Current complaints: annual, pap, cramping  Menstrual concerns? Yes  cramping but no menses. Will get the symptoms of the menses but not having the bleeding, may have spot. Having strong cramps - takes motrin/ibuprofen currently. Prior to nexplaon, can't remember. No pain with intercourse, no dyszhezie or dysuria Breast or nipple changes? No  Contraception use? Yes nexplanon and condoms (nexplanon inserted 07/2021) has completed childbearing Sexually active? Yes  No LMP recorded. Patient has had an implant.   The pregnancy intention screening data noted above was reviewed. Potential methods of contraception were discussed. The patient elected to proceed with No data recorded.   Last pap   03/07/21 - HSIL at [facility], in media tab 06/09/21 - colposcopy CIN-2 07/26/21 - LEEP CIN-1, focal of CIN-2, CIN-1 on ECC, negative margins 03/19/22 - colposcopy CIN-1 5 & 7 oclock and on ECC     Component Value Date/Time   DIAGPAP  12/10/2016 0000    NEGATIVE FOR INTRAEPITHELIAL LESIONS OR MALIGNANCY.   ADEQPAP  12/10/2016 0000    Satisfactory for evaluation  endocervical/transformation zone component PRESENT.     H/O abnormal pap: yes Last mammogram: n/a.  Last colonoscopy: n/a.      03/19/2022    4:44 PM 10/05/2021    8:42 AM 08/23/2021    9:16 AM 07/28/2021   11:20 AM 06/04/2019    3:48 PM  Depression screen PHQ 2/9  Decreased Interest 0 0 2 1 0  Down, Depressed, Hopeless 1 1 1 1  0  PHQ - 2 Score 1 1 3 2  0  Altered sleeping 1 0 2 1   Tired, decreased energy 1 3 2 1    Change in appetite 1 3 2 1    Feeling bad or failure about yourself  0 0 1 1   Trouble concentrating 0 0 0 1   Moving slowly or fidgety/restless 0 0 0 0   Suicidal thoughts 0 0 0 0   PHQ-9  Score 4 7 10 7          03/19/2022    4:44 PM 10/05/2021    8:43 AM 08/23/2021    9:16 AM 07/28/2021   11:26 AM  GAD 7 : Generalized Anxiety Score  Nervous, Anxious, on Edge 0 1 2 0  Control/stop worrying 1 0 2   Worry too much - different things 1 1 2    Trouble relaxing 0 0 2   Restless 0 1 2   Easily annoyed or irritable 0 1 2   Afraid - awful might happen 0 0 2   Total GAD 7 Score 2 4 14       Review of Systems:   Pertinent items are noted in HPI Denies any headaches, blurred vision, fatigue, shortness of breath, chest pain, abdominal pain, abnormal vaginal discharge/itching/odor/irritation, problems with periods, bowel movements, urination, or intercourse unless otherwise stated above. Pertinent History Reviewed:  Reviewed past medical,surgical, social and family history.  Reviewed problem list, medications and allergies. Physical Assessment:   Vitals:   07/18/23 1602  BP: 94/65  Pulse: 85  Weight: 199 lb 4.8 oz (90.4 kg)  There is no height or weight on file to calculate BMI.        Physical Examination:   General appearance - well  appearing, and in no distress  Mental status - alert, oriented to person, place, and time  Psych:  She has a normal mood and affect  Skin - warm and dry, normal color, no suspicious lesions noted  Chest - effort normal, all lung fields clear to auscultation bilaterally  Heart - normal rate and regular rhythm  Breasts - breasts appear normal, no suspicious masses, no skin or nipple changes or  axillary nodes  Abdomen - soft, nontender, nondistended, no masses or organomegaly  Pelvic -  VULVA: normal appearing vulva with no masses, tenderness or lesions   VAGINA: normal appearing vagina with normal color and discharge, no lesions   CERVIX: normal appearing cervix without discharge or lesions, no CMT  Thin prep pap is done with HR HPV cotesting  Extremities:  No swelling or varicosities noted  Chaperone present for exam  No results found  for this or any previous visit (from the past 24 hour(s)).    Assessment & Plan:  1. Well woman exam with routine gynecological exam - Cervical cancer screening: Discussed guidelines. Pap with HPV collected - GC/CT: accepts - Birth Control: Nexplanon - Breast Health: Encouraged self breast awareness/SBE. Teaching provided.  - F/U 12 months and prn   2. Dysplasia of cervix, high grade CIN 2 3. Screening for cervical cancer Hx of LEEP for CIN-2 with CIN-1 on post-procedure colpo, repeat pap completed today - Cytology - PAP  4. Screening examination for STI - Cervicovaginal ancillary only - HIV Antibody (routine testing w rflx) - RPR - Hepatitis B Surface AntiGEN - Hepatitis C Antibody  5. Dysmenorrhea Cramping without menses within the last few months. Has had nexplanon x2 years and intends to continue as she has completed childbearing. Continue OTC. Can get pelvic US if persistently bothersome or worsening and offered early exchange of nexplanon.    Orders Placed This Encounter  Procedures   HIV Antibody (routine testing w rflx)   RPR   Hepatitis B Surface AntiGEN   Hepatitis C Antibody    Meds: No orders of the defined types were placed in this encounter.   Follow-up: Return if symptoms worsen or fail to improve, for Annual GYN.  Lorriane Shire, MD 07/18/2023 3:59 PM

## 2023-07-19 LAB — HIV ANTIBODY (ROUTINE TESTING W REFLEX): HIV Screen 4th Generation wRfx: NONREACTIVE

## 2023-07-19 LAB — HEPATITIS B SURFACE ANTIGEN: Hepatitis B Surface Ag: NEGATIVE

## 2023-07-19 LAB — HEPATITIS C ANTIBODY: Hep C Virus Ab: NONREACTIVE

## 2023-07-19 LAB — RPR: RPR Ser Ql: NONREACTIVE

## 2023-07-22 ENCOUNTER — Other Ambulatory Visit: Payer: Self-pay | Admitting: Obstetrics and Gynecology

## 2023-07-22 ENCOUNTER — Encounter: Payer: Self-pay | Admitting: Obstetrics and Gynecology

## 2023-07-22 DIAGNOSIS — B9689 Other specified bacterial agents as the cause of diseases classified elsewhere: Secondary | ICD-10-CM

## 2023-07-22 DIAGNOSIS — B3731 Acute candidiasis of vulva and vagina: Secondary | ICD-10-CM

## 2023-07-22 LAB — CERVICOVAGINAL ANCILLARY ONLY
Bacterial Vaginitis (gardnerella): POSITIVE — AB
Candida Glabrata: NEGATIVE
Candida Vaginitis: NEGATIVE
Chlamydia: NEGATIVE
Comment: NEGATIVE
Comment: NEGATIVE
Comment: NEGATIVE
Comment: NEGATIVE
Comment: NEGATIVE
Comment: NORMAL
Neisseria Gonorrhea: NEGATIVE
Trichomonas: NEGATIVE

## 2023-07-22 MED ORDER — METRONIDAZOLE 500 MG PO TABS: 500.0000 mg | ORAL_TABLET | Freq: Two times a day (BID) | ORAL | 0 refills | Status: AC

## 2023-07-22 MED ORDER — FLUCONAZOLE 150 MG PO TABS: 150.0000 mg | ORAL_TABLET | Freq: Once | ORAL | 0 refills | Status: AC

## 2023-07-23 LAB — CYTOLOGY - PAP
Comment: NEGATIVE
Diagnosis: NEGATIVE
Diagnosis: REACTIVE
High risk HPV: NEGATIVE

## 2023-09-24 ENCOUNTER — Other Ambulatory Visit: Payer: Self-pay

## 2023-09-24 ENCOUNTER — Emergency Department (HOSPITAL_COMMUNITY): Payer: Medicaid Other

## 2023-09-24 ENCOUNTER — Encounter (HOSPITAL_COMMUNITY): Payer: Self-pay

## 2023-09-24 ENCOUNTER — Emergency Department (HOSPITAL_COMMUNITY)
Admission: EM | Admit: 2023-09-24 | Discharge: 2023-09-24 | Disposition: A | Discharge status: Home / Self Care | Payer: Medicaid Other | Attending: Emergency Medicine | Admitting: Emergency Medicine

## 2023-09-24 DIAGNOSIS — J45909 Unspecified asthma, uncomplicated: Secondary | ICD-10-CM | POA: Insufficient documentation

## 2023-09-24 DIAGNOSIS — R112 Nausea with vomiting, unspecified: Secondary | ICD-10-CM | POA: Diagnosis not present

## 2023-09-24 DIAGNOSIS — B9689 Other specified bacterial agents as the cause of diseases classified elsewhere: Secondary | ICD-10-CM | POA: Insufficient documentation

## 2023-09-24 DIAGNOSIS — I10 Essential (primary) hypertension: Secondary | ICD-10-CM | POA: Insufficient documentation

## 2023-09-24 DIAGNOSIS — N760 Acute vaginitis: Secondary | ICD-10-CM | POA: Diagnosis not present

## 2023-09-24 DIAGNOSIS — R1031 Right lower quadrant pain: Secondary | ICD-10-CM | POA: Diagnosis present

## 2023-09-24 LAB — URINALYSIS, ROUTINE W REFLEX MICROSCOPIC
Bacteria, UA: NONE SEEN
Bilirubin Urine: NEGATIVE
Glucose, UA: NEGATIVE mg/dL
Ketones, ur: 5 mg/dL — AB
Leukocytes,Ua: NEGATIVE
Nitrite: NEGATIVE
Protein, ur: 30 mg/dL — AB
Specific Gravity, Urine: 1.034 — ABNORMAL HIGH (ref 1.005–1.030)
pH: 5 (ref 5.0–8.0)

## 2023-09-24 LAB — COMPREHENSIVE METABOLIC PANEL
ALT: 11 U/L (ref 0–44)
AST: 14 U/L — ABNORMAL LOW (ref 15–41)
Albumin: 4 g/dL (ref 3.5–5.0)
Alkaline Phosphatase: 43 U/L (ref 38–126)
Anion gap: 9 (ref 5–15)
BUN: 13 mg/dL (ref 6–20)
CO2: 23 mmol/L (ref 22–32)
Calcium: 8.9 mg/dL (ref 8.9–10.3)
Chloride: 106 mmol/L (ref 98–111)
Creatinine, Ser: 0.55 mg/dL (ref 0.44–1.00)
GFR, Estimated: >60 mL/min (ref >60)
Glucose, Bld: 91 mg/dL (ref 70–99)
Potassium: 3.4 mmol/L — ABNORMAL LOW (ref 3.5–5.1)
Sodium: 138 mmol/L (ref 135–145)
Total Bilirubin: 1.1 mg/dL (ref <1.2)
Total Protein: 7.7 g/dL (ref 6.5–8.1)

## 2023-09-24 LAB — WET PREP, GENITAL
Sperm: NONE SEEN
Trich, Wet Prep: NONE SEEN
WBC, Wet Prep HPF POC: <10 (ref <10)
Yeast Wet Prep HPF POC: NONE SEEN

## 2023-09-24 LAB — CBC
HCT: 43 % (ref 36.0–46.0)
Hemoglobin: 15 g/dL (ref 12.0–15.0)
MCH: 31.6 pg (ref 26.0–34.0)
MCHC: 34.9 g/dL (ref 30.0–36.0)
MCV: 90.5 fL (ref 80.0–100.0)
Platelets: 244 10*3/uL (ref 150–400)
RBC: 4.75 MIL/uL (ref 3.87–5.11)
RDW: 14.1 % (ref 11.5–15.5)
WBC: 9.5 10*3/uL (ref 4.0–10.5)
nRBC: 0 % (ref 0.0–0.2)

## 2023-09-24 LAB — LIPASE, BLOOD: Lipase: 26 U/L (ref 11–51)

## 2023-09-24 LAB — HCG, SERUM, QUALITATIVE: Preg, Serum: NEGATIVE

## 2023-09-24 MED ORDER — SODIUM CHLORIDE 0.9 % IV BOLUS
1000.0000 mL | Freq: Once | INTRAVENOUS | Status: AC
  Administered 2023-09-24: 1000 mL via INTRAVENOUS

## 2023-09-24 MED ORDER — ONDANSETRON HCL 4 MG/2ML IJ SOLN
4.0000 mg | Freq: Once | INTRAMUSCULAR | Status: AC
  Administered 2023-09-24: 4 mg via INTRAVENOUS
  Filled 2023-09-24: qty 2

## 2023-09-24 MED ORDER — MORPHINE SULFATE (PF) 4 MG/ML IV SOLN
4.0000 mg | Freq: Once | INTRAVENOUS | Status: AC
  Administered 2023-09-24: 4 mg via INTRAVENOUS
  Filled 2023-09-24: qty 1

## 2023-09-24 MED ORDER — ONDANSETRON 4 MG PO TBDP: 4.0000 mg | ORAL_TABLET | Freq: Three times a day (TID) | ORAL | 0 refills | Status: DC | PRN

## 2023-09-24 MED ORDER — DICYCLOMINE HCL 20 MG PO TABS: 20.0000 mg | ORAL_TABLET | Freq: Three times a day (TID) | ORAL | 0 refills | Status: AC | PRN

## 2023-09-24 MED ORDER — FLUCONAZOLE 150 MG PO TABS: 150.0000 mg | ORAL_TABLET | Freq: Every day | ORAL | 0 refills | Status: AC | PRN

## 2023-09-24 MED ORDER — IOHEXOL 300 MG/ML  SOLN
100.0000 mL | Freq: Once | INTRAMUSCULAR | Status: AC | PRN
  Administered 2023-09-24: 100 mL via INTRAVENOUS

## 2023-09-24 MED ORDER — METRONIDAZOLE 500 MG PO TABS: 500.0000 mg | ORAL_TABLET | Freq: Two times a day (BID) | ORAL | 0 refills | Status: AC

## 2023-09-24 MED ORDER — METOCLOPRAMIDE HCL 5 MG/ML IJ SOLN
5.0000 mg | Freq: Once | INTRAMUSCULAR | Status: AC
  Administered 2023-09-24: 5 mg via INTRAVENOUS
  Filled 2023-09-24: qty 2

## 2023-09-24 NOTE — ED Notes (Signed)
Patient transported to US 

## 2023-09-24 NOTE — ED Notes (Signed)
Pt aware urine specimen needed cup given

## 2023-09-24 NOTE — ED Notes (Signed)
10:21 PM  Discharge instructions discussed with patient and patient voices understanding of discharge instructions. Patient is stable and in NAD. Prescription information discussed with patient and she is able to reiterate patient information using teach back method. Patient encouraged to do follow up with PCP, to which she voices understanding. Patient called family for transport home.

## 2023-09-24 NOTE — Discharge Instructions (Signed)

## 2023-09-24 NOTE — ED Provider Notes (Signed)
Emergency Department Provider Note   I have reviewed the triage vital signs and the nursing notes.   HISTORY  Chief Complaint Emesis   HPI Margaret Cline is a 32 y.o. female with past history reviewed below presents emergency department with abdominal pain, vomiting, diarrhea.  Symptoms awoke her from sleep at 3 AM and have been moderate to severe in intensity.  She describes a cramping, right-sided abdominal pain.  No known fevers.  No blood in the emesis or stool.  She has had multiple episodes of vomiting and diarrhea.  Denies any alcohol or drug use.  Specifically denies marijuana use. No CP or SOB symptoms.    Past Medical History:  Diagnosis Date   Anemia    Fe x2   Asthma    Chlamydia 2008   Chronic pain    Constipation, chronic 11/13/2011   Depression    postpartum - no meds   Gonorrhea    History of chlamydia 03/07/2012   History of trichomoniasis 03/07/2012   Hx: UTI (urinary tract infection)    Frequently   Hypertension    Irregular periods/menstrual cycles    Migraines    Migraines - no med    Obese    Trichomonas 2008   Vaginal delivery--shoulder dystocia 07/10/2012    Review of Systems  Constitutional: No fever/chills Cardiovascular: Denies chest pain. Respiratory: Denies shortness of breath. Gastrointestinal: Positive abdominal pain. Positive nausea, vomiting, and diarrhea.  No constipation. Genitourinary: Negative for dysuria. Musculoskeletal: Negative for back pain. Skin: Negative for rash. Neurological: Negative for headaches.  ____________________________________________   PHYSICAL EXAM:  VITAL SIGNS: ED Triage Vitals  Encounter Vitals Group     BP 09/24/23 1523 96/67     Pulse Rate 09/24/23 1523 80     Resp 09/24/23 1523 18     Temp 09/24/23 1523 98 F (36.7 C)     Temp Source 09/24/23 1523 Oral     SpO2 09/24/23 1523 100 %     Weight 09/24/23 1525 198 lb 6.6 oz (90 kg)     Height 09/24/23 1525 5\' 8"  (1.727 m)    Constitutional: Alert and oriented. Well appearing and in no acute distress. Eyes: Conjunctivae are normal.  Head: Atraumatic Nose: No congestion/rhinnorhea. Mouth/Throat: Mucous membranes are moist.   Neck: No stridor.  Cardiovascular: Normal rate, regular rhythm. Good peripheral circulation. Grossly normal heart sounds.   Respiratory: Normal respiratory effort.  No retractions. Lungs CTAB. Gastrointestinal: Soft with mild tenderness in the right abdomen. No rebound or guarding. No distention.  Musculoskeletal: No gross deformities of extremities. Neurologic:  Normal speech and language. Skin:  Skin is warm, dry and intact. No rash noted.  ____________________________________________   LABS (all labs ordered are listed, but only abnormal results are displayed)  Labs Reviewed  WET PREP, GENITAL - Abnormal; Notable for the following components:      Result Value   Clue Cells Wet Prep HPF POC PRESENT (*)    All other components within normal limits  COMPREHENSIVE METABOLIC PANEL - Abnormal; Notable for the following components:   Potassium 3.4 (*)    AST 14 (*)    All other components within normal limits  URINALYSIS, ROUTINE W REFLEX MICROSCOPIC - Abnormal; Notable for the following components:   Color, Urine AMBER (*)    APPearance TURBID (*)    Specific Gravity, Urine 1.034 (*)    Hgb urine dipstick MODERATE (*)    Ketones, ur 5 (*)    Protein, ur 30 (*)  All other components within normal limits  LIPASE, BLOOD  CBC  HCG, SERUM, QUALITATIVE  GC/CHLAMYDIA PROBE AMP ([place]) NOT AT [facility]   ____________________________________________  RADIOLOGY  US Pelvis Complete  Result Date: 09/24/2023 CLINICAL DATA:  Initial evaluation for acute right lower quadrant pain. EXAM: TRANSABDOMINAL AND TRANSVAGINAL ULTRASOUND OF PELVIS DOPPLER ULTRASOUND OF OVARIES TECHNIQUE: Both transabdominal and transvaginal ultrasound examinations of the pelvis were performed. Transabdominal  technique was performed for global imaging of the pelvis including uterus, ovaries, adnexal regions, and pelvic cul-de-sac. It was necessary to proceed with endovaginal exam following the transabdominal exam to visualize the endometrium. Color and duplex Doppler ultrasound was utilized to evaluate blood flow to the ovaries. COMPARISON:  CT from earlier the same day. FINDINGS: Uterus Measurements: 8.6 x 5.2 x 6.2 cm = volume: 146.1 mL. Uterus is retroverted. No discrete fibroid or other myometrial abnormality. Endometrium Thickness: 3.9 mm.  No focal abnormality visualized. Right ovary Measurements: 3.6 x 2.5 x 1.8 cm = volume: 8.7 mL. Normal appearance/no adnexal mass. Left ovary Measurements: 6.0 x 2.7 x 2.8 cm = volume: 24.2 mL. 4.6 x 2.4 x 2.4 cm simple cyst. Small internal daughter cyst without additional significant internal complexity. No vascularity or solid nodularity. Pulsed Doppler evaluation of both ovaries demonstrates normal low-resistance arterial and venous waveforms. Other findings Trace free fluid seen within the pelvis. IMPRESSION: 1. 4.6 cm simple left ovarian cyst, favored to reflect a follicular cyst. This is almost certainly benign given size and patient age, with no follow up imaging recommended. Note: This recommendation does not apply to premenarchal patients or to those with increased risk (genetic, family history, elevated tumor markers or other high-risk factors) of ovarian cancer. Reference: Radiology 2019 Nov; 293(2):359-371. 2. Trace free fluid within the pelvis, nonspecific, but most commonly physiologic. 3. Otherwise negative pelvic ultrasound. No evidence for ovarian torsion. Electronically Signed   By: Rise Mu M.D.   On: 09/24/2023 20:03   US Transvaginal Non-OB  Result Date: 09/24/2023 CLINICAL DATA:  Initial evaluation for acute right lower quadrant pain. EXAM: TRANSABDOMINAL AND TRANSVAGINAL ULTRASOUND OF PELVIS DOPPLER ULTRASOUND OF OVARIES TECHNIQUE: Both  transabdominal and transvaginal ultrasound examinations of the pelvis were performed. Transabdominal technique was performed for global imaging of the pelvis including uterus, ovaries, adnexal regions, and pelvic cul-de-sac. It was necessary to proceed with endovaginal exam following the transabdominal exam to visualize the endometrium. Color and duplex Doppler ultrasound was utilized to evaluate blood flow to the ovaries. COMPARISON:  CT from earlier the same day. FINDINGS: Uterus Measurements: 8.6 x 5.2 x 6.2 cm = volume: 146.1 mL. Uterus is retroverted. No discrete fibroid or other myometrial abnormality. Endometrium Thickness: 3.9 mm.  No focal abnormality visualized. Right ovary Measurements: 3.6 x 2.5 x 1.8 cm = volume: 8.7 mL. Normal appearance/no adnexal mass. Left ovary Measurements: 6.0 x 2.7 x 2.8 cm = volume: 24.2 mL. 4.6 x 2.4 x 2.4 cm simple cyst. Small internal daughter cyst without additional significant internal complexity. No vascularity or solid nodularity. Pulsed Doppler evaluation of both ovaries demonstrates normal low-resistance arterial and venous waveforms. Other findings Trace free fluid seen within the pelvis. IMPRESSION: 1. 4.6 cm simple left ovarian cyst, favored to reflect a follicular cyst. This is almost certainly benign given size and patient age, with no follow up imaging recommended. Note: This recommendation does not apply to premenarchal patients or to those with increased risk (genetic, family history, elevated tumor markers or other high-risk factors) of ovarian cancer. Reference: Radiology 2019  Nov; 293(2):359-371. 2. Trace free fluid within the pelvis, nonspecific, but most commonly physiologic. 3. Otherwise negative pelvic ultrasound. No evidence for ovarian torsion. Electronically Signed   By: Rise Mu M.D.   On: 09/24/2023 20:03   [place] Art/Ven Flow Abd Pelv Doppler  Result Date: 09/24/2023 CLINICAL DATA:  Initial evaluation for acute right lower quadrant  pain. EXAM: TRANSABDOMINAL AND TRANSVAGINAL ULTRASOUND OF PELVIS DOPPLER ULTRASOUND OF OVARIES TECHNIQUE: Both transabdominal and transvaginal ultrasound examinations of the pelvis were performed. Transabdominal technique was performed for global imaging of the pelvis including uterus, ovaries, adnexal regions, and pelvic cul-de-sac. It was necessary to proceed with endovaginal exam following the transabdominal exam to visualize the endometrium. Color and duplex Doppler ultrasound was utilized to evaluate blood flow to the ovaries. COMPARISON:  CT from earlier the same day. FINDINGS: Uterus Measurements: 8.6 x 5.2 x 6.2 cm = volume: 146.1 mL. Uterus is retroverted. No discrete fibroid or other myometrial abnormality. Endometrium Thickness: 3.9 mm.  No focal abnormality visualized. Right ovary Measurements: 3.6 x 2.5 x 1.8 cm = volume: 8.7 mL. Normal appearance/no adnexal mass. Left ovary Measurements: 6.0 x 2.7 x 2.8 cm = volume: 24.2 mL. 4.6 x 2.4 x 2.4 cm simple cyst. Small internal daughter cyst without additional significant internal complexity. No vascularity or solid nodularity. Pulsed Doppler evaluation of both ovaries demonstrates normal low-resistance arterial and venous waveforms. Other findings Trace free fluid seen within the pelvis. IMPRESSION: 1. 4.6 cm simple left ovarian cyst, favored to reflect a follicular cyst. This is almost certainly benign given size and patient age, with no follow up imaging recommended. Note: This recommendation does not apply to premenarchal patients or to those with increased risk (genetic, family history, elevated tumor markers or other high-risk factors) of ovarian cancer. Reference: Radiology 2019 Nov; 293(2):359-371. 2. Trace free fluid within the pelvis, nonspecific, but most commonly physiologic. 3. Otherwise negative pelvic ultrasound. No evidence for ovarian torsion. Electronically Signed   By: Rise Mu M.D.   On: 09/24/2023 20:03   CT ABDOMEN PELVIS W  CONTRAST  Result Date: 09/24/2023 CLINICAL DATA:  Right lower quadrant pain and tenderness. Nausea and vomiting. Diarrhea. EXAM: CT ABDOMEN AND PELVIS WITH CONTRAST TECHNIQUE: Multidetector CT imaging of the abdomen and pelvis was performed using the standard protocol following bolus administration of intravenous contrast. RADIATION DOSE REDUCTION: This exam was performed according to the departmental dose-optimization program which includes automated exposure control, adjustment of the mA and/or kV according to patient size and/or use of iterative reconstruction technique. CONTRAST:  OMNIPAQUE IOHEXOL 300 MG/ML  SOLN COMPARISON:  01/11/2015 FINDINGS: Lower Chest: No acute findings. Hepatobiliary: No suspicious hepatic masses identified. Gallbladder is unremarkable. No evidence of biliary ductal dilatation. Pancreas:  No mass or inflammatory changes. Spleen: Within normal limits in size and appearance. Adrenals/Urinary Tract: No suspicious masses identified. No evidence of ureteral calculi or hydronephrosis. Stomach/Bowel: No evidence of obstruction, inflammatory process or abnormal fluid collections. Normal appendix visualized. Vascular/Lymphatic: No pathologically enlarged lymph nodes. No acute vascular findings. Reproductive:  No mass or other significant abnormality. Other:  None. Musculoskeletal:  No suspicious bone lesions identified. IMPRESSION: Negative. No radiographic evidence of appendicitis or other significant abnormality. Electronically Signed   By: Danae Orleans M.D.   On: 09/24/2023 18:32    ____________________________________________   PROCEDURES  Procedure(s) performed:   Procedures  None  ____________________________________________   INITIAL IMPRESSION / ASSESSMENT AND PLAN / ED COURSE  Pertinent labs & imaging results that were available during my  care of the patient were reviewed by me and considered in my medical decision making (see chart for details).   This  patient is Presenting for Evaluation of abdominal pain, which does require a range of treatment options, and is a complaint that involves a high risk of morbidity and mortality.  The Differential Diagnoses includes but is not exclusive to ectopic pregnancy, ovarian cyst, ovarian torsion, acute appendicitis, urinary tract infection, endometriosis, bowel obstruction, hernia, colitis, renal colic, gastroenteritis, volvulus etc.   Critical Interventions-    Medications  sodium chloride 0.9 % bolus 1,000 mL (0 mLs Intravenous Stopped 09/24/23 1936)  morphine (PF) 4 MG/ML injection 4 mg (4 mg Intravenous Given 09/24/23 1706)  ondansetron (ZOFRAN) injection 4 mg (4 mg Intravenous Given 09/24/23 1706)  iohexol (OMNIPAQUE) 300 MG/ML solution 100 mL (100 mLs Intravenous Contrast Given 09/24/23 1731)  morphine (PF) 4 MG/ML injection 4 mg (4 mg Intravenous Given 09/24/23 1853)  metoCLOPramide (REGLAN) injection 5 mg (5 mg Intravenous Given 09/24/23 1951)    Reassessment after intervention:  pain/nausea improved.   Clinical Laboratory Tests Ordered, included CBC without anemia or leukocytosis.  Pregnancy negative.  LFTs and bilirubin normal.  Lipase normal.  Radiologic Tests Ordered, included CT abd/pelvis. I independently interpreted the images and agree with radiology interpretation.   Cardiac Monitor Tracing which shows NSR.    Social Determinants of Health Risk patient is a non-smoker.   Medical Decision Making: Summary:  Patient presents to the emergency department with abdominal pain, vomiting, diarrhea.  Mild tenderness in the right abdomen without rebound or guarding.  Plan for IV fluids, pain management, CT and reassess.  Reevaluation with update and discussion with patient. Symptoms improved. Tolerating PO here. Stable for discharge. Will cover for BV. Discussed disulfiram effect of Flagyl.    Patient's presentation is most consistent with acute, uncomplicated illness.   Disposition:  discharge  ____________________________________________  FINAL CLINICAL IMPRESSION(S) / ED DIAGNOSES  Final diagnoses:  Right lower quadrant abdominal pain  Nausea and vomiting, unspecified vomiting type  BV (bacterial vaginosis)     NEW OUTPATIENT MEDICATIONS STARTED DURING THIS VISIT:  Discharge Medication List as of 09/24/2023 10:11 PM     START taking these medications   Details  dicyclomine (BENTYL) 20 MG tablet Take 1 tablet (20 mg total) by mouth 3 (three) times daily as needed for spasms., Starting Tue 09/24/2023, Normal    fluconazole (DIFLUCAN) 150 MG tablet Take 1 tablet (150 mg total) by mouth daily as needed for up to 2 days., Starting Tue 09/24/2023, Until Thu 09/26/2023 at 2359, Normal    metroNIDAZOLE (FLAGYL) 500 MG tablet Take 1 tablet (500 mg total) by mouth 2 (two) times daily for 7 days., Starting Tue 09/24/2023, Until Tue 10/01/2023, Normal    ondansetron (ZOFRAN-ODT) 4 MG disintegrating tablet Take 1 tablet (4 mg total) by mouth every 8 (eight) hours as needed., Starting Tue 09/24/2023, Normal        Note:  This document was prepared using Dragon voice recognition software and may include unintentional dictation errors.  Alona Bene, MD, [facility] Emergency Medicine    Sadler Teschner, Arlyss Repress, MD 09/24/23 [phone]

## 2023-09-24 NOTE — ED Triage Notes (Signed)
BIBA c/o n/v/d and right side abd pain waking her from sleep at 3am.  Abd soft and tender on palpitation. Pain described as cramping.

## 2023-09-24 NOTE — ED Notes (Signed)
Patient to triage with EMS in wheelchair

## 2023-09-24 NOTE — ED Notes (Signed)
Patient ambulatory to restroom with EMS prior to triage. Patient reports diarrhea

## 2023-09-25 LAB — GC/CHLAMYDIA PROBE AMP (~~LOC~~) NOT AT ARMC
Chlamydia: NEGATIVE
Comment: NEGATIVE
Comment: NORMAL
Neisseria Gonorrhea: NEGATIVE

## 2023-10-29 NOTE — Progress Notes (Signed)
 GYNECOLOGY VISIT  Patient name: Margaret Cline MRN [id number]  Date of birth: 1991/09/20 Chief Complaint:   Gynecologic Exam  History:  Margaret Cline is a 33 y.o. [id number] being seen today for ED visit follow up.  Tyipcally doesn't have menses. 2d prior to ED presentation; had vomiting, abdominal pain and had prolonged bleeding. Using a pad and changes it for comfort. Did CT and pelvic US . Having sporadic cramping. Not sexually active since ocotber. The bleood was pink and mucosy.   Stopped bleeidng on jan 3. Had abdominal symptoms resolved  no new edications, no intercourse ince October, no known sick contacts. Had not had anythign like before.  Pain was cramping and dull pain in the back causing dividutlyw with lgying adown and sitting. Was not able to tolerate PO during that ED visit. The bleding was light pink and then some gray (not like atypical menses).   Has taken flexeril  and didn't help much;   On ABX for the HS with recurrent yeast infections and now currently on PCN for tooth infection and will get BV following yeast.   Nexplaon in place since 2022 (gets it replaced every 3 years; correlated with daughter birth)   ED visit 09/24/2023 for abdominal pain, vomiting, and diarrhea. CT A/P negative; pelvic US  w/ 4.6 x 2.4  x 2.4cm simple cyst w/ normal flow   Past Medical History:  Diagnosis Date   Anemia    Fe x2   Asthma    Chlamydia 2008   Chronic pain    Constipation, chronic 11/13/2011   Depression    postpartum - no meds   Gonorrhea    History of chlamydia 03/07/2012   History of trichomoniasis 03/07/2012   Hx: UTI (urinary tract infection)    Frequently   Hypertension    Irregular periods/menstrual cycles    Migraines    Migraines - no med    Obese    Trichomonas 2008   Vaginal delivery--shoulder dystocia 07/10/2012    Past Surgical History:  Procedure Laterality Date   COLPOSCOPY W/ BIOPSY / CURETTAGE  03/19/2022   INDUCED ABORTION     REMOVAL OF IMPLANON   ROD  07/28/2021   REMOVAL OF IMPLANON  ROD  07/28/2021   WISDOM TOOTH EXTRACTION      The following portions of the patient's history were reviewed and updated as appropriate: allergies, current medications, past family history, past medical history, past social history, past surgical history and problem list.   Health Maintenance:   Last pap     Component Value Date/Time   DIAGPAP  07/18/2023 1628    - Negative for Intraepithelial Lesions or Malignancy (NILM)   DIAGPAP - Benign reactive/reparative changes 07/18/2023 1628   DIAGPAP  12/10/2016 0000    NEGATIVE FOR INTRAEPITHELIAL LESIONS OR MALIGNANCY.   HPVHIGH Negative 07/18/2023 1628   ADEQPAP  07/18/2023 1628    Satisfactory for evaluation; transformation zone component PRESENT.   ADEQPAP  12/10/2016 0000    Satisfactory for evaluation  endocervical/transformation zone component PRESENT.    High Risk HPV: Positive  Adequacy:  Satisfactory for evaluation, transformation zone component PRESENT  Diagnosis:  Atypical squamous cells of undetermined significance (ASC-US )  Last mammogram: n/a   Review of Systems:  Pertinent items are noted in HPI. Comprehensive review of systems was otherwise negative.   Objective:  Physical Exam BP (!) 93/47   Pulse 87   Wt 197 lb (89.4 kg)   BMI 29.95 kg/m    Physical Exam Vitals and nursing  note reviewed. Exam conducted with a chaperone present.  Constitutional:      Appearance: Normal appearance.  HENT:     Head: Normocephalic and atraumatic.  Pulmonary:     Effort: Pulmonary effort is normal.     Breath sounds: Normal breath sounds.  Genitourinary:    General: Normal vulva.     Exam position: Lithotomy position.     Vagina: Normal.     Cervix: Cervical motion tenderness present.     Comments: Tender pelvic floor muscles  Skin:    General: Skin is warm and dry.  Neurological:     General: No focal deficit present.     Mental Status: She is alert.  Psychiatric:         Mood and Affect: Mood normal.        Behavior: Behavior normal.        Thought Content: Thought content normal.        Judgment: Judgment normal.        Assessment & Plan:   1. PID (acute pelvic inflammatory disease) (Primary) 2. Acute vaginitis 3. Pelvic pain Reviewed unclear if vaginal bleeding and GI symptoms due to same cause but given concurrent symptoms and findings on exam, will treat for presumed PID. Diflucan  rx sent to possible vaginal candida in setting of antibiotic use. Trial of muscle relaxer for pelvic floor myalgia. Follow up response in 2-3 weeks. Pain and GI symptoms not likely due to ovarian cyst.   - baclofen  (LIORESAL ) 10 MG tablet; Take 1 tablet (10 mg total) by mouth 2 (two) times daily as needed for muscle spasms.  Dispense: 30 each; Refill: 0 - doxycycline  (VIBRAMYCIN ) 100 MG capsule; Take 1 capsule (100 mg total) by mouth 2 (two) times daily for 14 days.  Dispense: 28 capsule; Refill: 0 - metroNIDAZOLE  (FLAGYL ) 500 MG tablet; Take 1 tablet (500 mg total) by mouth 2 (two) times daily for 14 days.  Dispense: 28 tablet; Refill: 0 - fluconazole  (DIFLUCAN ) 150 MG tablet; Take 1 tablet (150 mg total) by mouth once for 1 dose. Take additional dose in 3 days if symptoms don't resolve  Dispense: 2 tablet; Refill: 2   Routine preventative health maintenance measures emphasized.  Margaret Pelton, MD [facility] for [facility], [facility]

## 2023-10-30 ENCOUNTER — Ambulatory Visit: Payer: Medicaid Other | Admitting: Obstetrics and Gynecology

## 2023-10-30 ENCOUNTER — Other Ambulatory Visit: Payer: Self-pay

## 2023-10-30 VITALS — BP 93/47 | HR 87 | Wt 197.0 lb

## 2023-10-30 DIAGNOSIS — R102 Pelvic and perineal pain unspecified side: Secondary | ICD-10-CM

## 2023-10-30 DIAGNOSIS — N760 Acute vaginitis: Secondary | ICD-10-CM

## 2023-10-30 DIAGNOSIS — N730 Acute parametritis and pelvic cellulitis: Secondary | ICD-10-CM | POA: Diagnosis not present

## 2023-10-30 DIAGNOSIS — Z1331 Encounter for screening for depression: Secondary | ICD-10-CM

## 2023-10-30 MED ORDER — BACLOFEN 10 MG PO TABS: 10.0000 mg | ORAL_TABLET | Freq: Two times a day (BID) | ORAL | 0 refills | Status: AC | PRN

## 2023-10-30 MED ORDER — DOXYCYCLINE HYCLATE 100 MG PO CAPS: 100.0000 mg | ORAL_CAPSULE | Freq: Two times a day (BID) | ORAL | 0 refills | Status: AC

## 2023-10-30 MED ORDER — METRONIDAZOLE 500 MG PO TABS: 500.0000 mg | ORAL_TABLET | Freq: Two times a day (BID) | ORAL | 0 refills | Status: AC

## 2023-10-30 MED ORDER — FLUCONAZOLE 150 MG PO TABS: 150.0000 mg | ORAL_TABLET | Freq: Once | ORAL | 2 refills | Status: DC

## 2023-11-08 ENCOUNTER — Ambulatory Visit
Admission: RE | Admit: 2023-11-08 | Discharge: 2023-11-08 | Disposition: A | Discharge status: Home / Self Care | Payer: Medicaid Other | Source: Ambulatory Visit | Attending: Family Medicine | Admitting: Family Medicine

## 2023-11-08 ENCOUNTER — Ambulatory Visit (INDEPENDENT_AMBULATORY_CARE_PROVIDER_SITE_OTHER): Payer: Medicaid Other | Admitting: Radiology

## 2023-11-08 DIAGNOSIS — S20211A Contusion of right front wall of thorax, initial encounter: Secondary | ICD-10-CM

## 2023-11-08 DIAGNOSIS — S8011XA Contusion of right lower leg, initial encounter: Secondary | ICD-10-CM

## 2023-11-08 NOTE — Discharge Instructions (Signed)
Over the counter NSAIDs for pain: Ibuprofen 400 mg ( Ibuprofen, Advil or Motrin, 2 tablets) and Acetaminophen 1 gram (3 of the regular strength or 2 extra strength tablets) 3-4 times a day,  take on an empty stomach before each meal and bedtime (every 6 hours) for a few days Do not take if you are pregnant/breastfeeding. Allergic to NSAIDs have a history of ulcers, intestinal bleeding or liver or kidney disease or have taken an opioid.   The x-ray reading we discussed is preliminary. Your x-ray will be read by a radiologist in next few hours. If there is a discrepancy, you will be contacted, and instructed on a new plan for you care.
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
## 2023-11-20 ENCOUNTER — Other Ambulatory Visit: Payer: Self-pay

## 2023-11-20 ENCOUNTER — Encounter: Payer: Self-pay | Admitting: Obstetrics and Gynecology

## 2023-11-20 ENCOUNTER — Ambulatory Visit: Payer: Medicaid Other | Admitting: Obstetrics and Gynecology

## 2023-11-20 VITALS — BP 118/85 | HR 101 | Wt 195.4 lb

## 2023-11-20 DIAGNOSIS — N760 Acute vaginitis: Secondary | ICD-10-CM

## 2023-11-20 DIAGNOSIS — B379 Candidiasis, unspecified: Secondary | ICD-10-CM | POA: Diagnosis not present

## 2023-11-20 DIAGNOSIS — Z1331 Encounter for screening for depression: Secondary | ICD-10-CM

## 2023-11-20 DIAGNOSIS — N730 Acute parametritis and pelvic cellulitis: Secondary | ICD-10-CM | POA: Diagnosis not present

## 2023-11-20 MED ORDER — CLOTRIMAZOLE 1 % VA CREA: 1.0000 | TOPICAL_CREAM | Freq: Every day | VAGINAL | 2 refills | Status: AC

## 2023-11-20 NOTE — Telephone Encounter (Signed)
 Received notification from pt's pharmacy to change dosage of clotrimazole  cream to .75% instead of 1% as pt's insurance dose not cover.  Message routed to Dr. Elester Grim for approval.    Manisha Cancel,RN  11/20/23

## 2023-11-20 NOTE — Progress Notes (Signed)
 GYNECOLOGY VISIT  Patient name: Margaret Cline MRN [id number]  Date of birth: 1991/02/10 Chief Complaint:   Pelvic Inflammatory Disease  History:  Margaret Cline is a 33 y.o. [id number] being seen today for PID follow up.  Having more of yeast infeciton , did not complete doxy/flagyl . Her bleeding and her pain have improved. No longer having GI symptoms. Used the diflucan  and did not have improvement in her symptoms. Has been using the muscle relaxer intermittently with relief.   Past Medical History:  Diagnosis Date   Anemia    Fe x2   Asthma    Chlamydia 2008   Chronic pain    Constipation, chronic 11/13/2011   Depression    postpartum - no meds   Gonorrhea    History of chlamydia 03/07/2012   History of trichomoniasis 03/07/2012   Hx: UTI (urinary tract infection)    Frequently   Hypertension    Irregular periods/menstrual cycles    Migraines    Migraines - no med    Obese    Trichomonas 2008   Vaginal delivery--shoulder dystocia 07/10/2012    Past Surgical History:  Procedure Laterality Date   COLPOSCOPY W/ BIOPSY / CURETTAGE  03/19/2022   INDUCED ABORTION     REMOVAL OF IMPLANON  ROD  07/28/2021   REMOVAL OF IMPLANON  ROD  07/28/2021   WISDOM TOOTH EXTRACTION      The following portions of the patient's history were reviewed and updated as appropriate: allergies, current medications, past family history, past medical history, past social history, past surgical history and problem list.   Health Maintenance:   Last pap     Component Value Date/Time   DIAGPAP  07/18/2023 1628    - Negative for Intraepithelial Lesions or Malignancy (NILM)   DIAGPAP - Benign reactive/reparative changes 07/18/2023 1628   DIAGPAP  12/10/2016 0000    NEGATIVE FOR INTRAEPITHELIAL LESIONS OR MALIGNANCY.   HPVHIGH Negative 07/18/2023 1628   ADEQPAP  07/18/2023 1628    Satisfactory for evaluation; transformation zone component PRESENT.   ADEQPAP  12/10/2016 0000    Satisfactory for  evaluation  endocervical/transformation zone component PRESENT.    High Risk HPV: Positive  Adequacy:  Satisfactory for evaluation, transformation zone component PRESENT  Diagnosis:  Atypical squamous cells of undetermined significance (ASC-US )  Last mammogram: n/a   Review of Systems:  Pertinent items are noted in HPI. Comprehensive review of systems was otherwise negative.   Objective:  Physical Exam BP 118/85   Pulse (!) 101   Wt 195 lb 6 oz (88.6 kg)   BMI 29.71 kg/m    Physical Exam Vitals and nursing note reviewed. Exam conducted with a chaperone present.  Constitutional:      Appearance: Normal appearance.  HENT:     Head: Normocephalic and atraumatic.  Pulmonary:     Effort: Pulmonary effort is normal.  Genitourinary:    General: Normal vulva.     Comments: No CMT Mild levator ani tenderness Skin:    General: Skin is warm and dry.  Neurological:     General: No focal deficit present.     Mental Status: She is alert.  Psychiatric:        Mood and Affect: Mood normal.        Behavior: Behavior normal.        Thought Content: Thought content normal.        Judgment: Judgment normal.        Assessment & Plan:   1. PID (  acute pelvic inflammatory disease) (Primary) Appears to be resolved though not able to tolerate treatment to complete. Will not continue ABX for now given intolerance.   2. Acute vaginitis Trial of vaginal treatment for yeast given no improvement with po med.  - clotrimazole  (GYNE-LOTRIMIN ) 1 % vaginal cream; Place 1 Applicatorful vaginally at bedtime for 7 days.  Dispense: 30 g; Refill: 2   Routine preventative health maintenance measures emphasized.  Carter Quarry, MD [facility] for [facility], [facility]

## 2024-02-04 ENCOUNTER — Telehealth: Admitting: Physician Assistant

## 2024-02-04 DIAGNOSIS — L0390 Cellulitis, unspecified: Secondary | ICD-10-CM

## 2024-02-04 DIAGNOSIS — S90562A Insect bite (nonvenomous), left ankle, initial encounter: Secondary | ICD-10-CM

## 2024-02-04 DIAGNOSIS — W57XXXA Bitten or stung by nonvenomous insect and other nonvenomous arthropods, initial encounter: Secondary | ICD-10-CM | POA: Diagnosis not present

## 2024-02-04 MED ORDER — MUPIROCIN 2 % EX OINT: 1.0000 | TOPICAL_OINTMENT | Freq: Two times a day (BID) | CUTANEOUS | 0 refills | Status: AC

## 2024-02-04 MED ORDER — CEPHALEXIN 500 MG PO CAPS: 500.0000 mg | ORAL_CAPSULE | Freq: Four times a day (QID) | ORAL | 0 refills | Status: AC

## 2024-02-04 MED ORDER — TRIAMCINOLONE ACETONIDE 0.1 % EX CREA: 1.0000 | TOPICAL_CREAM | Freq: Two times a day (BID) | CUTANEOUS | 0 refills | Status: DC

## 2024-02-04 NOTE — Patient Instructions (Signed)
 Serafina Damme, thank you for joining Angelia Kelp, PA-C for today's virtual visit.  While this provider is not your primary care provider (PCP), if your PCP is located in our provider database this encounter information will be shared with them immediately following your visit.   A [facility] MyChart account gives you access to today's visit and all your visits, tests, and labs performed at [facility] " click here if you don't have a [facility] MyChart account or go to [email or web address]  Consent: (Patient) Margaret Cline provided verbal consent for this virtual visit at the beginning of the encounter.  Current Medications:  Current Outpatient Medications:    cephALEXin  (KEFLEX ) 500 MG capsule, Take 1 capsule (500 mg total) by mouth 4 (four) times daily., Disp: 20 capsule, Rfl: 0   mupirocin  ointment (BACTROBAN ) 2 %, Apply 1 Application topically 2 (two) times daily., Disp: 22 g, Rfl: 0   triamcinolone  cream (KENALOG ) 0.1 %, Apply 1 Application topically 2 (two) times daily., Disp: 30 g, Rfl: 0   acetaminophen  (TYLENOL ) 500 MG tablet, Take 1,000 mg by mouth every 8 (eight) hours as needed for headache., Disp: , Rfl:    albuterol  (PROAIR  HFA) 108 (90 Base) MCG/ACT inhaler, Inhale 2 puffs into the lungs every 4 (four) hours as needed for wheezing or shortness of breath. For shortness of breath/wheezing, Disp: 18 g, Rfl: 1   albuterol  (PROVENTIL ) (2.5 MG/3ML) 0.083% nebulizer solution, Take 3 mLs (2.5 mg total) by nebulization every 4 (four) hours as needed for wheezing or shortness of breath., Disp: 30 vial, Rfl: 0   baclofen  (LIORESAL ) 10 MG tablet, Take 1 tablet (10 mg total) by mouth 2 (two) times daily as needed for muscle spasms., Disp: 30 each, Rfl: 0   dicyclomine  (BENTYL ) 20 MG tablet, Take 1 tablet (20 mg total) by mouth 3 (three) times daily as needed for spasms. (Patient not taking: Reported on 10/30/2023), Disp: 20 tablet, Rfl: 0   lisinopril (ZESTRIL) 5 MG  tablet, Take 5 mg by mouth daily., Disp: , Rfl:    loratadine  (CLARITIN ) 10 MG tablet, Take 1 tablet (10 mg total) by mouth daily., Disp: 20 tablet, Rfl: 0   meloxicam  (MOBIC ) 15 MG tablet, Take 1 tablet (15 mg total) by mouth daily. (Patient not taking: Reported on 10/30/2023), Disp: 30 tablet, Rfl: 0   ondansetron  (ZOFRAN ) 4 MG tablet, Take 1 tablet (4 mg total) by mouth every 8 (eight) hours as needed for nausea or vomiting. (Patient not taking: Reported on 10/30/2023), Disp: 20 tablet, Rfl: 0   ondansetron  (ZOFRAN -ODT) 4 MG disintegrating tablet, Take 1 tablet (4 mg total) by mouth every 8 (eight) hours as needed. (Patient not taking: Reported on 10/30/2023), Disp: 20 tablet, Rfl: 0   Medications ordered in this encounter:  Meds ordered this encounter  Medications   cephALEXin  (KEFLEX ) 500 MG capsule    Sig: Take 1 capsule (500 mg total) by mouth 4 (four) times daily.    Dispense:  20 capsule    Refill:  0    Supervising Provider:   LAMPTEY, PHILIP O [[id number]]   triamcinolone  cream (KENALOG ) 0.1 %    Sig: Apply 1 Application topically 2 (two) times daily.    Dispense:  30 g    Refill:  0    Supervising Provider:   LAMPTEY, PHILIP O [[id number]]   mupirocin  ointment (BACTROBAN ) 2 %    Sig: Apply 1 Application topically 2 (two) times daily.    Dispense:  22 g  Refill:  0    Supervising Provider:   Corine Dice [[id number]]     *If you need refills on other medications prior to your next appointment, please contact your pharmacy*  Follow-Up: Call back or seek an in-person evaluation if the symptoms worsen or if the condition fails to improve as anticipated.  [facility] [phone]  Other Instructions  Insect Bite, Adult An insect bite can make your skin red, itchy, and swollen. An insect bite is different from an insect sting, which happens when an insect injects poison (venom) into the skin. Some insects can spread disease to people through a bite. However, most  insect bites do not lead to disease and are not serious. What are the causes? Insects may bite for a variety of reasons, including: Hunger. To defend themselves. Insects that bite include: Spiders. Mosquitoes and flies. Ticks and fleas. Ants. Kissing bugs. Chiggers. What are the signs or symptoms? In many cases, symptoms last for 2-4 days. However, itching can last up to 10 days. Symptoms include: Itching or pain in the bite area. Redness and swelling in the bite area. An open wound (skin ulcer). In rare cases, a person may have a severe allergic reaction (anaphylactic reaction) to a bite. Symptoms of an anaphylactic reaction may include: Feeling warm in the face (flushed). This may include redness. Itchy, red, swollen areas of skin (hives). Swelling of the eyes, lips, face, mouth, tongue, or throat. Wheezing or difficulty breathing, speaking, or swallowing. Dizziness, light-headedness, or fainting. Abdominal symptoms like cramping, nausea, vomiting, or diarrhea. How is this diagnosed? This condition is usually diagnosed based on symptoms and a physical exam. During the exam, your health care provider will look at the bite and ask you what kind of insect bit you. How is this treated? Most insect bites are not serious. Symptoms often go away on their own and treatment is not usually needed. When treatment is recommended, it may include: Applying ice to the affected area. Applying steroid or other anti-itch creams, like calamine lotion, to the bite area. Medicines called antihistamines to reduce itching. You may also need: A tetanus shot if you are not up to date. Antibiotic cream or an oral antibiotic if the bite becomes infected (this is uncommon). Follow these instructions at home: Bite area care  Do not scratch the bite area. It may help to cover the bite area with a bandage or close-fitting clothing. Keep the bite area clean and dry. Wash it every day with soap and water as  told by your health care provider. Check the bite area every day for signs of infection. Check for: More redness, swelling, or pain. Fluid or blood. Warmth. Pus or a bad smell. Managing pain, itching, and swelling  You may apply cortisone cream, calamine lotion, or a paste made of baking soda and water to the bite area as told by your health care provider. If directed, put ice on the bite area. To do this: Put ice in a plastic bag. Place a towel between your skin and the bag. Leave the ice on for 20 minutes, 2-3 times a day. If your skin turns bright red, remove the ice right away to prevent skin damage. The risk of skin damage is higher if you cannot feel pain, heat, or cold. General instructions Apply or take over-the-counter and prescription medicine only as told by your health care provider. If you were prescribed antibiotics, take or apply them as told by your health care provider.  Do not stop using the antibiotic even if you start to feel better. How is this prevented? To help reduce your risk of insect bites: When you are outdoors, wear clothing that covers your arms and legs. This is especially important in the early morning and evening. Use insect repellent. The best insect repellents contain DEET, picaridin, oil of lemon eucalyptus (OLE), or IR3535. Consider spraying your clothing with a pesticide called permethrin. Permethrin helps prevent insect bites. It works for several weeks and for up to 5-6 clothing washes. Do not apply permethrin directly to the skin. If your home windows do not have screens, consider installing them. If you will be sleeping in an area where there are mosquitoes, consider covering your sleeping area with a mosquito net. Contact a health care provider if: Your bite area has signs of infection, such as: More redness, swelling, or pain. Fluid or blood. Warmth. Pus or a bad smell. You have a fever. Get help right away if: You have a rash. You have  muscle or joint pain. You feel unusually tired or weak. You have neck pain or a headache. You develop symptoms of an anaphylactic reaction. These may include: Swelling of the eyes, lips, face, mouth, tongue, or throat. Flushed skin or hives. Wheezing. Difficulty breathing, speaking, or swallowing. Dizziness, light-headedness, or fainting. Abdominal pain, cramping, vomiting, or diarrhea. These symptoms may be an emergency. Get help right away. Call 911. Do not wait to see if the symptoms will go away. Do not drive yourself to the hospital. Summary An insect bite can make your skin red, itchy, and swollen. Treatment is usually not needed. Symptoms often go away on their own. When treatment is recommended, it may involve taking medicine, applying medicine to the area, or applying ice. Apply or take over-the-counter and prescription medicines only as told by your health care provider. Use insect repellent to help prevent insect bites. Contact a health care provider if your bite area has signs of infection. This information is not intended to replace advice given to you by your health care provider. Make sure you discuss any questions you have with your health care provider. Document Revised: 01/10/2022 Document Reviewed: 12/26/2021 Elsevier Patient Education  2024 Elsevier Inc.   If you have been instructed to have an in-person evaluation today at a local Urgent Care facility, please use the link below. It will take you to a list of all of our available [facility], including address, phone number and hours of operation. Please do not delay care.  [facility]  If you or a family member do not have a primary care provider, use the link below to schedule a visit and establish care. When you choose a [facility] primary care physician or advanced practice provider, you gain a long-term partner in health. Find a Primary Care Provider  Learn more about [facility]  in-office and virtual care options: [facility] - Get Care Now

## 2024-02-04 NOTE — Progress Notes (Signed)
 Virtual Visit Consent   Margaret Cline, you are scheduled for a virtual visit with a [facility] provider today. Just as with appointments in the office, your consent must be obtained to participate. Your consent will be active for this visit and any virtual visit you may have with one of our providers in the next 365 days. If you have a MyChart account, a copy of this consent can be sent to you electronically.  As this is a virtual visit, video technology does not allow for your provider to perform a traditional examination. This may limit your provider's ability to fully assess your condition. If your provider identifies any concerns that need to be evaluated in person or the need to arrange testing (such as labs, EKG, etc.), we will make arrangements to do so. Although advances in technology are sophisticated, we cannot ensure that it will always work on either your end or our end. If the connection with a video visit is poor, the visit may have to be switched to a telephone visit. With either a video or telephone visit, we are not always able to ensure that we have a secure connection.  By engaging in this virtual visit, you consent to the provision of healthcare and authorize for your insurance to be billed (if applicable) for the services provided during this visit. Depending on your insurance coverage, you may receive a charge related to this service.  I need to obtain your verbal consent now. Are you willing to proceed with your visit today? Margaret Cline has provided verbal consent on 02/04/2024 for a virtual visit (video or telephone). Angelia Kelp, PA-C  Date: 02/04/2024 1:38 PM   Virtual Visit via Video Note   I, Angelia Kelp, connected with  Margaret Cline  ([id number], [birth year]) on 02/04/24 at  1:30 PM EDT by a video-enabled telemedicine application and verified that I am speaking with the correct person using two identifiers.  Location: Patient: Virtual Visit Location  Patient: Home Provider: Virtual Visit Location Provider: Home Office   I discussed the limitations of evaluation and management by telemedicine and the availability of in person appointments. The patient expressed understanding and agreed to proceed.    History of Present Illness: Margaret Cline is a 33 y.o. who identifies as a female who was assigned female at birth, and is being seen today for insect bite.  HPI: HPI  Started Sunday night noticed that she was having intense itching on the left ankle medial aspect. By Monday morning it was swollen, itching, red, and warm to touch. Now today has progressed to be more tender to touch, having pain in muscles of the left lower leg and around the left ankle. Pain with walking or movements of the left ankle. Still having redness, burning, itching. Swelling has improved some but still present.No drainage. No fevers, chills, nausea, vomiting, red streaking from bite.  Does have PMH of HS and was just on Doxycycline  100mg  BID x 10 days, prescribed on 01/21/24.  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   Cigarette nicotine dependence without complication 02/23/2015   BMI 33.0-33.9,adult 02/23/2015   Major depression, recurrent (HCC) 08/23/2013   Suicide attempt by multiple drug overdose (HCC) 08/23/2013   Bacterial vaginosis 05/08/2012   Asthma 03/20/2012   Obesity 03/07/2012    Allergies:  Allergies  Allergen Reactions   Prednisone  Nausea And Vomiting and Hives   Medications:  Current Outpatient Medications:    cephALEXin  (KEFLEX ) 500 MG capsule, Take 1 capsule (500 mg total) by mouth 4 (four) times daily., Disp: 20 capsule, Rfl: 0   mupirocin  ointment (BACTROBAN ) 2 %, Apply 1 Application topically 2 (two) times daily., Disp: 22 g, Rfl: 0   triamcinolone  cream (KENALOG ) 0.1 %,  Apply 1 Application topically 2 (two) times daily., Disp: 30 g, Rfl: 0   acetaminophen  (TYLENOL ) 500 MG tablet, Take 1,000 mg by mouth every 8 (eight) hours as needed for headache., Disp: , Rfl:    albuterol  (PROAIR  HFA) 108 (90 Base) MCG/ACT inhaler, Inhale 2 puffs into the lungs every 4 (four) hours as needed for wheezing or shortness of breath. For shortness of breath/wheezing, Disp: 18 g, Rfl: 1   albuterol  (PROVENTIL ) (2.5 MG/3ML) 0.083% nebulizer solution, Take 3 mLs (2.5 mg total) by nebulization every 4 (four) hours as needed for wheezing or shortness of breath., Disp: 30 vial, Rfl: 0   baclofen  (LIORESAL ) 10 MG tablet, Take 1 tablet (10 mg total) by mouth 2 (two) times daily as needed for muscle spasms., Disp: 30 each, Rfl: 0   dicyclomine  (BENTYL ) 20 MG tablet, Take 1 tablet (20 mg total) by mouth 3 (three) times daily as needed for spasms. (Patient not taking: Reported on 10/30/2023), Disp: 20 tablet, Rfl: 0   lisinopril (ZESTRIL) 5 MG tablet, Take 5 mg by mouth daily., Disp: , Rfl:    loratadine  (CLARITIN ) 10 MG tablet, Take 1 tablet (10 mg total) by mouth daily., Disp: 20 tablet, Rfl: 0   meloxicam  (MOBIC ) 15 MG tablet, Take 1 tablet (15 mg total) by mouth daily. (Patient not taking: Reported on 10/30/2023), Disp: 30 tablet, Rfl: 0   ondansetron  (ZOFRAN ) 4 MG tablet, Take 1 tablet (4 mg total) by mouth every 8 (eight) hours as needed for nausea or vomiting. (Patient not taking: Reported on 10/30/2023), Disp: 20 tablet, Rfl: 0   ondansetron  (ZOFRAN -ODT) 4 MG disintegrating tablet, Take 1 tablet (4 mg total) by mouth every 8 (eight) hours as needed. (Patient not taking: Reported on 10/30/2023), Disp: 20 tablet, Rfl: 0  Observations/Objective: Patient is well-developed, well-nourished in no acute distress.  Resting comfortably at home.  Head is normocephalic, atraumatic.  No labored breathing.  Speech is clear and coherent with logical content.  Patient is alert and oriented at baseline.   There is a small bite wound on the medial left ankle just distal to the medial malleolus with surrounding erythema  Assessment and Plan: 1. Wound cellulitis (Primary) - cephALEXin  (KEFLEX ) 500 MG capsule; Take 1 capsule (500 mg total) by mouth 4 (four) times daily.  Dispense: 20 capsule; Refill: 0 - triamcinolone  cream (KENALOG ) 0.1 %; Apply 1 Application topically 2 (two) times daily.  Dispense: 30 g; Refill: 0 - mupirocin  ointment (BACTROBAN ) 2 %; Apply 1 Application topically 2 (two) times daily.  Dispense: 22 g; Refill: 0  2. Insect bite of left ankle, initial encounter - cephALEXin  (KEFLEX ) 500 MG capsule; Take 1 capsule (500 mg total) by mouth 4 (four) times daily.  Dispense: 20 capsule; Refill: 0 - triamcinolone  cream (KENALOG ) 0.1 %; Apply 1 Application topically 2 (two) times daily.  Dispense: 30 g; Refill: 0 - mupirocin  ointment (BACTROBAN ) 2 %; Apply 1 Application topically 2 (two) times daily.  Dispense: 22 g; Refill: 0  - Suspect secondary infection due to insect bite - Add Keflex  - Topical Mupirocin  and Triamcinolone  -  Cold compresses - Elevate leg - Monitor for worsening symptoms - Seek in person evaluation if worsens or fails to improve  Follow Up Instructions: I discussed the assessment and treatment plan with the patient. The patient was provided an opportunity to ask questions and all were answered. The patient agreed with the plan and demonstrated an understanding of the instructions.  A copy of instructions were sent to the patient via MyChart unless otherwise noted below.    The patient was advised to call back or seek an in-person evaluation if the symptoms worsen or if the condition fails to improve as anticipated.    Angelia Kelp, PA-C

## 2024-02-12 ENCOUNTER — Ambulatory Visit: Admitting: Podiatrist

## 2024-02-12 ENCOUNTER — Encounter: Payer: Self-pay | Admitting: Podiatrist

## 2024-02-12 DIAGNOSIS — D492 Neoplasm of unspecified behavior of bone, soft tissue, and skin: Secondary | ICD-10-CM

## 2024-02-12 DIAGNOSIS — D2372 Other benign neoplasm of skin of left lower limb, including hip: Secondary | ICD-10-CM

## 2024-02-12 NOTE — Progress Notes (Signed)
 Chief Complaint  Patient presents with   Callouses    Callouse under left and under left 5th toe. Had for 2 yrs. Debridement and soaking. 5 pain scale. Mostly with standing.     HPI: Patient is 33 y.o. female who presents today for painful soft tissue lesions submetatarsal 4 of the left foot and plantar pinky toe left.  She relates she has trimmed the area with some relief in pain.  Wants to see if there are any other treatment recommendations.   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     albuterol  (PROAIR  HFA) 108 (90 Base) MCG/ACT inhaler Inhale 2 puffs into the lungs every 4 (four) hours as needed for wheezing or shortness of breath. For shortness of breath/wheezing 18 g 1   albuterol  (PROVENTIL ) (2.5 MG/3ML) 0.083% nebulizer solution Take 3 mLs (2.5 mg total) by nebulization every 4 (four) hours as needed for wheezing or shortness of breath. 30 vial 0   baclofen  (LIORESAL ) 10 MG tablet Take 1 tablet (10 mg total) by mouth 2 (two) times daily as needed for muscle spasms. 30 each 0   cephALEXin  (KEFLEX ) 500 MG capsule Take 1 capsule (500 mg total) by mouth 4 (four) times daily. 20 capsule 0   dicyclomine  (BENTYL ) 20 MG tablet Take 1 tablet (20 mg total) by mouth 3 (three) times daily as needed for spasms. (Patient not taking: Reported on 10/30/2023) 20 tablet 0    lisinopril (ZESTRIL) 5 MG tablet Take 5 mg by mouth daily.     loratadine  (CLARITIN ) 10 MG tablet Take 1 tablet (10 mg total) by mouth daily. 20 tablet 0   meloxicam  (MOBIC ) 15 MG tablet Take 1 tablet (15 mg total) by mouth daily. (Patient not taking: Reported on 10/30/2023) 30 tablet 0   mupirocin  ointment (BACTROBAN ) 2 % Apply 1 Application topically 2 (two) times daily. 22 g 0   No current facility-administered medications on file prior to visit.    Allergies  Allergen Reactions   Prednisone  Nausea And Vomiting and Hives    Review of Systems No fevers, chills, nausea, muscle aches, no difficulty breathing, no calf pain, no chest pain or shortness of breath.   Physical Exam  GENERAL APPEARANCE: Alert, conversant. Appropriately groomed. No acute distress.   VASCULAR: Pedal pulses palpable 2/4 DP and  PT bilateral.  Capillary refill time is immediate to all digits,  Proximal to distal cooling is warm to warm.  Digital perfusion adequate.   NEUROLOGIC: sensation is intact to 5.07 monofilament at 5/5 sites bilateral.  Light touch is intact bilateral, vibratory sensation intact bilateral  MUSCULOSKELETAL: acceptable muscle strength, tone and stability bilateral.  No gross boney pedal deformities noted.  No pain, crepitus or limitation noted with foot and ankle range of motion bilateral.   DERMATOLOGIC: skin is warm, supple, and dry.  Porokeratotic lesion submetatarsal 4 left foot identified.  It has a ground glass appearance in the central core of the lesion.  Small poro also plantar fifth toe noted.    Assessment     ICD-10-CM   1. Benign neoplasm of skin of left foot  D23.72        Plan  Recommended treatment options and discussed paring the lesions.  This was, compleated today with a #15 blade without complication.  Salinocaine was added to the base of the lesions and aperture padding was dispensed.  Discussed shoe gear changes to a cushioned tennis shoe or sneaker.  Also  recommended at home debridement with a pumice stone.  Discussed if this fails to relieve her pain she will call otherwise should be seen back as needed for follow-up.

## 2024-04-06 ENCOUNTER — Other Ambulatory Visit: Payer: Self-pay | Admitting: Obstetrics and Gynecology

## 2024-04-06 DIAGNOSIS — N760 Acute vaginitis: Secondary | ICD-10-CM

## 2024-08-05 ENCOUNTER — Ambulatory Visit: Admitting: Obstetrics and Gynecology

## 2024-08-07 ENCOUNTER — Other Ambulatory Visit (HOSPITAL_COMMUNITY)
Admission: RE | Admit: 2024-08-07 | Discharge: 2024-08-07 | Disposition: A | Discharge status: Home / Self Care | Source: Ambulatory Visit | Attending: Obstetrics and Gynecology | Admitting: Obstetrics and Gynecology

## 2024-08-07 ENCOUNTER — Encounter: Payer: Self-pay | Admitting: Obstetrics and Gynecology

## 2024-08-07 ENCOUNTER — Ambulatory Visit: Admitting: Obstetrics and Gynecology

## 2024-08-07 ENCOUNTER — Other Ambulatory Visit: Payer: Self-pay

## 2024-08-07 VITALS — BP 102/70 | HR 84 | Wt 193.6 lb

## 2024-08-07 DIAGNOSIS — Z01419 Encounter for gynecological examination (general) (routine) without abnormal findings: Secondary | ICD-10-CM

## 2024-08-07 DIAGNOSIS — Z3009 Encounter for other general counseling and advice on contraception: Secondary | ICD-10-CM

## 2024-08-07 DIAGNOSIS — Z3046 Encounter for surveillance of implantable subdermal contraceptive: Secondary | ICD-10-CM | POA: Diagnosis not present

## 2024-08-07 DIAGNOSIS — Z113 Encounter for screening for infections with a predominantly sexual mode of transmission: Secondary | ICD-10-CM | POA: Diagnosis present

## 2024-08-07 DIAGNOSIS — N951 Menopausal and female climacteric states: Secondary | ICD-10-CM | POA: Diagnosis not present

## 2024-08-07 MED ORDER — ETONOGESTREL 68 MG ~~LOC~~ IMPL: 68.0000 mg | DRUG_IMPLANT | Freq: Once | SUBCUTANEOUS | Status: DC

## 2024-08-07 MED ORDER — ETONOGESTREL 68 MG ~~LOC~~ IMPL
68.0000 mg | DRUG_IMPLANT | Freq: Once | SUBCUTANEOUS | Status: AC
  Administered 2024-08-07: 68 mg via SUBCUTANEOUS

## 2024-08-07 NOTE — Progress Notes (Signed)
 ANNUAL EXAM Patient name: Margaret Cline MRN [id number]  Date of birth: July 26, 1991 Chief Complaint:   Gynecologic Exam and nexplanon  removal and reinsertion  History of Present Illness:   Margaret Cline is a 33 y.o. [id number] being seen today for a routine annual exam.  Current complaints: switch contraception, vasomotor symptoms, STI testing  Menstrual concerns? No - no bleeding but has cramping when her daughter has a menses Breast or nipple changes? No  Contraception use? Yes nexplanon , due for exchange. Increased migraines when due for exchange but denies hx of menstrual migraines. Also notes has completed childbearing and interested in tubal ligation and was told at prior office could not have them due to her age and would regret it but she is certain that she is done having children.  Sexually active? Yes   Healing from recent HS surgery on her left leg.   Reports having hot flashes- mother had them at this age and wold like more informatin. Also having increased mood swings and wondering if due to perimenopause and requesting more information.    No LMP recorded. Patient has had an implant.   Upstream - 08/07/24 0857       Pregnancy Intention Screening   Does the patient want to become pregnant in the next year? No    Does the patient's partner want to become pregnant in the next year? No    Would the patient like to discuss contraceptive options today? No      Contraception Wrap Up   Current Method Hormonal Implant    End Method Hormonal Implant    Contraception Counseling Provided No    How was the end contraceptive method provided? N/A         The pregnancy intention screening data noted above was reviewed. Potential methods of contraception were discussed. The patient elected to proceed with Hormonal Implant.   Last pap     Component Value Date/Time   DIAGPAP  07/18/2023 1628    - Negative for Intraepithelial Lesions or Malignancy (NILM)   DIAGPAP - Benign  reactive/reparative changes 07/18/2023 1628   DIAGPAP  12/10/2016 0000    NEGATIVE FOR INTRAEPITHELIAL LESIONS OR MALIGNANCY.   HPVHIGH Negative 07/18/2023 1628   ADEQPAP  07/18/2023 1628    Satisfactory for evaluation; transformation zone component PRESENT.   ADEQPAP  12/10/2016 0000    Satisfactory for evaluation  endocervical/transformation zone component PRESENT.    Last mammogram: n/a.  Last colonoscopy: n/a.      08/07/2024    8:52 AM 11/20/2023    1:30 PM 10/30/2023    4:04 PM 07/19/2023   11:30 AM 03/19/2022    4:44 PM  Depression screen PHQ 2/9  Decreased Interest 1 1 1 3  0  Down, Depressed, Hopeless 1 1 1 2 1   PHQ - 2 Score 2 2 2 5 1   Altered sleeping 2 1 1 2 1   Tired, decreased energy 2 1 1 3 1   Change in appetite 1 1 2 1 1   Feeling bad or failure about yourself  1 1 1 2  0  Trouble concentrating 0 0 0 0 0  Moving slowly or fidgety/restless 1 0 0 0 0  Suicidal thoughts 0 0 0 0 0  PHQ-9 Score 9 6 7 13 4         08/07/2024    8:52 AM 11/20/2023    1:30 PM 10/30/2023    4:05 PM 07/19/2023   11:30 AM  GAD 7 : Generalized Anxiety Score  Nervous, Anxious, on Edge 2 1 2 2   Control/stop worrying 1 1 1 1   Worry too much - different things 2 1 1 1   Trouble relaxing 1 1 1 1   Restless 2 1 0 2  Easily annoyed or irritable 2 1 1 1   Afraid - awful might happen 1 1 1  0  Total GAD 7 Score 11 7 7 8      Review of Systems:   Pertinent items are noted in HPI Denies any headaches, blurred vision, fatigue, shortness of breath, chest pain, abdominal pain, abnormal vaginal discharge/itching/odor/irritation, problems with periods, bowel movements, urination, or intercourse unless otherwise stated above. Pertinent History Reviewed:  Reviewed past medical,surgical, social and family history.  Reviewed problem list, medications and allergies. Physical Assessment:   Vitals:   08/07/24 0851  BP: 102/70  Pulse: 84  Weight: 193 lb 9.6 oz (87.8 kg)  Body mass index is 29.44 kg/m.         Physical Examination:   General appearance - well appearing, and in no distress  Mental status - alert, oriented to person, place, and time  Psych:  She has a normal mood and affect  Skin - warm and dry, normal color, no suspicious lesions noted  Chest - effort normal, all lung fields clear to auscultation bilaterally  Abdomen - soft, nontender, nondistended, no masses or organomegaly  Pelvic - deferred  Extremities:  No swelling or varicosities noted  Chaperone present for exam  No results found for this or any previous visit (from the past 24 hours).   Nexplanon  Removal and Reinsertion Patient identified, informed consent performed, consent signed.   Patient does understand that irregular bleeding is a very common side effect of this medication. She was advised to have backup contraception for one week after replacement of the implant. Pregnancy test in clinic today was negative.  Appropriate time out taken. Nexplanon  site identified in left arm.  Area prepped in usual sterile fashon. Three ml of 2% lidocaine  with epinephrine  was used to anesthetize the area at the distal end of the implant. A small stab incision was made right beside the implant on the distal portion. The Nexplanon  rod was grasped and removed without difficulty. There was minimal blood loss. There were no complications. She was re-prepped with betadine, Nexplanon  removed from packaging, Device confirmed in needle, then inserted full length of needle and withdrawn per handbook instructions. Nexplanon  was able to palpated in the patient's arm; patient palpated the insert herself.  There was minimal blood loss. Patient insertion site covered with gauze and a pressure bandage to reduce any bruising. The patient tolerated the procedure well and was given post procedure instructions.  She was advised to have backup contraception for one w   Assessment & Plan:   1. Well woman exam with routine gynecological exam (Primary) -  Cervical cancer screening: Discussed guidelines. Pap with HPV due 2027-2029 - GC/CT: accepts - Birth Control: Nexplanon  - Breast Health: Encouraged self breast awareness/SBE. Teaching provided.  - F/U 12 months and prn   2. Screening examination for STI collected - RPR+HBsAg+HCVAb+... - Cervicovaginal ancillary only  3. Encounter for counseling regarding contraception Briefly reviewed that if certain that she has completed childbearing and would like to discuss further, can return to have discussion about sterilization vs continuation of nexplanon  for continuation. Noted that there would likely be time between counseling and scheduling and that sterilization is not reversible and would not change cycles and that if menstrual management desired, hormonal intervention  would need to be continued.    4. Encounter for removal and reinsertion of Nexplanon  Now s/p uncomplicated exchanged. Given post-procedure care instructions. - etonogestrel  (NEXPLANON ) implant 68 mg  5. Perimenopausal vasomotor symptoms Will give information about VMS and that hot flashes can start years prior to final menstrual period. Can discuss further at follow up.   Orders Placed This Encounter  Procedures   RPR+HBsAg+HCVAb+...    Meds:  Meds ordered this encounter  Medications   etonogestrel  (NEXPLANON ) implant 68 mg    Follow-up: No follow-ups on file.  Carter Quarry, MD 08/07/2024 9:20 AM

## 2024-08-07 NOTE — Patient Instructions (Addendum)
 Insertion Aftercare you just got NEXPLANON  Here is some helpful information to keep you informed. And if you have any questions, please consult your doctor. 24 Hours wear your top bandage The compression bandage helps minimize bruising.  3-5 Days keep your implant site covered While the insertion site is healing, keep the area covered with a smaller bandage.   [email or web address]  Hot Flashes and Night Sweats  Hot flashes and night sweats, also called vasomotor symptoms, are feelings of warmth that can be associated with flushing and sweating. They are quite common during menopause, occurring in up to 80% of women and lasting a mean of 7 to 10 years. They may also contribute to sleep and mood issues that can negatively affect quality of life.   Hot flashes are characterized by a sudden, intense sensation of heat in the upper body--particularly the face, neck, and chest. Each hot flash episode typically lasts between 1 and 5 minutes and may be accompanied by sweating, chills, and anxiety. Some women can feel a rapid heartbeat at the same time. Night sweats are hot flashes that occur during sleep. Chills can also occur, resulting in shivering.    Hot flashes can range from mild and tolerable to severe and debilitating. Hot flashes that are mild may be experienced as a sensation of heat without sweating. With severe hot flashes, there can be enough sensation of heat and sweating to make a woman have to stop activity.    Risk Factors Beyond ethnicity, certain other risk factors increase the likelihood of experiencing hot flashes. A higher level of abdominal fat has been shown to increase the likelihood of hot flashes in younger women and women earlier in the menopause transition. Both current and past cigarette smoking also increases the risk of hot flashes.    For women who undergo surgical menopause (removal of their ovaries before menopause),  hot flashes often begin right after surgery. These women are more likely to have hot flashes than are women who experience menopause naturally, and their symptoms also tend to be more frequent and severe.   Treatments for Hot Flashes Certain low-risk lifestyle measures and nonprescription strategies can help to manage hot flashes and night sweats. If hot flashes remain significantly disruptive despite using these approaches, then prescription hormone therapy or nonhormone prescription drugs can be considered:   Weight loss  Cognitive-behavioral therapy  Clinical hypnosis  Other measures are sometimes encouraged or tried for hot flashes but have limited solid data available to evaluate their effectiveness--or they have failed to show significant benefits in clinical trials:   Acupuncture  Chiropractic interventions  Cooling techniques, such as adjusting clothing or environmental temperature  Dietary modifications  Dietary supplements such as ammonium succinate, black cohosh, cannabinoids such as marijuana, chasteberry, dong quai, evening primrose, ginseng, Lactobacillus acidophilus, maca, milk thistle, omega-3 fatty acid, pollen extract, rhubarb, vitamin E, and wild yam (dioscorea)  Exercise and yoga  Mindfulness-based interventions  Paced breathing  Relaxation techniques  Soy foods, soy extracts, and soy metabolite equol (S-equol)  Trigger avoidance, such as alcohol, caffeine , spicy foods, or hot foods  That's not to say that none of these measures have value. In particular, lifestyle measures such as a healthy diet, regular exercise, and stress management through relaxation techniques may play a vital role in chronic disease prevention and a woman's overall health.    There are only two nonhormone medications approved by FDA to treat hot flashes. One is a low dose of paroxetine, an antidepressant called a selective  serotonin-reuptake inhibitor. Another is fezolinetant, which belongs to the  neurokinin B antagonist class, developed specifically to target hot flashes. In addition, other nonhormone medications for other health conditions have been used off-label to treat hot flashes because they were shown to have some benefit. These include   Certain antidepressants  Gabapentin  Oxybutynin  Hormone therapy is FDA approved as a first-line therapy for relief of hot flashes. It's shown to be the most effective treatment for bothersome hot flashes. In particular, the benefits particularly outweigh the risks for hormone therapy when used in early menopause to relieve hot flashes, night sweats, and sleep disturbances. For these vasomotor symptoms, estrogen therapy is given as pills, patches, sprays, gels, or as a vaginal ring. These products deliver estrogen throughout the body--known as systemic therapy. Systemic doses are absorbed into the bloodstream at high-enough levels to have significant effects in widespread areas, which is needed to treat hot flashes.

## 2024-08-08 LAB — RPR+HBSAG+HCVAB+...
HIV Screen 4th Generation wRfx: NONREACTIVE
Hep C Virus Ab: NONREACTIVE
Hepatitis B Surface Ag: NEGATIVE
RPR Ser Ql: NONREACTIVE

## 2024-08-10 LAB — CERVICOVAGINAL ANCILLARY ONLY
Chlamydia: NEGATIVE
Comment: NEGATIVE
Comment: NEGATIVE
Comment: NORMAL
Neisseria Gonorrhea: NEGATIVE
Trichomonas: NEGATIVE

## 2024-08-11 ENCOUNTER — Ambulatory Visit: Payer: Self-pay | Admitting: Obstetrics and Gynecology

## 2024-09-01 NOTE — Progress Notes (Signed)
 ICD-10-CM   1. Surgical followup  Z09        Margaret Cline is post-op:   07/27/2024- PROCEDURE PERFORMED: Excision of left inguinal hidradenitis and complex closure.   Presenting for routine follow-up.    S:  Patient has noted no excessive redness, swelling or pain.    O:  BP (!) 97/54   Pulse 82   Temp 98.2 F (36.8 C) (Temporal)   Ht 1.702 m (5' 7)   Wt 87.5 kg (193 lb)   BMI 30.23 kg/m   General: NAD, alert and well-appearing   Left groin - open wound, no gross erythema or edema, no drainage    A:  Satisfactory course.    P: return to clinic in 2-3 weeks   Patient is to return as needed for redness, swelling, discomfort, or any concern about her  surgery.    Electronically signed by: Burnard Earnie Finder, FNP 09/01/2024 9:56 AM

## 2024-09-16 ENCOUNTER — Encounter (HOSPITAL_BASED_OUTPATIENT_CLINIC_OR_DEPARTMENT_OTHER): Attending: General Surgery | Admitting: General Surgery

## 2024-09-16 DIAGNOSIS — L98492 Non-pressure chronic ulcer of skin of other sites with fat layer exposed: Secondary | ICD-10-CM | POA: Insufficient documentation

## 2024-09-16 DIAGNOSIS — Y838 Other surgical procedures as the cause of abnormal reaction of the patient, or of later complication, without mention of misadventure at the time of the procedure: Secondary | ICD-10-CM | POA: Insufficient documentation

## 2024-09-16 DIAGNOSIS — T8131XA Disruption of external operation (surgical) wound, not elsewhere classified, initial encounter: Secondary | ICD-10-CM | POA: Insufficient documentation

## 2024-09-30 ENCOUNTER — Encounter (HOSPITAL_BASED_OUTPATIENT_CLINIC_OR_DEPARTMENT_OTHER): Admitting: General Surgery

## 2024-10-09 ENCOUNTER — Encounter (HOSPITAL_BASED_OUTPATIENT_CLINIC_OR_DEPARTMENT_OTHER): Admitting: General Surgery

## 2024-10-12 ENCOUNTER — Encounter (HOSPITAL_BASED_OUTPATIENT_CLINIC_OR_DEPARTMENT_OTHER): Admitting: General Surgery

## 2024-10-12 DIAGNOSIS — T8131XA Disruption of external operation (surgical) wound, not elsewhere classified, initial encounter: Secondary | ICD-10-CM | POA: Diagnosis not present
